# Patient Record
Sex: Male | Born: 1965 | Race: Black or African American | Hispanic: No | Marital: Married | Smoking: Never smoker
Health system: Southern US, Community
[De-identification: ages and names within clinical notes are randomized; demographics above are authoritative.]

## PROBLEM LIST (undated history)

## (undated) DIAGNOSIS — R972 Elevated prostate specific antigen [PSA]: Secondary | ICD-10-CM

## (undated) DIAGNOSIS — R112 Nausea with vomiting, unspecified: Secondary | ICD-10-CM

## (undated) DIAGNOSIS — J45990 Exercise induced bronchospasm: Secondary | ICD-10-CM

## (undated) DIAGNOSIS — N201 Calculus of ureter: Secondary | ICD-10-CM

## (undated) DIAGNOSIS — N529 Male erectile dysfunction, unspecified: Secondary | ICD-10-CM

## (undated) DIAGNOSIS — I10 Essential (primary) hypertension: Secondary | ICD-10-CM

## (undated) DIAGNOSIS — Z87820 Personal history of traumatic brain injury: Secondary | ICD-10-CM

## (undated) DIAGNOSIS — E039 Hypothyroidism, unspecified: Secondary | ICD-10-CM

## (undated) DIAGNOSIS — K219 Gastro-esophageal reflux disease without esophagitis: Secondary | ICD-10-CM

## (undated) DIAGNOSIS — T4145XA Adverse effect of unspecified anesthetic, initial encounter: Secondary | ICD-10-CM

## (undated) DIAGNOSIS — T8859XA Other complications of anesthesia, initial encounter: Secondary | ICD-10-CM

## (undated) DIAGNOSIS — Z9889 Other specified postprocedural states: Secondary | ICD-10-CM

## (undated) DIAGNOSIS — G8929 Other chronic pain: Secondary | ICD-10-CM

## (undated) DIAGNOSIS — N200 Calculus of kidney: Secondary | ICD-10-CM

## (undated) DIAGNOSIS — M549 Dorsalgia, unspecified: Secondary | ICD-10-CM

## (undated) DIAGNOSIS — G4730 Sleep apnea, unspecified: Secondary | ICD-10-CM

## (undated) DIAGNOSIS — Z87442 Personal history of urinary calculi: Secondary | ICD-10-CM

## (undated) HISTORY — PX: CARDIOVASCULAR STRESS TEST: SHX262

## (undated) HISTORY — PX: VARICOCELECTOMY: SHX1084

## (undated) HISTORY — PX: CARDIAC CATHETERIZATION: SHX172

## (undated) HISTORY — PX: PROSTATE BIOPSY: SHX241

---

## 1998-06-28 ENCOUNTER — Ambulatory Visit (HOSPITAL_COMMUNITY): Admission: RE | Admit: 1998-06-28 | Discharge: 1998-06-28 | Payer: Self-pay | Admitting: Internal Medicine

## 1998-07-18 ENCOUNTER — Ambulatory Visit (HOSPITAL_COMMUNITY): Admission: RE | Admit: 1998-07-18 | Discharge: 1998-07-18 | Payer: Self-pay | Admitting: Urology

## 1999-06-05 ENCOUNTER — Ambulatory Visit (HOSPITAL_COMMUNITY): Admission: RE | Admit: 1999-06-05 | Discharge: 1999-06-05 | Payer: Self-pay | Admitting: Internal Medicine

## 1999-08-17 ENCOUNTER — Emergency Department (HOSPITAL_COMMUNITY): Admission: EM | Admit: 1999-08-17 | Discharge: 1999-08-17 | Payer: Self-pay | Admitting: Emergency Medicine

## 1999-08-17 ENCOUNTER — Encounter: Payer: Self-pay | Admitting: Emergency Medicine

## 2001-12-26 ENCOUNTER — Encounter: Admission: RE | Admit: 2001-12-26 | Discharge: 2001-12-26 | Payer: Self-pay | Admitting: Internal Medicine

## 2001-12-26 ENCOUNTER — Encounter: Payer: Self-pay | Admitting: Internal Medicine

## 2002-06-21 ENCOUNTER — Emergency Department (HOSPITAL_COMMUNITY): Admission: EM | Admit: 2002-06-21 | Discharge: 2002-06-21 | Payer: Self-pay | Admitting: Emergency Medicine

## 2002-06-22 ENCOUNTER — Emergency Department (HOSPITAL_COMMUNITY): Admission: EM | Admit: 2002-06-22 | Discharge: 2002-06-22 | Payer: Self-pay | Admitting: Emergency Medicine

## 2002-06-22 ENCOUNTER — Encounter: Payer: Self-pay | Admitting: Emergency Medicine

## 2003-05-19 ENCOUNTER — Emergency Department (HOSPITAL_COMMUNITY): Admission: EM | Admit: 2003-05-19 | Discharge: 2003-05-19 | Payer: Self-pay | Admitting: Emergency Medicine

## 2003-05-19 ENCOUNTER — Encounter: Payer: Self-pay | Admitting: Emergency Medicine

## 2003-08-19 ENCOUNTER — Emergency Department (HOSPITAL_COMMUNITY): Admission: EM | Admit: 2003-08-19 | Discharge: 2003-08-19 | Payer: Self-pay | Admitting: Emergency Medicine

## 2003-11-06 ENCOUNTER — Ambulatory Visit (HOSPITAL_BASED_OUTPATIENT_CLINIC_OR_DEPARTMENT_OTHER): Admission: RE | Admit: 2003-11-06 | Discharge: 2003-11-06 | Payer: Self-pay | Admitting: Internal Medicine

## 2005-03-27 ENCOUNTER — Inpatient Hospital Stay (HOSPITAL_COMMUNITY): Admission: EM | Admit: 2005-03-27 | Discharge: 2005-03-31 | Payer: Self-pay | Admitting: Emergency Medicine

## 2005-06-03 ENCOUNTER — Emergency Department (HOSPITAL_COMMUNITY): Admission: EM | Admit: 2005-06-03 | Discharge: 2005-06-03 | Payer: Self-pay | Admitting: Emergency Medicine

## 2007-05-29 ENCOUNTER — Emergency Department (HOSPITAL_COMMUNITY): Admission: EM | Admit: 2007-05-29 | Discharge: 2007-05-29 | Payer: Self-pay | Admitting: Family Medicine

## 2007-10-16 ENCOUNTER — Emergency Department (HOSPITAL_COMMUNITY): Admission: EM | Admit: 2007-10-16 | Discharge: 2007-10-16 | Payer: Self-pay | Admitting: Emergency Medicine

## 2007-10-19 ENCOUNTER — Emergency Department (HOSPITAL_COMMUNITY): Admission: EM | Admit: 2007-10-19 | Discharge: 2007-10-19 | Payer: Self-pay | Admitting: Family Medicine

## 2008-05-07 ENCOUNTER — Emergency Department (HOSPITAL_COMMUNITY): Admission: EM | Admit: 2008-05-07 | Discharge: 2008-05-07 | Payer: Self-pay | Admitting: Family Medicine

## 2009-09-08 ENCOUNTER — Emergency Department (HOSPITAL_COMMUNITY): Admission: EM | Admit: 2009-09-08 | Discharge: 2009-09-08 | Payer: Self-pay | Admitting: Emergency Medicine

## 2010-02-08 ENCOUNTER — Emergency Department (HOSPITAL_COMMUNITY): Admission: EM | Admit: 2010-02-08 | Discharge: 2010-02-08 | Payer: Self-pay | Admitting: Family Medicine

## 2010-06-28 ENCOUNTER — Encounter (INDEPENDENT_AMBULATORY_CARE_PROVIDER_SITE_OTHER): Payer: Self-pay | Admitting: Emergency Medicine

## 2010-06-28 ENCOUNTER — Emergency Department (HOSPITAL_COMMUNITY)
Admission: EM | Admit: 2010-06-28 | Discharge: 2010-06-28 | Payer: Self-pay | Source: Home / Self Care | Admitting: Emergency Medicine

## 2010-08-20 ENCOUNTER — Encounter: Payer: Self-pay | Admitting: Internal Medicine

## 2010-08-30 ENCOUNTER — Encounter: Payer: Self-pay | Admitting: Internal Medicine

## 2010-08-31 ENCOUNTER — Ambulatory Visit (INDEPENDENT_AMBULATORY_CARE_PROVIDER_SITE_OTHER): Payer: Commercial Managed Care - PPO

## 2010-08-31 DIAGNOSIS — J019 Acute sinusitis, unspecified: Secondary | ICD-10-CM

## 2010-10-10 LAB — POCT I-STAT, CHEM 8
BUN: 10 mg/dL (ref 6–23)
Calcium, Ion: 1.2 mmol/L (ref 1.12–1.32)
Creatinine, Ser: 1.2 mg/dL (ref 0.4–1.5)
TCO2: 30 mmol/L (ref 0–100)

## 2010-10-10 LAB — DIFFERENTIAL
Eosinophils Absolute: 0.1 10*3/uL (ref 0.0–0.7)
Eosinophils Relative: 1 % (ref 0–5)
Lymphs Abs: 1.9 10*3/uL (ref 0.7–4.0)
Monocytes Absolute: 0.3 10*3/uL (ref 0.1–1.0)

## 2010-10-10 LAB — CBC
Hemoglobin: 13.3 g/dL (ref 13.0–17.0)
MCH: 26.3 pg (ref 26.0–34.0)
MCHC: 32.6 g/dL (ref 30.0–36.0)
RDW: 17.5 % — ABNORMAL HIGH (ref 11.5–15.5)

## 2010-12-15 NOTE — Consult Note (Signed)
NAME:  Nicholas Mcintosh, Nicholas Mcintosh             ACCOUNT NO.:  [id number]   MEDICAL RECORD NO.:  [id number]          PATIENT TYPE:  INP   LOCATION:  0152                         FACILITY:  [facility]   PHYSICIAN:  Marlan Palau, M.D.  DATE OF BIRTH:  Aug 04, 1965   DATE OF CONSULTATION:  03/28/2005  DATE OF DISCHARGE:                                   CONSULTATION   HISTORY OF PRESENT ILLNESS:  Nicholas Mcintosh is a 45 year old black male  with a history of a work related event associated with a fall while up on a  ladder caulking a window.  The patient recalls nothing of the actual fall  and likely lost consciousness with the event.  Since that time, the patient  has had a severe headache and vertigo.  A CT scan of the head initially  looked unremarkable.  An MRI scan of the brain was done and shows no  evidence of contusions. The study is unremarkable.  The patient has been  getting pain medications of Darvocet, taking two every four hours or so  during this hospitalization.  The patient has also complained of a  significant amount of vertigo.  The patient gets Phenergan as needed for  nausea.  Around lunch time today, the patient was noted to be somewhat  lethargic and difficult to arouse.  The patient will respond when stimulated  but will fall asleep fairly rapidly if left alone.  Neurology was asked to  see this patient for further evaluation of the altered mental status.  The  patient reports no focal numbness or weakness on the arms or legs.   PAST MEDICAL HISTORY:  Significant for:  1.  History of a fall with a concussion.  2.  Post traumatic headache.  3.  Post traumatic vertigo.  4.  Hypertension.   ALLERGIES:  NO KNOWN ALLERGIES.   HABITS:  The patient does not smoke or drink.   MEDICATIONS:  He was on no medications prior to admission.  Currently he is  on:  1.  Flexeril 5 mg three times a day as needed.  2.  Phenergan 25 mg every 3 hours as needed.  3.  Darvocet one-two tablets  every four hours as needed.   SOCIAL HISTORY:  The patient is married and lives in the [place] area.  He has four children who are alive and well.  The patient works in  maintenance.   FAMILY MEDICAL HISTORY:  It is noted that the mother and father are both  alive but separated.  Mother has a history of hypertension.  Maternal  grandmother had heart disease.  The patient is an only child.  No family  history of cancer or diabetes is noted.   REVIEW OF SYSTEMS:  Notable for no recent fevers or chills.  The patient  does note a headache and vertigo.  He denies shortness of breath or chest  pain.  He does have some nausea.  He denies any focal numbness or weakness  on the face, arms, or legs.  The patient denies any double vision or loss of  vision.   PHYSICAL  EXAMINATION:  VITAL SIGNS:  Blood pressure is currently 152/84,  heart rate 86, respiratory rate 16, temperature afebrile.  GENERAL:  This patient is a fairly well-developed white male who is sleepy  but can be aroused at the time of examination.  The patient is oriented to  person, place, month, and year.  HEENT:  Head is atraumatic.  Eyes, pupils equal, round, and reactive to  light.  Disks soft, flat bilaterally.  NECK:  Supple.  No carotid bruits noted.  RESPIRATORY:  Clear.  CARDIOVASCULAR:  Reveals a regular rate and rhythm.  No obvious murmurs or  rubs noted.  EXTREMITIES:  Without significant edema.  NEUROLOGIC:  Cranial nerves as above.  The patient has good pinprick  sensation of the face.  He has good full extra ocular movements.  Possibly  some end gaze nystagmus.  Visual fields are full.  Pupils again are round  and reactive to light.  Disks are flat.  The patient had good strength in  all fours.  Good symmetric motor tone and strength throughout.  Sensory  testing is intact to pinprick, soft touch, vibratory sensation throughout.  The patient had fairly normal finger-to-nose and toe-to-finger bilaterally.  He  was not ambulated.  Deep tendon reflexes remain symmetric and normal.  Toes downgoing bilaterally.   LABORATORY VALUES:  Notable for a sodium of 136, potassium 3.6, chloride of  103, CO2 of 28, glucose of 112, BUN 12, creatinine 1.0.  Total bilirubin  0.7, alkaline phosphatase of 50, SGOT of 27, SGPT of 34, total protein 6.6,  albumin of 3.4, calcium 8.5.  Drug screen was negative.  Urinalysis was  unremarkable.   An MRI of the brain was as above.   IMPRESSION:  1.  History of altered mental status encephalopathy.  2.  Status post fall with a head injury and concussion.  3.  Post traumatic vertigo.  4.  Post traumatic headaches.   PLAN:  This patient has had significant problems with headaches and vertigo  since the fall.  An MRI scan of the brain suggests no brain contusion.  A CT  and MRI scan were done on the day of admission.  The examination appears  that the patient is simply sleeping.  He can be aroused and once fully  alerted, responds well.  The patient may be over sedated with pain  medications and this should probably be cut back quite a bit.  The patient  will be reevaluated to rule out a subdural hematoma, which can present in a  similar fashion and the onset may be delayed from the time of the head  injury.  We will check an EEG study to rule out a subclinical seizure event.  I suspect the patient will recover if sedative medications are used  sparingly.  We will follow the patient's clinical course while in house.      Marlan Palau, M.D.  Electronically Signed     CKW/MEDQ  D:  03/28/2005  T:  03/28/2005  Job:  [id number]   cc:   Minerva Areola L. August Saucer, M.D.  [address] [address]  [place]  [place] [address]  Fax: [phone]

## 2010-12-15 NOTE — Discharge Summary (Signed)
NAME:  Nicholas Mcintosh, Nicholas Mcintosh             ACCOUNT NO.:  [id number]   MEDICAL RECORD NO.:  [id number]          PATIENT TYPE:  INP   LOCATION:  1410                         [facility]ACILITY:  [facility]   PHYSICIAN:  Mohan N. Sharyn Lull, M.D. DATE OF BIRTH:  [birth year]   DATE OF ADMISSION:  03/26/2005  DATE OF DISCHARGE:  03/31/2005                                 DISCHARGE SUMMARY   ADMISSION DIAGNOSES:  1.  Status post fall with head injury and concussion.  2.  History of altered mental status secondary to above.  3.  Post traumatic headache.  4.  Post traumatic vertigo.  5.  History of asthma.  6.  History of allergic rhinitis.   DISCHARGE DIAGNOSES:  1.  Status post fall with head trauma and concussion.  2.  Status post post-traumatic headache.  3.  Post traumatic vertigo.  4.  Borderline hypertension.  5.  Glucose intolerance.  6.  History of bronchial asthma.  7.  History of allergic rhinitis.   DISCHARGE MEDICATIONS:  1.  Vicodin 5/500 one tablet every four to six hours as needed.  2.  __________ 25 mg one tablet every eight hours as needed.  3.  Flexeril 10 mg half tablet every eight hours as needed for muscle spasm.   FOLLOW UP:  With Dr. Willey Blade in one week.  Follow up with Dr. Lesia Sago  in two weeks.   DISCHARGE INSTRUCTIONS:  He has been advised to go to the ER if he develops  any severe headache, nausea or vomiting, blurring of vision.  Should call  EMS and go to ER immediately.   DIET:  Low-salt, low-cholesterol.  The patient has been advised to avoid  sweets.   ACTIVITY:  The patient has been advised to avoid driving for one week.   CONDITION ON DISCHARGE:  Stable.   BRIEF HISTORY AND HOSPITAL COURSE:  Mr. Nicholas Mcintosh is a 45 year old black male  with past medical history of significant borderline hypertension, history of  bronchial asthma, allergic rhinitis, was admitted on August 28 following a  fall from the ladder when caulking windows. The patient recalls nothing of  the actual fall and likely lost consciousness with the event.  Since that  time the patient has had severe headache and vertigo.  A CT scan of the head  initially looked unremarkable.  MRI scan of the brain was done and showed no  evidence of contusion which was also negative.  The patient initially was  admitted for 24-hour observation.  On August 29, the patient became somewhat  lethargic and difficult to arouse.  Neurologic consultation was obtained for  evaluation of altered mental status.  The patient reports no focal numbness  or weakness of the arms or legs.   PAST MEDICAL HISTORY:  As above.   ALLERGIES:  No known drug allergies.   HABITS:  The patient does not smoke or drink.   MEDICATIONS:  None at home.  The patient currently was started on Flexeril,  Phenergan and Darvocet.   SOCIAL HISTORY:  The patient is married.  Lives in [place].  Has four  children.  Works in [employer].   FAMILY HISTORY:  Mother is hypertensive.  Maternal grandmother had heart  problems.  The patient is an only child.  No family history of cancer or  diabetes.   PHYSICAL EXAMINATION:  VITAL SIGNS:  Blood pressure 152/84, pulse 86,  afebrile, respiratory rate 16.  GENERAL:  The patient was sleepy but could be aroused at the time of the  examination.  The patient was oriented to person, place, month, and year.  HEENT:  Head was atraumatic.  Eyes:  Pupils are equal, round and reactive to  light.  Disks were flat bilaterally.  NECK:  Supple.  No carotid bruits.  LUNGS:  Clear to auscultation.  HEART:  S1 and S2 was normal.  There was no obvious murmur or rub.  EXTREMITIES:  Without significant edema.  NEUROLOGIC:  Grossly intact except for drowsiness.   LABORATORY DATA:  EKG shows sinus bradycardia.  CT scan of the brain showed  no acute intracranial findings.  X-ray of C-spine showed cervical thoracic  junction not well seen despite __________ not visualized.  Alignment was  grossly intact.   There was reversible abnormal cervical lordosis.  Hemoglobin 13.1, hematocrit 39.5, white count 6.4.  Sodium 124, potassium 4,  chloride 106, bicarb 27, glucose 102, BUN 13, creatinine 1.1.  Liver enzymes  were normal.  CT scan of the brain done on August 28 showed no intracranial  abnormality. MRI of the brain done on August 29 showed no acute infarct.  No  evidence of hemorrhage.  Repeat CT scan of the brain done on August 30  showed normal __________.  Also, his hemoglobin A1c was 5.7.   BRIEF HOSPITAL COURSE:  The patient was admitted to intensive care unit for  24-hour observation.  The patient subsequently became more lethargic.  Neurologic consultation was obtained with Dr. Anne Hahn.  The patient  subsequently underwent MRI and repeat CT scan of the brain which were  negative.  The patient continued to have headache with blurring of vision  and vertigo.  A PT consultation was obtained for vestibular rehab.  The  patient's headache and vertigo has gradually improved.  The patient did not  have any further episodes of blurring of vision.  The patient is very eager and anxious to go home. The patient will be  discharged home on the above medications and will be followed up by Dr. Willey Blade in one week and Dr. Lesia Sago in two weeks.  The patient has been  advised to report to the ER if he develops severe headache, nausea,  vomiting, blurring of vision.           ______________________________  Eduardo Osier Sharyn Lull, M.D.     MNH/MEDQ  D:  03/31/2005  T:  03/31/2005  Job:  [id number]   cc:   Minerva Areola L. August Saucer, M.D.  [address] [address]  [place]  [place] [address]  Fax: [phone]   C. Lesia Sago, M.D.  Fax: [phone]

## 2011-04-06 ENCOUNTER — Emergency Department (HOSPITAL_COMMUNITY): Payer: Worker's Compensation

## 2011-04-06 ENCOUNTER — Inpatient Hospital Stay (HOSPITAL_COMMUNITY)
Admission: EM | Admit: 2011-04-06 | Discharge: 2011-04-08 | DRG: 313 | Disposition: A | Discharge status: Home / Self Care | Payer: Worker's Compensation | Source: Ambulatory Visit | Attending: Internal Medicine | Admitting: Internal Medicine

## 2011-04-06 DIAGNOSIS — J45909 Unspecified asthma, uncomplicated: Secondary | ICD-10-CM | POA: Diagnosis present

## 2011-04-06 DIAGNOSIS — R0789 Other chest pain: Secondary | ICD-10-CM | POA: Diagnosis not present

## 2011-04-06 DIAGNOSIS — R079 Chest pain, unspecified: Secondary | ICD-10-CM | POA: Diagnosis not present

## 2011-04-06 DIAGNOSIS — J309 Allergic rhinitis, unspecified: Secondary | ICD-10-CM | POA: Diagnosis present

## 2011-04-06 DIAGNOSIS — E039 Hypothyroidism, unspecified: Secondary | ICD-10-CM | POA: Diagnosis present

## 2011-04-06 DIAGNOSIS — Z87820 Personal history of traumatic brain injury: Secondary | ICD-10-CM

## 2011-04-06 DIAGNOSIS — Z79899 Other long term (current) drug therapy: Secondary | ICD-10-CM

## 2011-04-06 DIAGNOSIS — F411 Generalized anxiety disorder: Secondary | ICD-10-CM | POA: Diagnosis present

## 2011-04-06 DIAGNOSIS — F4310 Post-traumatic stress disorder, unspecified: Secondary | ICD-10-CM | POA: Diagnosis present

## 2011-04-06 DIAGNOSIS — R7309 Other abnormal glucose: Secondary | ICD-10-CM | POA: Diagnosis present

## 2011-04-06 LAB — DIFFERENTIAL
Basophils Absolute: 0 10*3/uL (ref 0.0–0.1)
Lymphocytes Relative: 40 % (ref 12–46)
Monocytes Relative: 7 % (ref 3–12)
Neutro Abs: 3 10*3/uL (ref 1.7–7.7)
Neutrophils Relative %: 51 % (ref 43–77)

## 2011-04-06 LAB — COMPREHENSIVE METABOLIC PANEL
ALT: 33 U/L (ref 0–53)
AST: 35 U/L (ref 0–37)
Albumin: 4.6 g/dL (ref 3.5–5.2)
Calcium: 9.6 mg/dL (ref 8.4–10.5)
Creatinine, Ser: 1.08 mg/dL (ref 0.50–1.35)
Sodium: 141 mEq/L (ref 135–145)
Total Protein: 7.9 g/dL (ref 6.0–8.3)

## 2011-04-06 LAB — CBC
MCV: 77.8 fL — ABNORMAL LOW (ref 78.0–100.0)
Platelets: 214 10*3/uL (ref 150–400)
RBC: 5.09 MIL/uL (ref 4.22–5.81)
RDW: 18.9 % — ABNORMAL HIGH (ref 11.5–15.5)
WBC: 5.9 10*3/uL (ref 4.0–10.5)

## 2011-04-06 LAB — CK TOTAL AND CKMB (NOT AT ARMC)
CK, MB: 4.8 ng/mL — ABNORMAL HIGH (ref 0.3–4.0)
Relative Index: 0.5 (ref 0.0–2.5)
Total CK: 1043 U/L — ABNORMAL HIGH (ref 7–232)

## 2011-04-07 DIAGNOSIS — R0789 Other chest pain: Secondary | ICD-10-CM | POA: Diagnosis present

## 2011-04-07 DIAGNOSIS — E039 Hypothyroidism, unspecified: Secondary | ICD-10-CM | POA: Diagnosis present

## 2011-04-07 DIAGNOSIS — F4310 Post-traumatic stress disorder, unspecified: Secondary | ICD-10-CM | POA: Diagnosis present

## 2011-04-07 DIAGNOSIS — F411 Generalized anxiety disorder: Secondary | ICD-10-CM | POA: Diagnosis present

## 2011-04-07 DIAGNOSIS — R079 Chest pain, unspecified: Secondary | ICD-10-CM | POA: Diagnosis present

## 2011-04-07 LAB — COMPREHENSIVE METABOLIC PANEL
ALT: 27 U/L (ref 0–53)
Alkaline Phosphatase: 44 U/L (ref 39–117)
BUN: 15 mg/dL (ref 6–23)
CO2: 27 mEq/L (ref 19–32)
Calcium: 9.2 mg/dL (ref 8.4–10.5)
GFR calc Af Amer: >60 mL/min (ref >60)
GFR calc non Af Amer: >60 mL/min (ref >60)
Glucose, Bld: 93 mg/dL (ref 70–99)
Sodium: 139 mEq/L (ref 135–145)
Total Protein: 7.1 g/dL (ref 6.0–8.3)

## 2011-04-07 LAB — CARDIAC PANEL(CRET KIN+CKTOT+MB+TROPI)
CK, MB: 4.7 ng/mL — ABNORMAL HIGH (ref 0.3–4.0)
Relative Index: 0.5 (ref 0.0–2.5)
Relative Index: 0.5 (ref 0.0–2.5)
Relative Index: 0.5 (ref 0.0–2.5)
Total CK: 1011 U/L — ABNORMAL HIGH (ref 7–232)
Troponin I: <0.3 ng/mL (ref <0.30)
Troponin I: <0.3 ng/mL (ref <0.30)

## 2011-04-07 LAB — CBC
HCT: 37.4 % — ABNORMAL LOW (ref 39.0–52.0)
Hemoglobin: 12.4 g/dL — ABNORMAL LOW (ref 13.0–17.0)
MCH: 25.9 pg — ABNORMAL LOW (ref 26.0–34.0)
MCHC: 33.2 g/dL (ref 30.0–36.0)
RBC: 4.78 MIL/uL (ref 4.22–5.81)

## 2011-04-09 LAB — POCT I-STAT TROPONIN I: Troponin i, poc: 0 ng/mL (ref 0.00–0.08)
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
## 2011-05-28 ENCOUNTER — Other Ambulatory Visit (HOSPITAL_COMMUNITY): Payer: Self-pay | Admitting: Cardiology

## 2011-06-15 ENCOUNTER — Encounter (HOSPITAL_COMMUNITY)
Admission: RE | Admit: 2011-06-15 | Discharge: 2011-06-15 | Disposition: A | Discharge status: Home / Self Care | Payer: 59 | Source: Ambulatory Visit | Attending: Cardiology | Admitting: Cardiology

## 2011-06-15 ENCOUNTER — Ambulatory Visit (HOSPITAL_COMMUNITY)
Admission: RE | Admit: 2011-06-15 | Discharge: 2011-06-15 | Disposition: A | Discharge status: Home / Self Care | Payer: 59 | Source: Ambulatory Visit | Attending: Cardiology | Admitting: Cardiology

## 2011-06-15 ENCOUNTER — Encounter (HOSPITAL_COMMUNITY): Payer: Self-pay

## 2011-06-15 DIAGNOSIS — J45909 Unspecified asthma, uncomplicated: Secondary | ICD-10-CM | POA: Insufficient documentation

## 2011-06-15 DIAGNOSIS — R0602 Shortness of breath: Secondary | ICD-10-CM | POA: Diagnosis not present

## 2011-06-15 DIAGNOSIS — I10 Essential (primary) hypertension: Secondary | ICD-10-CM | POA: Insufficient documentation

## 2011-06-15 DIAGNOSIS — R9431 Abnormal electrocardiogram [ECG] [EKG]: Secondary | ICD-10-CM | POA: Insufficient documentation

## 2011-06-15 DIAGNOSIS — I498 Other specified cardiac arrhythmias: Secondary | ICD-10-CM | POA: Insufficient documentation

## 2011-06-15 DIAGNOSIS — R079 Chest pain, unspecified: Secondary | ICD-10-CM | POA: Diagnosis present

## 2011-06-15 DIAGNOSIS — R0789 Other chest pain: Secondary | ICD-10-CM | POA: Insufficient documentation

## 2011-06-15 HISTORY — DX: Essential (primary) hypertension: I10

## 2011-06-15 HISTORY — DX: Hypothyroidism, unspecified: E03.9

## 2011-06-15 MED ORDER — TECHNETIUM TC 99M TETROFOSMIN IV KIT
10.0000 | PACK | Freq: Once | INTRAVENOUS | Status: AC | PRN
  Administered 2011-06-15: 10 via INTRAVENOUS

## 2011-06-15 MED ORDER — TECHNETIUM TC 99M TETROFOSMIN IV KIT
30.0000 | PACK | Freq: Once | INTRAVENOUS | Status: AC | PRN
  Administered 2011-06-15: 30 via INTRAVENOUS

## 2011-07-12 ENCOUNTER — Encounter (HOSPITAL_BASED_OUTPATIENT_CLINIC_OR_DEPARTMENT_OTHER)
Admission: RE | Disposition: A | Discharge status: Home / Self Care | Payer: Self-pay | Source: Ambulatory Visit | Attending: Cardiology

## 2011-07-12 ENCOUNTER — Inpatient Hospital Stay (HOSPITAL_BASED_OUTPATIENT_CLINIC_OR_DEPARTMENT_OTHER)
Admission: RE | Admit: 2011-07-12 | Discharge: 2011-07-12 | Disposition: A | Discharge status: Home / Self Care | Payer: 59 | Source: Ambulatory Visit | Attending: Cardiology | Admitting: Cardiology

## 2011-07-12 ENCOUNTER — Encounter (HOSPITAL_BASED_OUTPATIENT_CLINIC_OR_DEPARTMENT_OTHER): Payer: Self-pay | Admitting: Cardiology

## 2011-07-12 DIAGNOSIS — R079 Chest pain, unspecified: Secondary | ICD-10-CM | POA: Diagnosis not present

## 2011-07-12 DIAGNOSIS — I10 Essential (primary) hypertension: Secondary | ICD-10-CM | POA: Insufficient documentation

## 2011-07-12 DIAGNOSIS — E039 Hypothyroidism, unspecified: Secondary | ICD-10-CM | POA: Insufficient documentation

## 2011-07-12 DIAGNOSIS — R9439 Abnormal result of other cardiovascular function study: Secondary | ICD-10-CM | POA: Diagnosis not present

## 2011-07-12 DIAGNOSIS — R0602 Shortness of breath: Secondary | ICD-10-CM | POA: Insufficient documentation

## 2011-07-12 DIAGNOSIS — R110 Nausea: Secondary | ICD-10-CM | POA: Insufficient documentation

## 2011-07-12 DIAGNOSIS — F411 Generalized anxiety disorder: Secondary | ICD-10-CM | POA: Insufficient documentation

## 2011-07-12 DIAGNOSIS — I209 Angina pectoris, unspecified: Secondary | ICD-10-CM | POA: Diagnosis present

## 2011-07-12 SURGERY — JV LEFT HEART CATHETERIZATION WITH CORONARY ANGIOGRAM
Anesthesia: Moderate Sedation

## 2011-07-12 MED ORDER — SODIUM CHLORIDE 0.9 % IV SOLN: 1.0000 mL/kg/h | INTRAVENOUS | Status: DC

## 2011-07-12 MED ORDER — ACETAMINOPHEN 325 MG PO TABS
650.0000 mg | ORAL_TABLET | ORAL | Status: DC | PRN
  Administered 2011-07-12: 650 mg via ORAL

## 2011-07-12 MED ORDER — DIAZEPAM 5 MG PO TABS
5.0000 mg | ORAL_TABLET | Freq: Once | ORAL | Status: AC
  Administered 2011-07-12: 5 mg via ORAL

## 2011-07-12 MED ORDER — ONDANSETRON HCL 4 MG/2ML IJ SOLN: 4.0000 mg | Freq: Four times a day (QID) | INTRAMUSCULAR | Status: DC | PRN

## 2011-07-12 MED ORDER — OXYCODONE-ACETAMINOPHEN 5-325 MG PO TABS: 1.0000 | ORAL_TABLET | ORAL | Status: DC | PRN

## 2011-07-12 MED ORDER — SODIUM CHLORIDE 0.9 % IV SOLN
INTRAVENOUS | Status: DC
  Administered 2011-07-12: 07:00:00 via INTRAVENOUS

## 2011-07-12 NOTE — Op Note (Signed)
Cardiac cath note dictated on 07/12/2011 dictation number is [phone]

## 2011-07-12 NOTE — Progress Notes (Signed)
Ambulated to bathroom without difficulty or bleeding from right groin site.  Discharge instructions completed.  Discharged to home via wheelchair with wife.
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
## 2011-07-12 NOTE — Progress Notes (Signed)
Bedrest begins @ 0830. 

## 2011-11-14 ENCOUNTER — Emergency Department (HOSPITAL_COMMUNITY)
Admission: EM | Admit: 2011-11-14 | Discharge: 2011-11-14 | Disposition: A | Discharge status: Home / Self Care | Payer: 59 | Source: Home / Self Care | Attending: Emergency Medicine | Admitting: Emergency Medicine

## 2011-11-14 ENCOUNTER — Encounter (HOSPITAL_COMMUNITY): Payer: Self-pay | Admitting: Emergency Medicine

## 2011-11-14 DIAGNOSIS — G8929 Other chronic pain: Secondary | ICD-10-CM

## 2011-11-14 DIAGNOSIS — M545 Low back pain: Secondary | ICD-10-CM

## 2011-11-14 MED ORDER — KETOROLAC TROMETHAMINE 60 MG/2ML IM SOLN
INTRAMUSCULAR | Status: AC
  Filled 2011-11-14: qty 2

## 2011-11-14 MED ORDER — KETOROLAC TROMETHAMINE 60 MG/2ML IM SOLN: 60.0000 mg | Freq: Once | INTRAMUSCULAR | Status: DC

## 2011-11-14 NOTE — Discharge Instructions (Signed)
Back Exercises Back exercises help treat and prevent back injuries. The goal of back exercises is to increase the strength of your abdominal and back muscles and the flexibility of your back. These exercises should be started when you no longer have back pain. Back exercises include:  Pelvic Tilt. Lie on your back with your knees bent. Tilt your pelvis until the lower part of your back is against the floor. Hold this position 5 to 10 sec and repeat 5 to 10 times.   Knee to Chest. Pull first 1 knee up against your chest and hold for 20 to 30 seconds, repeat this with the other knee, and then both knees. This may be done with the other leg straight or bent, whichever feels better.   Sit-Ups or Curl-Ups. Bend your knees 90 degrees. Start with tilting your pelvis, and do a partial, slow sit-up, lifting your trunk only 30 to 45 degrees off the floor. Take at least 2 to 3 seconds for each sit-up. Do not do sit-ups with your knees out straight. If partial sit-ups are difficult, simply do the above but with only tightening your abdominal muscles and holding it as directed.   Hip-Lift. Lie on your back with your knees flexed 90 degrees. Push down with your feet and shoulders as you raise your hips a couple inches off the floor; hold for 10 seconds, repeat 5 to 10 times.   Back arches. Lie on your stomach, propping yourself up on bent elbows. Slowly press on your hands, causing an arch in your low back. Repeat 3 to 5 times. Any initial stiffness and discomfort should lessen with repetition over time.   Shoulder-Lifts. Lie face down with arms beside your body. Keep hips and torso pressed to floor as you slowly lift your head and shoulders off the floor.  Do not overdo your exercises, especially in the beginning. Exercises may cause you some mild back discomfort which lasts for a few minutes; however, if the pain is more severe, or lasts for more than 15 minutes, do not continue exercises until you see your  caregiver. Improvement with exercise therapy for back problems is slow.  See your caregivers for assistance with developing a proper back exercise program. Document Released: 08/23/2004 Document Revised: 07/05/2011 Document Reviewed: 07/16/2005 [employer] Patient Information 2012 [employer]. 
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
## 2011-11-14 NOTE — ED Notes (Signed)
Pt had a fall in 2006 that has caused residual back pain he has never overcome. 2 days ago pain increased and it is radiating down his legs causing severe pain and difficulty walking. Pt takes percocet which is not helping.

## 2011-11-15 ENCOUNTER — Emergency Department (INDEPENDENT_AMBULATORY_CARE_PROVIDER_SITE_OTHER)
Admission: EM | Admit: 2011-11-15 | Discharge: 2011-11-15 | Disposition: A | Discharge status: Home / Self Care | Payer: 59 | Source: Home / Self Care | Attending: Emergency Medicine | Admitting: Emergency Medicine

## 2011-11-15 ENCOUNTER — Encounter (HOSPITAL_COMMUNITY): Payer: Self-pay | Admitting: Emergency Medicine

## 2011-11-15 DIAGNOSIS — G8929 Other chronic pain: Secondary | ICD-10-CM

## 2011-11-15 DIAGNOSIS — M549 Dorsalgia, unspecified: Secondary | ICD-10-CM

## 2011-11-15 MED ORDER — KETOROLAC TROMETHAMINE 60 MG/2ML IM SOLN
INTRAMUSCULAR | Status: AC
  Filled 2011-11-15: qty 2

## 2011-11-15 MED ORDER — KETOROLAC TROMETHAMINE 60 MG/2ML IM SOLN
60.0000 mg | Freq: Once | INTRAMUSCULAR | Status: AC
  Administered 2011-11-15: 60 mg via INTRAMUSCULAR

## 2011-11-15 NOTE — Discharge Instructions (Signed)
Follow up with your regular doctor for continued management. Return to care should your symptoms not improve, or worsen in any way.

## 2011-11-15 NOTE — ED Notes (Signed)
C/o back pain for 3 days
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
## 2011-11-26 ENCOUNTER — Other Ambulatory Visit (HOSPITAL_COMMUNITY): Payer: Self-pay | Admitting: Sports Medicine

## 2011-11-26 DIAGNOSIS — Z8739 Personal history of other diseases of the musculoskeletal system and connective tissue: Secondary | ICD-10-CM

## 2011-12-04 ENCOUNTER — Inpatient Hospital Stay (HOSPITAL_COMMUNITY): Admission: RE | Admit: 2011-12-04 | Payer: 59 | Source: Ambulatory Visit

## 2011-12-21 ENCOUNTER — Emergency Department (HOSPITAL_COMMUNITY)
Admission: EM | Admit: 2011-12-21 | Discharge: 2011-12-21 | Disposition: A | Discharge status: Home / Self Care | Payer: 59 | Source: Home / Self Care | Attending: Emergency Medicine | Admitting: Emergency Medicine

## 2011-12-21 ENCOUNTER — Encounter (HOSPITAL_COMMUNITY): Payer: Self-pay | Admitting: *Deleted

## 2011-12-21 DIAGNOSIS — G8929 Other chronic pain: Secondary | ICD-10-CM

## 2011-12-21 MED ORDER — METAXALONE 800 MG PO TABS: 800.0000 mg | ORAL_TABLET | Freq: Three times a day (TID) | ORAL | Status: AC

## 2011-12-21 MED ORDER — DICLOFENAC SODIUM 75 MG PO TBEC: 75.0000 mg | DELAYED_RELEASE_TABLET | Freq: Two times a day (BID) | ORAL | Status: AC

## 2011-12-21 MED ORDER — KETOROLAC TROMETHAMINE 30 MG/ML IJ SOLN
60.0000 mg | Freq: Once | INTRAMUSCULAR | Status: AC
  Administered 2011-12-21: 60 mg via INTRAMUSCULAR

## 2011-12-21 MED ORDER — KETOROLAC TROMETHAMINE 60 MG/2ML IM SOLN
INTRAMUSCULAR | Status: AC
  Filled 2011-12-21: qty 2

## 2011-12-21 MED ORDER — LORAZEPAM 2 MG/ML IJ SOLN
1.0000 mg | Freq: Once | INTRAMUSCULAR | Status: AC
  Administered 2011-12-21: 1 mg via INTRAMUSCULAR

## 2011-12-21 MED ORDER — PREDNISONE 20 MG PO TABS: ORAL_TABLET | ORAL | Status: AC

## 2011-12-21 MED ORDER — LORAZEPAM 2 MG/ML IJ SOLN
INTRAMUSCULAR | Status: AC
  Filled 2011-12-21: qty 1

## 2011-12-21 NOTE — ED Provider Notes (Signed)
History     CSN: [id number]  Arrival date & time 12/21/11  1800   First MD Initiated Contact with Patient 12/21/11 1832      Chief Complaint  Patient presents with  . Back Pain    (Consider location/radiation/quality/duration/timing/severity/associated sxs/prior treatment) HPI Comments: Patient long history of back pain s/p fall in 2006 presents with an acute flare of his chronic pain starting earlier today while he was lifting something. States this is identical to his usual pain. Has chronic decreased sensation over his lateral left thigh, states is unchanged today.  He takes Mobic, soma when necessary, and Percocet 5/325  on a regular basis. He took the Mobic and soma last night. He tried his home dose of Percocet without relief. He called his Dr. August Saucer, his PMD, and was told to come to urgent care for a "shot". He's been seen here twice in April for the same, given Toradol both times with improvement.  No fevers, h/o trauma with bony tenderness, neurological deficits, bladder/ bowel incontinence, h/o CA, unexplained weight loss, pain worse at night,  h/o prolonged steroid use, h/o osteopenia, h/o IVDU.   ROS as noted in HPI. All other ROS negative.   Patient is a 46 y.o. male presenting with back pain. The history is provided by the patient. No language interpreter was used.  Back Pain  This is a recurrent problem. The current episode started 6 to 12 hours ago. The problem occurs constantly. The problem has not changed since onset.The pain is associated with lifting heavy objects. The pain is present in the lumbar spine. The quality of the pain is described as aching. The pain does not radiate. The symptoms are aggravated by bending and twisting. The pain is worse during the day. Pertinent negatives include no chest pain, no fever, no abdominal pain, no abdominal swelling, no bowel incontinence, no perianal numbness, no bladder incontinence, no dysuria, no pelvic pain, no leg pain, no  paresthesias, no paresis, no tingling and no weakness. He has tried analgesics, NSAIDs and muscle relaxants for the symptoms. The treatment provided no relief.    Past Medical History  Diagnosis Date  . Asthma   . Hypertension   . Hypothyroidism   . New-onset angina 07/12/2011  . Back pain     Secondary to 2-story fall 2006    Past Surgical History  Procedure Date  . Surgery on scrotum   . Cardiac catheterization 2012    History reviewed. No pertinent family history.  History  Substance Use Topics  . Smoking status: Never Smoker   . Smokeless tobacco: Not on file  . Alcohol Use: No      Review of Systems  Constitutional: Negative for fever.  Cardiovascular: Negative for chest pain.  Gastrointestinal: Negative for abdominal pain and bowel incontinence.  Genitourinary: Negative for bladder incontinence, dysuria and pelvic pain.  Musculoskeletal: Positive for back pain.  Neurological: Negative for tingling, weakness and paresthesias.    Allergies  Review of patient's allergies indicates no known allergies.  Home Medications   Current Outpatient Rx  Name Route Sig Dispense Refill  . EXFORGE PO Oral Take by mouth.    Marland Kitchen GABAPENTIN 400 MG PO CAPS Oral Take 400 mg by mouth 3 (three) times daily.    . METHYLTESTOSTERONE 10 MG PO CAPS Oral Take 10 mg by mouth daily.    . OXYCODONE-ACETAMINOPHEN 5-325 MG PO TABS Oral Take 1 tablet by mouth every 4 (four) hours as needed.    Marland Kitchen DICLOFENAC  SODIUM 75 MG PO TBEC Oral Take 1 tablet (75 mg total) by mouth 2 (two) times daily. Take with food 30 tablet 0  . METAXALONE 800 MG PO TABS Oral Take 1 tablet (800 mg total) by mouth 3 (three) times daily. 21 tablet 0  . PREDNISONE 20 MG PO TABS  Take 3 tabs po on first day, 2 tabs second day, 2 tabs third day, 1 tab fourth day, 1 tab 5th day. Take with food. 9 tablet 0    BP 137/83  Pulse 64  Temp(Src) 98.8 F (37.1 C) (Oral)  Resp 16  SpO2 99%  Physical Exam  Nursing note and  vitals reviewed. Constitutional: He is oriented to person, place, and time. He appears well-developed and well-nourished. He appears distressed.       Appears uncomfortable  HENT:  Head: Normocephalic and atraumatic.  Eyes: Conjunctivae and EOM are normal.  Neck: Normal range of motion.  Cardiovascular: Normal rate, regular rhythm, normal heart sounds and intact distal pulses.   Pulmonary/Chest: Effort normal and breath sounds normal.  Abdominal: He exhibits no distension. There is no tenderness. There is no CVA tenderness.  Musculoskeletal: Normal range of motion. He exhibits no edema.       Lumbar back: He exhibits tenderness, pain and spasm. He exhibits no bony tenderness.       Back:       Bilateral lower extremities nontender without new rashes or color change, intact  PT pulses. Pain with active flexion of left hip. Patient unable to tolerate lying down. Sensation baseline light touch bilaterally for Pt, DTR's symmetric and intact bilaterally KJ, Motor symmetric bilateral 5/5 hip flexion, quadriceps, hamstrings, EHL, foot dorsiflexion, foot plantarflexion, gait antalgic but without apparent new ataxia.   Neurological: He is alert and oriented to person, place, and time.  Skin: Skin is warm and dry.  Psychiatric: He has a normal mood and affect. His behavior is normal.    ED Course  Procedures (including critical care time)  Labs Reviewed - No data to display No results found.   1. Acute exacerbation of chronic low back pain      MDM  Previous records reviewed. As noted in HPI. He does have a primary care physician, Dr. August Saucer, who prescribes Percocet.Roderic Ovens [place] narcotic database reviewed. Patient given Percocet 11/30/2023 #60 on 4/22. Has regular narcotic rx from Dr. August Saucer. No narcotic rx from other providers  giving Toradol 60 IM, Ativan 1 mg IM as patient appears to be moderately uncomfortable. No evidence of uti, nephrolithiasis. No evidence of spinal cord  involvement based on H&P. Pt describing typical back pain, has been <6 week duration. No red flags.  Imaging not indicated at this time.   Will stop his Mobic, because this is not working for him. We will try diclofenac. will discontinue the soma and try Robaxin or another muscle relaxant. He'll also go home with a short course of steroids. Not sending home with any narcotics. He'll followup with Dr. August Saucer on Monday for ongoing management.  Luiz Blare, MD 12/21/11 2116

## 2011-12-21 NOTE — Discharge Instructions (Signed)
Do not take the Percocet unless you absolutely needed. Take the diclofenac and metaxalone a regular basis. Stop the soma. Return for fever above 100.4, new numbness, weakness, or any other concerns.

## 2011-12-21 NOTE — ED Notes (Signed)
C/O flare-up of chronic back pain today - states he notified Dr. August Saucer, but was told he could not get pt in today for appt & told pt to "come to [facility] for a shot".  Pt states he has taken his Percocet 5mg /325mg  "but it's too far gone; it's not even helping".  Reports being in [facility] twice over past month for same, but "would like something stronger" this time.  States he is trying to reach his wife for a ride home.  Telephone provided.

## 2012-01-02 ENCOUNTER — Inpatient Hospital Stay (HOSPITAL_COMMUNITY): Admission: RE | Admit: 2012-01-02 | Payer: 59 | Source: Ambulatory Visit

## 2012-02-24 ENCOUNTER — Emergency Department (HOSPITAL_COMMUNITY)
Admission: EM | Admit: 2012-02-24 | Discharge: 2012-02-24 | Disposition: A | Discharge status: Home / Self Care | Payer: 59 | Source: Home / Self Care | Attending: Emergency Medicine | Admitting: Emergency Medicine

## 2012-02-24 ENCOUNTER — Encounter (HOSPITAL_COMMUNITY): Payer: Self-pay | Admitting: Emergency Medicine

## 2012-02-24 DIAGNOSIS — G8929 Other chronic pain: Secondary | ICD-10-CM

## 2012-02-24 DIAGNOSIS — M545 Low back pain, unspecified: Secondary | ICD-10-CM

## 2012-02-24 MED ORDER — METHOCARBAMOL 500 MG PO TABS: 500.0000 mg | ORAL_TABLET | Freq: Three times a day (TID) | ORAL | Status: AC

## 2012-02-24 MED ORDER — KETOROLAC TROMETHAMINE 60 MG/2ML IM SOLN
INTRAMUSCULAR | Status: AC
  Filled 2012-02-24: qty 2

## 2012-02-24 MED ORDER — LORAZEPAM 2 MG/ML IJ SOLN
INTRAMUSCULAR | Status: AC
  Filled 2012-02-24: qty 1

## 2012-02-24 MED ORDER — DICLOFENAC SODIUM 75 MG PO TBEC: 75.0000 mg | DELAYED_RELEASE_TABLET | Freq: Two times a day (BID) | ORAL | Status: AC

## 2012-02-24 MED ORDER — LORAZEPAM 2 MG/ML IJ SOLN
1.0000 mg | Freq: Once | INTRAMUSCULAR | Status: AC
  Administered 2012-02-24: 1 mg via INTRAMUSCULAR

## 2012-02-24 MED ORDER — KETOROLAC TROMETHAMINE 60 MG/2ML IM SOLN
60.0000 mg | Freq: Once | INTRAMUSCULAR | Status: AC
  Administered 2012-02-24: 60 mg via INTRAMUSCULAR

## 2012-02-24 NOTE — ED Provider Notes (Signed)
Chief Complaint  Patient presents with  . Back Pain    History of Present Illness:   Nicholas Mcintosh is a 46 year old male whom I have seen him before about 3 months ago for the same issue and moreover, he has been seen by multiple providers here at the [facility] for the same thing. He has had a 4 to five-year history of lower back pain which began after a fall. He did see an orthopedist for this as well. His pain gets better and worse. Has been worse the past 2 days. He rates it 1000 over 10 in intensity. It sometimes radiates to both legs, right now it's not radiating but he does note left leg numbness and weakness. He denies any bladder or bowel symptoms. He denies any systemic symptoms such as fever, chills, or weight loss. He was seen here last in May and medications that he was given at that time by Dr. Terrilee Croak seem to work very well. This included Toradol and Ativan IM and Robaxin and diclofenac by mouth.  Review of Systems:  Other than noted above, the patient denies any of the following symptoms: Systemic:  No fever, chills, fatigue, or weight loss. GI:  No abdominal pain, nausea, vomiting, diarrhea, constipation or blood in stool. GU:  No dysuria, frequency, urgency, or hematuria. No incontinence or difficulty urinating.  M-S:  No neck pain, joint pain, arthritis, or myalgias. Neuro:  No parethesias or muscular weakness. Skin:  No rash or itching.   PMFSH:  Past medical history, family history, social history, meds, and allergies were reviewed.  Physical Exam:   Vital signs:  BP 134/74  Pulse 70  Temp 98.1 F (36.7 C) (Oral)  Resp 18  SpO2 100% General:  Alert, oriented, in no distress. Abdomen:  Soft, non-tender.  No organomegaly or mass.  No pulsatile midline abdominal mass or bruit. Back:  His back is diffusely tender to palpation and has almost 0 range of motion. Straight leg raising is positive bilaterally. Neuro:  Normal muscle strength, sensations and  DTRs. Extremities: Pedal pulses were full, there was no edema. Skin:  Clear, warm and dry.  No rash.   Course in Urgent Care Center:   Since he got good results at his last visit with Toradol and Ativan he was given Toradol 60 mg IM and Ativan 1 mg IM and tolerated this well without any immediate side effects.  Assessment:  The encounter diagnosis was Chronic low back pain.  Plan:   1.  The following meds were prescribed:   New Prescriptions   DICLOFENAC (VOLTAREN) 75 MG EC TABLET    Take 1 tablet (75 mg total) by mouth 2 (two) times daily.   METHOCARBAMOL (ROBAXIN) 500 MG TABLET    Take 1 tablet (500 mg total) by mouth 3 (three) times daily.   2.  The patient was instructed in symptomatic care and handouts were given. 3.  The patient was told to return if becoming worse in any way, if no better in 2 weeks, and given some red flag symptoms that would indicate earlier return. 4.  The patient was encouraged to try to be as active as possible and given some exercises to do followed by moist heat.    Reuben Likes, MD 02/24/12 [phone]

## 2012-02-24 NOTE — ED Notes (Signed)
Pt has a history of back pain since a fall in 2006 with acute exacerbations. Pt was here a couple months ago with an exacerbation that was helped by 2 prescriptions and 2 shots. Pt states the pain is excruciating and he is unable to function with walking being the worst. Pt states he has a very hard time getting into his primary MD August Saucer but finally has an appt this week.

## 2012-02-25 ENCOUNTER — Ambulatory Visit (HOSPITAL_COMMUNITY)
Admission: RE | Admit: 2012-02-25 | Discharge: 2012-02-25 | Disposition: A | Discharge status: Home / Self Care | Payer: 59 | Source: Ambulatory Visit | Attending: Sports Medicine | Admitting: Sports Medicine

## 2012-02-25 DIAGNOSIS — M545 Low back pain, unspecified: Secondary | ICD-10-CM | POA: Insufficient documentation

## 2012-02-25 DIAGNOSIS — Z8739 Personal history of other diseases of the musculoskeletal system and connective tissue: Secondary | ICD-10-CM

## 2012-02-25 DIAGNOSIS — M5126 Other intervertebral disc displacement, lumbar region: Secondary | ICD-10-CM | POA: Insufficient documentation

## 2012-04-01 LAB — CBC AND DIFFERENTIAL
HCT: 32 % — AB (ref 41–53)
Hemoglobin: 9.9 g/dL — AB (ref 13.5–17.5)
Platelets: 400 10*3/uL — AB (ref 150–399)

## 2012-04-01 LAB — PROTIME-INR: Protime: 24.3 seconds — AB (ref 10.0–13.8)

## 2012-04-01 LAB — POCT INR

## 2012-04-24 ENCOUNTER — Emergency Department (HOSPITAL_COMMUNITY)
Admission: EM | Admit: 2012-04-24 | Discharge: 2012-04-24 | Disposition: A | Discharge status: Home / Self Care | Payer: Self-pay | Source: Home / Self Care | Attending: Emergency Medicine | Admitting: Emergency Medicine

## 2012-04-24 ENCOUNTER — Encounter (HOSPITAL_COMMUNITY): Payer: Self-pay | Admitting: *Deleted

## 2012-04-24 DIAGNOSIS — J45909 Unspecified asthma, uncomplicated: Secondary | ICD-10-CM

## 2012-04-24 MED ORDER — FLUTICASONE PROPIONATE HFA 44 MCG/ACT IN AERO: 1.0000 | INHALATION_SPRAY | Freq: Two times a day (BID) | RESPIRATORY_TRACT | Status: DC

## 2012-04-24 MED ORDER — FEXOFENADINE-PSEUDOEPHED ER 60-120 MG PO TB12: 1.0000 | ORAL_TABLET | Freq: Two times a day (BID) | ORAL | Status: DC

## 2012-04-24 MED ORDER — ALBUTEROL SULFATE HFA 108 (90 BASE) MCG/ACT IN AERS: 1.0000 | INHALATION_SPRAY | Freq: Four times a day (QID) | RESPIRATORY_TRACT | Status: DC | PRN

## 2012-04-24 NOTE — ED Notes (Signed)
Pt reports asthma flare up that is not being helped with prescription inhaler - I need something stronger per pt

## 2012-04-24 NOTE — ED Provider Notes (Signed)
History     CSN: [id number]  Arrival date & time 04/24/12  1144   First MD Initiated Contact with Patient 04/24/12 1146      Chief Complaint  Patient presents with  . Asthma    (Consider location/radiation/quality/duration/timing/severity/associated sxs/prior treatment) HPI Comments: Patient presents urgent care complaining of recurrent shortness of breath and wheezing as he describes asthma. He's been using albuterol every 4-6 hours and sometimes up to 4 times a day he obtained some relief but feels his asthma returns after 6 or 8 hours or sometimes even less. Patient describes it he feels this has been aggravated as he was in a garage cleaning last week and has felt that since then his demand for albuterol has increased as a result of increased coughing, shortness of breath, and occasional wheezing. At this point patient denies any sore throat congestion or fevers or constitutional symptoms such as headache myalgias arthralgias. Patient describes he feels he is somewhat congested in his chest and that might be the reason his asthma has flared up. He feels of neutral helps partially but is requesting something "stronger". During his interview and exam patient denies being short of breath now as she has used a albuterol and an inhaler about 3-4 hours ago  Patient is a 46 y.o. male presenting with asthma. The history is provided by the patient.  Asthma This is a recurrent problem. The current episode started more than 1 week ago. The problem occurs constantly. The problem has been gradually worsening. Associated symptoms include shortness of breath. Pertinent negatives include no chest pain and no headaches. Nothing (Partially with albuterol) relieves the symptoms. Treatments tried: Albuterol inhaler. The treatment provided no relief.    Past Medical History  Diagnosis Date  . Asthma   . Hypertension   . Hypothyroidism   . New-onset angina 07/12/2011  . Back pain     Secondary to 2-story  fall 2006    Past Surgical History  Procedure Date  . Surgery on scrotum   . Cardiac catheterization 2012    Family History  Problem Relation Age of Onset  . Family history unknown: Yes    History  Substance Use Topics  . Smoking status: Never Smoker   . Smokeless tobacco: Not on file  . Alcohol Use: No      Review of Systems  Constitutional: Negative for fever, chills, diaphoresis, activity change and appetite change.  HENT: Negative for sore throat, rhinorrhea, trouble swallowing, postnasal drip and sinus pressure.   Respiratory: Positive for cough, chest tightness, shortness of breath and wheezing. Negative for apnea.   Cardiovascular: Negative for chest pain.  Musculoskeletal: Positive for back pain. Negative for myalgias, joint swelling and arthralgias.  Neurological: Negative for dizziness and headaches.    Allergies  Review of patient's allergies indicates no known allergies.  Home Medications   Current Outpatient Rx  Name Route Sig Dispense Refill  . ALBUTEROL SULFATE HFA 108 (90 BASE) MCG/ACT IN AERS Inhalation Inhale 1-2 puffs into the lungs every 6 (six) hours as needed for wheezing. 1 Inhaler 0  . EXFORGE PO Oral Take by mouth.    . DICLOFENAC SODIUM 75 MG PO TBEC Oral Take 1 tablet (75 mg total) by mouth 2 (two) times daily. Take with food 30 tablet 0  . DICLOFENAC SODIUM 75 MG PO TBEC Oral Take 1 tablet (75 mg total) by mouth 2 (two) times daily. 30 tablet 2  . FEXOFENADINE-PSEUDOEPHED ER 60-120 MG PO TB12 Oral Take 1 tablet  by mouth every 12 (twelve) hours. 30 tablet 0  . FLUTICASONE PROPIONATE  HFA 44 MCG/ACT IN AERO Inhalation Inhale 1 puff into the lungs 2 (two) times daily. 1 Inhaler 12  . GABAPENTIN 400 MG PO CAPS Oral Take 400 mg by mouth 3 (three) times daily.    . MELOXICAM 15 MG PO TABS Oral Take 15 mg by mouth daily.    . METHYLTESTOSTERONE 10 MG PO CAPS Oral Take 10 mg by mouth daily.    . OXYCODONE-ACETAMINOPHEN 5-325 MG PO TABS Oral Take 1  tablet by mouth every 4 (four) hours as needed.      BP 146/88  Pulse 62  Temp 98.5 F (36.9 C) (Oral)  Resp 18  SpO2 99%  Physical Exam  Nursing note and vitals reviewed. Constitutional: He appears well-developed and well-nourished. No distress.  Pulmonary/Chest: Effort normal. No accessory muscle usage. No apnea, not tachypneic and not bradypneic. No respiratory distress. He has decreased breath sounds. He has no wheezes. He has no rhonchi. He has no rales.      ED Course  Procedures (including critical care time)  Labs Reviewed - No data to display No results found.   1. Asthma       MDM  Patient with recurrent asthma on a beta agonist bronchodilator along. Describing is an area in which the environmental agent could have exacerbated and trigger his asthma. Today we have discussed other means to improve his asthma maintenance I prescribed him a steroid inhaler for the next 2-[redacted] weeks along with an antihistamine with decongestant such as Allegra-D. Have encouraged patient to followup with primary care Dr. further adjustments are needed or if any changes such as increased shortness of breath, worsening or fevers was advised to return for a second lung exam and perhaps further evaluation if needed. Patient agrees with treatment plan and followup care as instructed        Jimmie Molly, MD 04/24/12 1317

## 2012-09-17 ENCOUNTER — Encounter: Payer: Self-pay | Admitting: Hematology

## 2012-10-17 ENCOUNTER — Telehealth (HOSPITAL_COMMUNITY): Payer: Self-pay

## 2012-10-17 NOTE — Telephone Encounter (Signed)
Left message for call back. Fax to [facility] failed on 09/18/12, therefore call made to patient to have him pick documents up from Dr. Moody Bruins office.

## 2012-10-17 NOTE — Telephone Encounter (Signed)
CM: Received call back from patient, he agreed to picking up the file and delivering it himself. Patient also stated he will not continue to see Dr. August Saucer and questioning how he can change to the new provider. This CM advised office staff to coordinate with patient when he picks up his file.

## 2012-12-17 ENCOUNTER — Ambulatory Visit: Payer: 59 | Admitting: Primary Care

## 2013-01-01 ENCOUNTER — Telehealth: Payer: Self-pay | Admitting: Internal Medicine

## 2013-01-05 ENCOUNTER — Encounter: Payer: Self-pay | Admitting: Internal Medicine

## 2013-01-05 ENCOUNTER — Ambulatory Visit (INDEPENDENT_AMBULATORY_CARE_PROVIDER_SITE_OTHER): Payer: 59 | Admitting: Internal Medicine

## 2013-01-05 VITALS — BP 142/71 | HR 52 | Temp 98.1°F | Ht 67.0 in | Wt 227.0 lb

## 2013-01-05 DIAGNOSIS — M549 Dorsalgia, unspecified: Secondary | ICD-10-CM

## 2013-01-05 DIAGNOSIS — IMO0001 Reserved for inherently not codable concepts without codable children: Secondary | ICD-10-CM

## 2013-01-05 DIAGNOSIS — R5381 Other malaise: Secondary | ICD-10-CM | POA: Insufficient documentation

## 2013-01-05 DIAGNOSIS — E039 Hypothyroidism, unspecified: Secondary | ICD-10-CM | POA: Insufficient documentation

## 2013-01-05 DIAGNOSIS — E349 Endocrine disorder, unspecified: Secondary | ICD-10-CM

## 2013-01-05 DIAGNOSIS — F411 Generalized anxiety disorder: Secondary | ICD-10-CM

## 2013-01-05 DIAGNOSIS — R5383 Other fatigue: Secondary | ICD-10-CM | POA: Insufficient documentation

## 2013-01-05 DIAGNOSIS — S3994XA Unspecified injury of external genitals, initial encounter: Secondary | ICD-10-CM | POA: Insufficient documentation

## 2013-01-05 DIAGNOSIS — E291 Testicular hypofunction: Secondary | ICD-10-CM

## 2013-01-05 DIAGNOSIS — N400 Enlarged prostate without lower urinary tract symptoms: Secondary | ICD-10-CM

## 2013-01-05 DIAGNOSIS — N529 Male erectile dysfunction, unspecified: Secondary | ICD-10-CM

## 2013-01-05 DIAGNOSIS — I10 Essential (primary) hypertension: Secondary | ICD-10-CM | POA: Insufficient documentation

## 2013-01-05 DIAGNOSIS — S3994XS Unspecified injury of external genitals, sequela: Secondary | ICD-10-CM

## 2013-01-05 LAB — CBC WITH DIFFERENTIAL/PLATELET
Basophils Absolute: 0 10*3/uL (ref 0.0–0.1)
Basophils Relative: 1 % (ref 0–1)
HCT: 37.2 % — ABNORMAL LOW (ref 39.0–52.0)
Lymphocytes Relative: 44 % (ref 12–46)
Monocytes Absolute: 0.4 10*3/uL (ref 0.1–1.0)
Neutro Abs: 1.7 10*3/uL (ref 1.7–7.7)
Neutrophils Relative %: 43 % (ref 43–77)
RDW: 19 % — ABNORMAL HIGH (ref 11.5–15.5)
WBC: 3.9 10*3/uL — ABNORMAL LOW (ref 4.0–10.5)

## 2013-01-05 LAB — BASIC METABOLIC PANEL
BUN: 11 mg/dL (ref 6–23)
CO2: 28 mEq/L (ref 19–32)
Calcium: 9 mg/dL (ref 8.4–10.5)
Chloride: 103 mEq/L (ref 96–112)
Creat: 1.02 mg/dL (ref 0.50–1.35)

## 2013-01-05 LAB — TSH: TSH: 1.359 u[IU]/mL (ref 0.350–4.500)

## 2013-01-05 LAB — TESTOSTERONE: Testosterone: 218 ng/dL — ABNORMAL LOW (ref 300–890)

## 2013-01-05 MED ORDER — OXYCODONE-ACETAMINOPHEN 5-325 MG PO TABS: 1.0000 | ORAL_TABLET | ORAL | Status: DC | PRN

## 2013-01-05 MED ORDER — AMLODIPINE BESYLATE-VALSARTAN 5-160 MG PO TABS: 1.0000 | ORAL_TABLET | Freq: Every day | ORAL | Status: DC

## 2013-01-05 MED ORDER — FLUOXYMESTERONE 10 MG PO TABS: 10.0000 mg | ORAL_TABLET | Freq: Every day | ORAL | Status: DC

## 2013-01-05 NOTE — Progress Notes (Signed)
Subjective:    Patient ID: Nicholas Mcintosh, male    DOB: 1965-08-07, 47 y.o.   MRN: [id number]  Hypertension   Pt with HTN, Low testosterone, Chronic back pain, Anxiety disorder and hypothyroidism presents today for follow-up with te above problems. On last visit, labs were ordered however pt did not have the labs drawn as he states that he was having a difficult time with back pain and had limited mobility.  He presents today with ongoing complaints of back pain which is controlled with current pain regimen and per patient is no worse than usual.   Pt has been without his antihypertensive medication for 1 week and denies any headaches or lightheadedness and dizziness.   Pt states that he is still feeling fatigued but denies and palpitations, diarrhea, constipation    Review of Systems  Constitutional: Positive for fatigue.  HENT: Negative.   Eyes: Negative.   Cardiovascular: Negative.   Gastrointestinal: Negative.   Endocrine: Negative.   Genitourinary: Negative.   Musculoskeletal: Positive for back pain.  Skin: Negative.   Neurological: Negative.   Hematological: Negative.   Psychiatric/Behavioral: Negative.        Objective:   Physical Exam  Constitutional: He appears well-developed and well-nourished.  HENT:  Head: Normocephalic and atraumatic.  Eyes: Conjunctivae and EOM are normal. Pupils are equal, round, and reactive to light.  Neck: Normal range of motion. Neck supple.  Cardiovascular: Normal rate and regular rhythm.   Pulmonary/Chest: Effort normal and breath sounds normal.  Abdominal: Soft. Bowel sounds are normal.  Musculoskeletal:  Range of motion normal and extremities. Spinal range of motion not evaluated at patient's request secondary to pain with spinal range of motion and movement at patient's request.          Assessment & Plan:  1. chronic back pain: Patient's prescription for his Percocet is refilled. Patient given Percocet 5/325 mg #120 tabs  which is a 1 month supply. 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 Currently the patient has no new neurological deficits associated with the pain in his back and his pain is adequately controlled on his current pain regimen to  2. Hypertension: The patient has been without his antihypertensive medication for approximately one week. He denies any symptoms of orthostasis. His blood pressure today is within normal limits. The patient was supposed to have his laboratory studies done at the last office visit (to do so. We are reordering his basic metabolic panel on this visit and we'll evaluate for renal function in the context of his medications. Given that his blood pressure appears to be well controlled on this dose will continue on his current dose of exforge.  3. Hypothyroidism the patient gives no symptoms of dysfunctional thyroid. However given this chronic disease and his complaints of fatigue I'm checking a TSH on the patient today.  4. Low testosterone syndrome: The patient has a history of low testosterone and is currently on testosterone supplementation. Will check his testosterone levels today.  5. Erectile dysfunction: The patient continues on Viagra on an as-needed basis.  Labs: Please note all labs ordered today were also previously ordered last visit but not completed. They include the following basic metabolic panel, CBC with differential, TSH and testosterone level. The patient has already eaten today thus we cannot obtain a fasting panel this will be done on his next visit.  Return to clinic:  3 months or as needed.

## 2013-01-05 NOTE — Telephone Encounter (Signed)
Pt had a f/u office visit today and Dr. Ashley Royalty refilled these medications (Androxy & Exforge) at that time.

## 2013-01-07 ENCOUNTER — Telehealth: Payer: Self-pay | Admitting: Internal Medicine

## 2013-01-07 MED ORDER — CARISOPRODOL 350 MG PO TABS: 350.0000 mg | ORAL_TABLET | Freq: Four times a day (QID) | ORAL | Status: DC | PRN

## 2013-01-07 MED ORDER — GABAPENTIN 400 MG PO CAPS: 400.0000 mg | ORAL_CAPSULE | Freq: Three times a day (TID) | ORAL | Status: DC

## 2013-01-07 NOTE — Telephone Encounter (Signed)
Refilled Soma and Gabapentin

## 2013-01-07 NOTE — Telephone Encounter (Signed)
Pt calls requesting refills on his Gabapentin & Soma (wants #60 instead of #30 that was previously prescribed).  Pt states he is completely out of both medications and would like them sent in today if possible.

## 2013-01-19 ENCOUNTER — Telehealth: Payer: Self-pay | Admitting: Hematology

## 2013-01-19 NOTE — Telephone Encounter (Signed)
Pt came to pick up MRI results

## 2013-01-19 NOTE — Telephone Encounter (Signed)
Pt called requesting a copy of his MRI results from July 2013.  Copy made, awaiting pt pickup.

## 2013-01-27 ENCOUNTER — Encounter: Payer: Self-pay | Admitting: Internal Medicine

## 2013-01-27 ENCOUNTER — Ambulatory Visit (INDEPENDENT_AMBULATORY_CARE_PROVIDER_SITE_OTHER): Payer: 59 | Admitting: Internal Medicine

## 2013-01-27 VITALS — BP 177/99 | HR 63 | Temp 98.4°F | Resp 18 | Ht 67.0 in | Wt 230.0 lb

## 2013-01-27 DIAGNOSIS — F329 Major depressive disorder, single episode, unspecified: Secondary | ICD-10-CM

## 2013-01-28 ENCOUNTER — Telehealth: Payer: Self-pay | Admitting: Hematology

## 2013-01-28 ENCOUNTER — Ambulatory Visit (INDEPENDENT_AMBULATORY_CARE_PROVIDER_SITE_OTHER): Payer: 59 | Admitting: Internal Medicine

## 2013-01-28 ENCOUNTER — Ambulatory Visit: Payer: 59 | Admitting: Internal Medicine

## 2013-01-28 VITALS — BP 138/92 | HR 65 | Temp 98.7°F | Resp 16 | Ht 67.0 in | Wt 230.0 lb

## 2013-01-28 DIAGNOSIS — J329 Chronic sinusitis, unspecified: Secondary | ICD-10-CM

## 2013-01-28 MED ORDER — AZITHROMYCIN 250 MG PO TABS: ORAL_TABLET | ORAL | Status: DC

## 2013-01-28 NOTE — Telephone Encounter (Signed)
Pt called c/o a sore throat, congested nose and plugged ears.  Pt stated that this just started last night after yesterdays appointment.  Pt requesting that antibiotics be called in for him.  Pt asked to come in to see Dr Ashley Royalty, as she does not phone in prescriptions without seeing a pt first. Pt agreeable.  To come in today at 1:15pm

## 2013-01-28 NOTE — Progress Notes (Signed)
  Subjective:    Patient ID: Nicholas Mcintosh, male    DOB: 02-27-1966, 47 y.o.   MRN: [id number]  Sinusitis This is a new problem. The current episode started in the past 7 days. The problem is unchanged. There has been no fever. He is experiencing no pain. Associated symptoms include congestion, ear pain, headaches, sinus pressure and a sore throat. Past treatments include nothing.  URI  Associated symptoms include congestion, ear pain, headaches and a sore throat.      Review of Systems  HENT: Positive for ear pain, congestion, sore throat and sinus pressure.   Neurological: Positive for headaches.  All other systems reviewed and are negative.       Objective:   Physical Exam  Constitutional: He appears well-developed and well-nourished.  HENT:  Head: Normocephalic and atraumatic.  Left Ear: External ear normal.  Rt ear with dull tympanic membrane but no discharge.  Eyes: Conjunctivae and EOM are normal. Pupils are equal, round, and reactive to light.  Neck: Normal range of motion. Neck supple.  Cardiovascular: Normal rate and regular rhythm.   Pulmonary/Chest: Effort normal and breath sounds normal. No respiratory distress.  Skin: Skin is warm and dry.  Psychiatric: He has a normal mood and affect. His behavior is normal. Judgment and thought content normal.          Assessment & Plan:  Acute Sinusitis: Pt with symptoms of acute sinusitis however doubt bacterial as patient has no sequela of a bacterial infection. Pt advised to use Saline washes of nostrils, gargle with warm salt water and use symptomatic treatment with Tylenol and Ibuprofen. Pt however, insisting that this is the usual start of a bacterial sinusitis. He states that without antibiotics, he becomes very ill. In light of the long weekend (4 days) due to a holiday I have prescribed a Z-pak and advised patietn tho start the Z-Pak if he has any symptoms of Fever,(> 100.4) or difficulty breathing. He has been  instructed that if he does need to start the medication, he should then call the on call Physician and let them know. Additionally he is to call the office on Monday if symptoms worsen.

## 2013-02-02 ENCOUNTER — Telehealth: Payer: Self-pay | Admitting: Internal Medicine

## 2013-02-02 DIAGNOSIS — M549 Dorsalgia, unspecified: Secondary | ICD-10-CM

## 2013-02-03 MED ORDER — OXYCODONE-ACETAMINOPHEN 5-325 MG PO TABS: 1.0000 | ORAL_TABLET | ORAL | Status: DC | PRN

## 2013-02-03 NOTE — Telephone Encounter (Signed)
Refilled percocet 5/325 # 90 last filled 01/12/13 for a 2 week supply ready for pick up

## 2013-02-13 ENCOUNTER — Ambulatory Visit (HOSPITAL_COMMUNITY): Payer: 59 | Admitting: Psychiatry

## 2013-02-13 ENCOUNTER — Telehealth: Payer: Self-pay | Admitting: Internal Medicine

## 2013-02-13 DIAGNOSIS — N529 Male erectile dysfunction, unspecified: Secondary | ICD-10-CM

## 2013-02-13 DIAGNOSIS — E349 Endocrine disorder, unspecified: Secondary | ICD-10-CM

## 2013-02-16 NOTE — Progress Notes (Signed)
  Subjective:    Patient ID: Nicholas Mcintosh, male    DOB: 1966/02/26, 47 y.o.   MRN: [id number]  HPI; Pt here today to have forms filled out for disability. The forms require a Psychiatric evaluation and patietn has not been seen by a Psychiatrist.     Review of Systems     Objective:   Physical Exam        Assessment & Plan:  We will refer him to Psychiatry at his request.

## 2013-02-20 MED ORDER — MELOXICAM 15 MG PO TABS: 15.0000 mg | ORAL_TABLET | Freq: Every day | ORAL | Status: DC

## 2013-02-20 MED ORDER — LEVOTHYROXINE SODIUM 50 MCG PO TABS: 50.0000 ug | ORAL_TABLET | Freq: Every day | ORAL | Status: DC

## 2013-02-20 NOTE — Telephone Encounter (Signed)
Refilled mobic 15mg  # 30 with refill and Levothroid 50mg    # 30 other request have refills

## 2013-02-23 ENCOUNTER — Telehealth: Payer: Self-pay | Admitting: Internal Medicine

## 2013-02-23 DIAGNOSIS — M549 Dorsalgia, unspecified: Secondary | ICD-10-CM

## 2013-02-23 MED ORDER — OXYCODONE-ACETAMINOPHEN 5-325 MG PO TABS: 1.0000 | ORAL_TABLET | ORAL | Status: DC | PRN

## 2013-02-24 NOTE — Telephone Encounter (Signed)
Percocet 5/325 refilled on 02/10/12 # 90 this was a 15 day supply due and ready for pick -up Percocet 5/325 refilled  # 90 this was a 15 day supply due again  03/11/13

## 2013-02-26 ENCOUNTER — Telehealth: Payer: Self-pay | Admitting: Internal Medicine

## 2013-02-26 NOTE — Telephone Encounter (Signed)
Spoke with pt concerned why he was unable to receive Percocet # 120 as previously prescribe. Reviewing his records he has been receiving 1 month intervals. He just filled a prescription for # 90 will re-valuate and next month at f/u appt refilled at monthly intervals.

## 2013-03-09 ENCOUNTER — Ambulatory Visit (HOSPITAL_COMMUNITY): Payer: 59 | Admitting: Psychiatry

## 2013-03-10 ENCOUNTER — Ambulatory Visit (INDEPENDENT_AMBULATORY_CARE_PROVIDER_SITE_OTHER): Payer: 59 | Admitting: Psychiatry

## 2013-03-10 ENCOUNTER — Encounter (HOSPITAL_COMMUNITY): Payer: Self-pay | Admitting: Psychiatry

## 2013-03-10 VITALS — BP 118/72 | HR 60 | Ht 66.0 in | Wt 230.2 lb

## 2013-03-10 DIAGNOSIS — F3289 Other specified depressive episodes: Secondary | ICD-10-CM

## 2013-03-10 DIAGNOSIS — F329 Major depressive disorder, single episode, unspecified: Secondary | ICD-10-CM

## 2013-03-10 DIAGNOSIS — F0630 Mood disorder due to known physiological condition, unspecified: Secondary | ICD-10-CM

## 2013-03-10 MED ORDER — DULOXETINE HCL 30 MG PO CPEP: 30.0000 mg | ORAL_CAPSULE | Freq: Every day | ORAL | Status: DC

## 2013-03-10 NOTE — Progress Notes (Addendum)
Patient ID: Nicholas Mcintosh, male   DOB: [birth year], 47 y.o.   MRN: [id number]  [facility] Psychiatric Assessment Note  Stacie Knutzen [id number] 47 y.o.  03/10/2013 9:57 AM  Chief Complaint:  Establish Care, Sad and depressed  History of Present Illness:  Patient is a 47 year old African American married unemployed male who is referred from his primary care physician.  Patient endorses that he has been depressed and sad in recent years.  Patient told his biggest concern is that he is unable to perform sexual acts and he has no intimacy. 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  He does not remember the details very well but endorsed because of heavy lifting he may have injured his scrotum. Patient told that he has discussed this issue in detail with his previous primary care physician Dr. August Saucer.  Patient told some time he feels very depressed sad and frustrated.  He admitted irritability and anger.  He also endorsed lack of sleep and racing thoughts.  He denies any suicidal thoughts or homicidal thoughts but admitted getting frustrated and irritable easily.  Patient told he cannot satisfy his wife and feet sometimes very hopeless and helpless.  He also endorsed poor concentration, decreased energy, racing thoughts and excessive anxiety symptoms.  He denies any panic attack or any mania but endorsed social isolation and anhedonia.  He is taking amitriptyline 25 mg which is prescribed by Dr. August Saucer and increased upto 75 mg but cause side effects. Patient has never seen a psychiatrist before.  He has never seen any therapist before.  His primary care physician recommended to get psychiatric help.  He denies any nightmares, flashback, obsessive-compulsive thinking or any aggression or violence.  Suicidal Ideation: No Plan Formed: No Patient has means to carry out plan: No  Homicidal Ideation: No Plan Formed: No Patient has means to carry out  plan: No  Review of Systems: Psychiatric: Agitation: Yes Hallucination: No Depressed Mood: Yes Insomnia: Yes Hypersomnia: No Altered Concentration: No Feels Worthless: Yes Grandiose Ideas: No Belief In Special Powers: No New/Increased Substance Abuse: No Compulsions: No  Neurologic: Headache: Yes Seizure: No Paresthesias: No  Medical History;  Patient has history of scrotal injury, benign prostrate hypercalcemia, hypertension, asthma, chronic back pain, chronic fatigue and hypothyroidism.  He is seeing Dr. Ashley Royalty.  Patient has history of back surgery, head trauma and concussion but do not remember the details very well.  He is taking multiple pain medication from pain management.  Psychosocial History: Patient was born and raised in [place].  He's been married for 18 years.  He has 2 children.  He lives with his wife and 2 kids.  Patient reported supportive wife.  His wife work as Charity fundraiser in [employer].   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  He was not involved in any combat missions.  Alcohol and substance use history. Patient denies any history of alcohol or any illegal substances.  History of abuse. Patient denies any history of physical sexual verbal or emotional abuse.  Family history Patient denies family history of psychiatric illness.  Outpatient Encounter Prescriptions as of 03/10/2013  Medication Sig Dispense Refill  . amitriptyline (ELAVIL) 25 MG tablet Take 25 mg by mouth at bedtime.      Marland Kitchen amLODipine-valsartan (EXFORGE) 5-160 MG per tablet Take 1 tablet by mouth daily.  30 tablet  2  . fluoxymesterone (ANDROXY) 10 MG tablet Take 1  tablet (10 mg total) by mouth daily.  30 tablet  1  . gabapentin (NEURONTIN) 400 MG capsule Take 1 capsule (400 mg total) by mouth 3 (three) times daily.  90 capsule  3  . levothyroxine (SYNTHROID, LEVOTHROID) 50 MCG tablet Take 1 tablet (50 mcg total) by mouth daily.  30 tablet  1  .  meloxicam (MOBIC) 15 MG tablet Take 1 tablet (15 mg total) by mouth daily.  30 tablet  1  . methylTESTOSTERone (ANDROID) 10 MG capsule Take 10 mg by mouth daily.      Marland Kitchen oxyCODONE-acetaminophen (PERCOCET/ROXICET) 5-325 MG per tablet Take 1 tablet by mouth every 4 (four) hours as needed.  30 tablet  0  . sildenafil (VIAGRA) 100 MG tablet Take 100 mg by mouth daily as needed for erectile dysfunction.      . DULoxetine (CYMBALTA) 30 MG capsule Take 1 capsule (30 mg total) by mouth daily.  30 capsule  0  . [DISCONTINUED] albuterol (PROVENTIL HFA;VENTOLIN HFA) 108 (90 BASE) MCG/ACT inhaler Inhale 1-2 puffs into the lungs every 6 (six) hours as needed for wheezing.  1 Inhaler  0  . [DISCONTINUED] azithromycin (ZITHROMAX Z-PAK) 250 MG tablet 500 mg po x 1 day, then 250 mg po daily x 4 days  6 each  0  . [DISCONTINUED] doxazosin (CARDURA) 4 MG tablet Take 4 mg by mouth at bedtime.      . [DISCONTINUED] fexofenadine-pseudoephedrine (ALLEGRA-D) 60-120 MG per tablet Take 1 tablet by mouth every 12 (twelve) hours.  30 tablet  0  . [DISCONTINUED] fluticasone (FLOVENT HFA) 44 MCG/ACT inhaler Inhale 1 puff into the lungs 2 (two) times daily.  1 Inhaler  12  . [DISCONTINUED] oxyCODONE-acetaminophen (ROXICET) 5-325 MG per tablet Take 1 tablet by mouth every 4 (four) hours as needed for pain.  90 tablet  0   No facility-administered encounter medications on file as of 03/10/2013.    No results found for this or any previous visit (from the past 72 hour(s)).  Past Psychiatric History/Hospitalization(s) Patient denies any history of suicidal attempt or inpatient psychiatric treatment.  He was never seen any psychiatrist or any counselor.  He endorsed history of depression since 1988 after he has scrotal injury.  Patient denies any history of mania, psychosis, hallucinations, paranoia or aggressive behavior.  He admitted frustration and anger problem however he was never involved in any criminal activity.  Anxiety:  Yes Bipolar Disorder: No Depression: Yes Mania: No Psychosis: No Schizophrenia: No Personality Disorder: No Hospitalization for psychiatric illness: No History of Electroconvulsive Shock Therapy: No Prior Suicide Attempts: No  Physical Exam: Constitutional:  BP 118/72  Pulse 60  Ht 5\' 6"  (1.676 m)  Wt 230 lb 3.2 oz (104.418 kg)  BMI 37.17 kg/m2  Musculoskeletal: Strength & Muscle Tone: within normal limits Gait & Station: normal Patient leans: N/A  Mental Status Examination;  Patient is well built, well dressed and well groomed and who appears to be in his stated age.  He is anxious and maintained fair eye contact.  His speech is slow but coherent.  He described his mood as anxious and at times he was tearful when he was talking about his scrotal injury.  His affect is constricted.  He denies any auditory or visual hallucination.  He denies any active or passive suicidal thoughts or homicidal thoughts.  His attention and concentration is fair.  He has some difficulty remembering details from the past but overall he was relevant in conversation.  His psychomotor  activity is normal.  There were no delusions, paranoia or obsession present at this time.  There were no tremors or shakes present at this time.  His fund of knowledge is average.  He is alert and oriented x3.  His insight judgment and impulse control is okay.   Medical Decision Making (Choose Three): Review of Psycho-Social Stressors (1), Review or order clinical lab tests (1), Decision to obtain old records (1), Established Problem, Worsening (2), New Problem, with no additional work-up planned (3), Review of Medication Regimen & Side Effects (2) and Review of New Medication or Change in Dosage (2)  Assessment: Axis I: Depressive disorder NOS, mood disorder due to general medical condition  Axis II: Deferred  Axis III: see medical history  Axis IV: Moderate   Plan:  I reviewed his symptoms, his current medication,  psychosocial history and stressors.  At this time patient does not feeling better with amitriptyline 25 mg.  In the past he had tried up to 75 mg but developed side effects including dry mouth and cannot tolerate.  He is willing to try a new medication.  We will try Cymbalta 30 mg daily.  Patient also has chronic back pain and he is taking multiple pain medication.  I explained that Cymbalta may help some of his chronic back pain along with helping anxiety and depression.  I recommended to stop amitriptyline since patient is not feeling any improvement at 25 mg amitriptyline.  We will get collateral information from his primary care physician including a recent blood results.  I also talked to him about counseling and therapy for his social and coping skills.  Patient agree with the plan.  We will scheduled appointment with Boneta Lucks for counseling.  I discussed the risk and benefits of Cymbalta and other medication.  I recommend to call us back if he has any questions or concerns.  I will see him again in 3 weeks.Time spent 55 minutes.  More than 50% of the time spent in psychoeducation, counseling and coordination of care.  Discuss safety plan that anytime having active suicidal thoughts or homicidal thoughts then patient need to call 911 or go to the local emergency room.   Demontre Padin T., MD 03/10/2013

## 2013-03-11 ENCOUNTER — Other Ambulatory Visit: Payer: Self-pay | Admitting: Internal Medicine

## 2013-03-11 ENCOUNTER — Telehealth: Payer: Self-pay | Admitting: Internal Medicine

## 2013-03-11 DIAGNOSIS — M549 Dorsalgia, unspecified: Secondary | ICD-10-CM

## 2013-03-11 MED ORDER — OXYCODONE-ACETAMINOPHEN 5-325 MG PO TABS: 1.0000 | ORAL_TABLET | ORAL | Status: DC | PRN

## 2013-03-11 NOTE — Progress Notes (Signed)
Prescription for 15 day supply (#90) dispensed for Oxycodone/Acetaminophen 5/325 mg. Last prescription 02/26/2013. No change is dose or frequency.

## 2013-03-13 DIAGNOSIS — M62838 Other muscle spasm: Secondary | ICD-10-CM

## 2013-03-13 MED ORDER — SOMA 350 MG PO TABS: 350.0000 mg | ORAL_TABLET | Freq: Every day | ORAL | Status: DC

## 2013-03-13 NOTE — Progress Notes (Signed)
Patient ID: Nicholas Mcintosh, male   DOB: Aug 19, 1965, 47 y.o.   MRN: [id number] Nicholas Mcintosh came to the office today to pick up prescriptions at 4:55 pm. This CM gave him a hard copy of OxyCODONE-acetaminophen(Percocet/Roxicet) 5-325 mg #90. This CM called in a verbal prescription for Soma 350mg  #30 1 tablet QHS to [facility]. Nicholas Mcintosh appeared to be very agitated because he wants the soma prescription written for #60 because he has to pay a $4.00 copay. This CM advised Nicholas Mcintosh this was the verbal order and suggest Nicholas Mcintosh to speak with Dr. Ashley Royalty at next office visit.        This CM received a verbal order Soma 350 mg 1 tablet QHS #30 with no refills from Dr. Marthann Schiller with read back.        Karoline Caldwell, RN, BSN, [place]   [phone]

## 2013-03-17 ENCOUNTER — Telehealth: Payer: Self-pay | Admitting: Internal Medicine

## 2013-03-17 NOTE — Telephone Encounter (Signed)
Pt picked up Soma on Friday and expressed concerns that he had been taking Soma BID while under the care of Dr. August Saucer. A review shows no documentation in the progress notes to support a change in frequency from q HS to BID. However a copy of a prescription and a out-patient Pharmacy records show that he has been receiving Soma at a dose of 350 mg BID PRN. I have instructed patient to take Soma at dose of BID PRN and that his next prescription will be dispensed at 350 mg # 60 pills with refills.

## 2013-03-25 ENCOUNTER — Telehealth: Payer: Self-pay | Admitting: Internal Medicine

## 2013-03-25 DIAGNOSIS — M549 Dorsalgia, unspecified: Secondary | ICD-10-CM

## 2013-03-25 MED ORDER — OXYCODONE-ACETAMINOPHEN 5-325 MG PO TABS: 1.0000 | ORAL_TABLET | ORAL | Status: DC | PRN

## 2013-03-25 NOTE — Telephone Encounter (Signed)
Mr. Apostol called for a refill according to the date written and the date actually pick up and filled there is a 2 day delay therefore his Percocet 5/325 # 90 is a 15 day supply this will be ready for pick up on Friday 03/27/13

## 2013-03-26 ENCOUNTER — Ambulatory Visit (INDEPENDENT_AMBULATORY_CARE_PROVIDER_SITE_OTHER): Payer: 59 | Admitting: Psychiatry

## 2013-03-26 ENCOUNTER — Encounter (HOSPITAL_COMMUNITY): Payer: Self-pay | Admitting: Psychiatry

## 2013-03-26 DIAGNOSIS — F411 Generalized anxiety disorder: Secondary | ICD-10-CM

## 2013-03-26 DIAGNOSIS — F329 Major depressive disorder, single episode, unspecified: Secondary | ICD-10-CM

## 2013-03-26 DIAGNOSIS — N529 Male erectile dysfunction, unspecified: Secondary | ICD-10-CM

## 2013-03-26 NOTE — Progress Notes (Signed)
Patient ID: Nicholas Mcintosh, male   DOB: 10/20/1965, 47 y.o.   MRN: [id number] Presenting Problem Chief Complaint: depression  What are the main stressors in your life right now, how long? Sexual dysfunction, marital distress  Previous mental health services Have you ever been treated for a mental health problem, when, where, by whom? No    Are you currently seeing a therapist or counselor, counselor's name? No   Have you ever had a mental health hospitalization, how many times, length of stay? No    Have you ever had suicidal thoughts or attempted suicide, when, how? No   Risk factors for Suicide Demographic factors:  Male Current mental status: no suicidal ideation reported Loss factors: Decline in physical health Historical factors: none Risk Reduction factors: Living with another person, especially a relative Clinical factors:  depression Cognitive features that contribute to risk: none    SUICIDE RISK:  Minimal: No identifiable suicidal ideation.  Patients presenting with no risk factors but with morbid ruminations; may be classified as minimal risk based on the severity of the depressive symptoms   Social/family history Have you been married, how many times?  Married once 18 years  Do you have children?  30 year old son  Who lives in your current household? wife  Military history: Yes. 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   Religious/spiritual involvement:  What religion/faith base are you? Christian  Family of origin (childhood history)  Where were you born? [place] Where did you grow up[place]  Describe the atmosphere of the household where you grew up: raised by mother and maternal grandmother. Pt. Describes as loving and supportive Do you have siblings, step/half siblings, list names, relation, sex, age? No   Are your parents separated/divorced, when and why? Yes   Are your parents alive? Yes   Social  supports (personal and professional): mother, friends. Pt. Describes emotionally disengaged relationship with wife.  Education How many grades have you completed? high school diploma/GED Did you have any problems in school, what type? No  Medications prescribed for these problems? No   Employment (financial issues) Part-time with [employer]  Legal history none  Trauma/Abuse history: Have you ever been exposed to any form of abuse, what type? No   Have you ever been exposed to something traumatic, describe? No   Substance use none  Mental Status: General Appearance /Behavior:  Casual Eye Contact:  Good Motor Behavior:  Normal Speech:  Normal Level of Consciousness:  Alert Mood:  Depressed Affect:  Blunt Anxiety Level:  minimal Thought Process:  Coherent Thought Content:  WNL Perception:  Normal Judgment:  Good Insight:  Present Cognition: wnl  Diagnosis AXIS I Depressive Disorder NOS  AXIS II No diagnosis  AXIS III Past Medical History  Diagnosis Date  . Asthma   . Hypertension   . Hypothyroidism   . New-onset angina 07/12/2011  . Back pain     Secondary to 2-story fall 2006    AXIS IV other psychosocial or environmental problems  AXIS V 51-60 moderate symptoms   Plan: Pt. To return in two weeks for continued assessment and possible referral to sex and marriage therapist. Pt. 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 Pt. Reports significant sadness, anger, lost of sense of self due to injury. Pt. 's wife married him in 1996 with knowledge  of his injury and impotence, but the issue has become increasingly problematic with anger and tension in the marriage. Pt. Reports that he sleeps approximately 2-3 hours a night and is limited in his ability to engage in physical exercise because of the inflammation and pain in his scrotum.    _________________________________________        Boneta Lucks, Ph.D., LPC, N[facility]

## 2013-03-31 ENCOUNTER — Ambulatory Visit (HOSPITAL_COMMUNITY): Payer: Self-pay | Admitting: Psychiatry

## 2013-04-06 ENCOUNTER — Other Ambulatory Visit: Payer: Self-pay | Admitting: Internal Medicine

## 2013-04-06 NOTE — Telephone Encounter (Signed)
androxy 10 mg po daily refilled escribed to [facility] out pt pharmacy

## 2013-04-07 ENCOUNTER — Telehealth: Payer: Self-pay | Admitting: Internal Medicine

## 2013-04-07 ENCOUNTER — Ambulatory Visit (INDEPENDENT_AMBULATORY_CARE_PROVIDER_SITE_OTHER): Payer: 59 | Admitting: Internal Medicine

## 2013-04-07 ENCOUNTER — Encounter: Payer: Self-pay | Admitting: Internal Medicine

## 2013-04-07 VITALS — BP 130/80 | HR 56 | Temp 98.2°F | Resp 16 | Ht 66.0 in | Wt 230.0 lb

## 2013-04-07 DIAGNOSIS — Z5181 Encounter for therapeutic drug level monitoring: Secondary | ICD-10-CM

## 2013-04-07 DIAGNOSIS — Z79899 Other long term (current) drug therapy: Secondary | ICD-10-CM

## 2013-04-07 DIAGNOSIS — M549 Dorsalgia, unspecified: Secondary | ICD-10-CM

## 2013-04-07 DIAGNOSIS — E349 Endocrine disorder, unspecified: Secondary | ICD-10-CM

## 2013-04-07 DIAGNOSIS — N62 Hypertrophy of breast: Secondary | ICD-10-CM

## 2013-04-07 DIAGNOSIS — E291 Testicular hypofunction: Secondary | ICD-10-CM

## 2013-04-07 MED ORDER — OXYCODONE-ACETAMINOPHEN 5-325 MG PO TABS: 1.0000 | ORAL_TABLET | ORAL | Status: DC | PRN

## 2013-04-07 NOTE — Telephone Encounter (Signed)
Pt has an appt with Dr. Ashley Royalty today requesting Androxy 10 last filled

## 2013-04-07 NOTE — Progress Notes (Signed)
  Subjective:    Patient ID: Nicholas Mcintosh, male    DOB: 13-Sep-1965, 47 y.o.   MRN: [id number]  HPI: Pt here today for follow-up and to discuss low testosterone and continuation of Testosterone replacement. Pt has developed gynecomastia since starting Testosterone which he states creates a negative self image. Additionally, pt has not engaged in intercourse for almost 1 year and states that he has markedly decreased libido. He has had his thyroid evaluated and this was found to be normal. He did have initial low testosterone of 193 when the diagnosis of low testosterone was made. I have discussed with him the Cardiac risk found in men > age 33. Also dicussed th potential risk of liver damage and prostate enlargement.    Review of Systems  Constitutional: Negative.   HENT: Negative.   Eyes: Negative.   Respiratory: Negative.   Cardiovascular: Negative.   Gastrointestinal: Negative.   Endocrine: Negative.   Genitourinary: Negative.        Erectile dysfunction and decreased libido.  Musculoskeletal: Negative for myalgias and arthralgias.  Skin: Negative.   Allergic/Immunologic: Negative.   Neurological: Negative.   Hematological: Negative.   Psychiatric/Behavioral: Positive for sleep disturbance.  All other systems reviewed and are negative.        Objective:   Physical Exam  Constitutional: He is oriented to person, place, and time. He appears well-developed and well-nourished.  HENT:  Head: Atraumatic.  Eyes: Conjunctivae and EOM are normal. Pupils are equal, round, and reactive to light.  Neck: Normal range of motion. Neck supple.  Cardiovascular: Normal rate and regular rhythm.  Exam reveals no gallop and no friction rub.   No murmur heard. Pulmonary/Chest: Effort normal and breath sounds normal. He has no wheezes. He has no rales. He exhibits no tenderness.  Abdominal: Soft. Bowel sounds are normal. He exhibits no mass.  Musculoskeletal: Normal range of motion.   Neurological: He is alert and oriented to person, place, and time.  Skin: Skin is warm and dry.  Psychiatric: He has a normal mood and affect. His behavior is normal. Judgment and thought content normal.          Assessment & Plan:  Low testerone: Discussed side effects with patient and he wishes to stop Testerone. Will consider referring to Endocrinologist. Check liver function and PSA.  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 Pt currently under counseling and has used biagra but has markedly decreased libido which has not been helped by testosterone replacement  Chronic pain: Controlled with Percocet. Will continue percocet. Prescription generated but patient wants to pick it up on Friday.   Depression: Was referred to Psychiatry and Psychotherapy. Pt having ongoing psychotherapy sessions.  Labs: CMET, Lipid and PSA on 04/10/2013.  RTC: 3 month or PRN  Greater than 50% of time spent on counseling and discussion of Low testoterone, Erectile dysfunction and gynecomastia

## 2013-04-08 DIAGNOSIS — N62 Hypertrophy of breast: Secondary | ICD-10-CM | POA: Insufficient documentation

## 2013-04-10 ENCOUNTER — Other Ambulatory Visit: Payer: 59

## 2013-04-10 DIAGNOSIS — Z5181 Encounter for therapeutic drug level monitoring: Secondary | ICD-10-CM

## 2013-04-10 LAB — COMPREHENSIVE METABOLIC PANEL
Alkaline Phosphatase: 46 U/L (ref 39–117)
BUN: 11 mg/dL (ref 6–23)
Glucose, Bld: 100 mg/dL — ABNORMAL HIGH (ref 70–99)
Sodium: 138 mEq/L (ref 135–145)
Total Bilirubin: 0.5 mg/dL (ref 0.3–1.2)

## 2013-04-10 LAB — LIPID PANEL
Cholesterol: 118 mg/dL (ref 0–200)
HDL: 25 mg/dL — ABNORMAL LOW (ref >39)
Total CHOL/HDL Ratio: 4.7 Ratio
Triglycerides: 76 mg/dL (ref <150)
VLDL: 15 mg/dL (ref 0–40)

## 2013-04-13 ENCOUNTER — Ambulatory Visit (INDEPENDENT_AMBULATORY_CARE_PROVIDER_SITE_OTHER): Payer: 59 | Admitting: Psychiatry

## 2013-04-13 ENCOUNTER — Encounter (HOSPITAL_COMMUNITY): Payer: Self-pay | Admitting: Psychiatry

## 2013-04-13 DIAGNOSIS — F411 Generalized anxiety disorder: Secondary | ICD-10-CM

## 2013-04-13 NOTE — Progress Notes (Signed)
   THERAPIST PROGRESS NOTE  Session Time: 11:00-11:50  Participation Level: Active  Behavioral Response: CasualAlertDysphoric  Type of Therapy: Individual Therapy  Treatment Goals addressed: emotion regulation  Interventions: Strength-based and Supportive  Summary: Nicholas Mcintosh is a 47 y.o. male who presents with depression.   Suicidal/Homicidal: Nowithout intent/plan  Therapist Observations/Response: Recommended sex therapist and couple's counseling. Pt. Reported concerned about his wife's attitude toward sex therapy. Explored Pt.'s resistance to sex therapy and relationship counseling. Explained to Pt. That I could assist with exploring environmental stressors and exploring possible changes that can be made to the environment (i.e., sleep routine, exercise, nutrition, career) and stress management strategies as well as insight therapy, but that a sex therapist could offer strategies to assist him and his wife with their sexual relationship. Explained to Pt. That I would speak to Dr. Lolly Mustache about the disability benefits questionnaire.  Plan: Return again in 2 weeks.  Diagnosis: Axis I: Depressive Disorder NOS    Axis II: No diagnosis    Wynonia Musty 04/13/2013

## 2013-04-14 ENCOUNTER — Telehealth: Payer: Self-pay | Admitting: *Deleted

## 2013-04-14 NOTE — Telephone Encounter (Signed)
Informed patient that labs normal. Will check testerone levels before next appointment.

## 2013-04-15 ENCOUNTER — Other Ambulatory Visit: Payer: Self-pay | Admitting: Hematology

## 2013-04-15 DIAGNOSIS — E349 Endocrine disorder, unspecified: Secondary | ICD-10-CM

## 2013-04-21 ENCOUNTER — Telehealth: Payer: Self-pay | Admitting: Internal Medicine

## 2013-04-21 DIAGNOSIS — M549 Dorsalgia, unspecified: Secondary | ICD-10-CM

## 2013-04-22 MED ORDER — OXYCODONE-ACETAMINOPHEN 5-325 MG PO TABS: 1.0000 | ORAL_TABLET | ORAL | Status: DC | PRN

## 2013-04-22 NOTE — Telephone Encounter (Signed)
Refilled for Percocet 5/325mg  (1) Q 4hrs prn # 90 received 03/07/13 a 15 day supply ready for pick up

## 2013-04-27 ENCOUNTER — Ambulatory Visit (HOSPITAL_COMMUNITY): Payer: Self-pay | Admitting: Psychiatry

## 2013-05-04 ENCOUNTER — Encounter (HOSPITAL_COMMUNITY): Payer: Self-pay | Admitting: Psychiatry

## 2013-05-04 ENCOUNTER — Telehealth: Payer: Self-pay | Admitting: Internal Medicine

## 2013-05-04 ENCOUNTER — Ambulatory Visit (INDEPENDENT_AMBULATORY_CARE_PROVIDER_SITE_OTHER): Payer: 59 | Admitting: Psychiatry

## 2013-05-04 VITALS — BP 156/82 | HR 68 | Ht 66.0 in | Wt 231.8 lb

## 2013-05-04 DIAGNOSIS — F0630 Mood disorder due to known physiological condition, unspecified: Secondary | ICD-10-CM

## 2013-05-04 DIAGNOSIS — F329 Major depressive disorder, single episode, unspecified: Secondary | ICD-10-CM

## 2013-05-04 DIAGNOSIS — M549 Dorsalgia, unspecified: Secondary | ICD-10-CM

## 2013-05-04 MED ORDER — OXYCODONE-ACETAMINOPHEN 5-325 MG PO TABS: 1.0000 | ORAL_TABLET | ORAL | Status: DC | PRN

## 2013-05-04 MED ORDER — DULOXETINE HCL 60 MG PO CPEP: 60.0000 mg | ORAL_CAPSULE | Freq: Every day | ORAL | Status: DC

## 2013-05-04 NOTE — Telephone Encounter (Signed)
Nicholas Mcintosh called for a refill on his Percocet 5/325 last filled on 04/22/13 # 90 this is a 15 day supply ready for pick up on or after 05/07/13

## 2013-05-04 NOTE — Progress Notes (Signed)
[facility] [id number] Progress Note  Nicholas Mcintosh [id number] 47 y.o.  05/04/2013 11:38 AM  Chief Complaint:  I still feel the same.  I still feel depressed and sad.    History of Present Illness:  Patient is a 47 year old African American married unemployed male who came for his followup appointment.  We started him on Cymbalta 30 mg.  He is not taking amitriptyline.  He showed some improvement in his sleep however he continued to endorse irritability anger and depressive thoughts.  He is seeing [name] in this office who recommended to see a sex therapist .  Patient has not seen yet but hoping to get appointment soon.  Patient denies any side effects of Cymbalta.  He continues to take multiple pain medication .  He continues to have back pain.  He endorsed crying spells and irritability however he denies any active or passive suicidal thoughts or homicidal thoughts.  He endorsed decreased energy and sometimes feeling hopeless and helpless.  He endorse that his biggest stressor is unable to perform sex because of injury to scrotum .  He is open to try a higher dose of Cymbalta to help his irritability anger and sleep.  He wanted his disability paper and FMLA paper to be completed so he can continue to come for his appointment and see therapist for his treatment.  Suicidal Ideation: No Plan Formed: No Patient has means to carry out plan: No  Homicidal Ideation: No Plan Formed: No Patient has means to carry out plan: No  Review of Systems: Psychiatric: Agitation: Yes Hallucination: No Depressed Mood: Yes Insomnia: Yes Hypersomnia: No Altered Concentration: No Feels Worthless: Yes Grandiose Ideas: No Belief In Special Powers: No New/Increased Substance Abuse: No Compulsions: No  Neurologic: Headache: Yes Seizure: No Paresthesias: No  Medical History;  Patient has history of scrotal injury, benign prostrate hypercalcemia, hypertension, asthma, chronic back pain,  chronic fatigue, hypothyroidism and dysfunction.  He is seeing Dr. Ashley Royalty.  Patient has history of back surgery, head trauma and concussion but do not remember the details very well.  He is taking multiple pain medication from pain management.  Psychosocial History: Patient was born and raised in [place].  He's been married for 18 years.  He has 2 children.  He lives with his wife and 2 kids.  Patient reported supportive wife.  His wife work as Rn in [employer].   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  He was not involved in any combat missions.  Alcohol and substance use history. Patient denies any history of alcohol or any illegal substances.  History of abuse. Patient denies any history of physical sexual verbal or emotional abuse.  Family history Patient denies family history of psychiatric illness.  Outpatient Encounter Prescriptions as of 05/04/2013  Medication Sig Dispense Refill  . amLODipine-valsartan (EXFORGE) 5-160 MG per tablet Take 1 tablet by mouth daily.  30 tablet  2  . DULoxetine (CYMBALTA) 60 MG capsule Take 1 capsule (60 mg total) by mouth daily.  30 capsule  0  . gabapentin (NEURONTIN) 400 MG capsule Take 1 capsule (400 mg total) by mouth 3 (three) times daily.  90 capsule  3  . levothyroxine (SYNTHROID, LEVOTHROID) 50 MCG tablet Take 1 tablet (50 mcg total) by mouth daily.  30 tablet  1  . meloxicam (MOBIC) 15 MG tablet Take 1 tablet (15 mg total) by mouth daily.  30 tablet  1  . methylTESTOSTERone (ANDROID) 10 MG  capsule Take 10 mg by mouth daily.      Marland Kitchen oxyCODONE-acetaminophen (PERCOCET/ROXICET) 5-325 MG per tablet Take 1 tablet by mouth every 4 (four) hours as needed.  90 tablet  0  . sildenafil (VIAGRA) 100 MG tablet Take 100 mg by mouth daily as needed for erectile dysfunction.      Marland Kitchen SOMA 350 MG tablet Take 1 tablet (350 mg total) by mouth at bedtime.  30 tablet  0  . [DISCONTINUED] DULoxetine (CYMBALTA) 30 MG  capsule Take 1 capsule (30 mg total) by mouth daily.  30 capsule  0  . [DISCONTINUED] amitriptyline (ELAVIL) 25 MG tablet Take 25 mg by mouth at bedtime.      . [DISCONTINUED] ANDROXY 10 MG tablet TAKE 1 TABLET BY MOUTH ONCE DAILY  30 tablet  1   No facility-administered encounter medications on file as of 05/04/2013.    No results found for this or any previous visit (from the past 72 hour(s)).  Past Psychiatric History/Hospitalization(s) Patient denies any history of suicidal attempt or inpatient psychiatric treatment.  He was never seen any psychiatrist or any counselor.  He endorsed history of depression since 1988 after he has scrotal injury.  Patient denies any history of mania and psychosis hallucinations and paranoia aggressive behavior.  He admitted frustration and anger problem however he was never involved in any criminal activity.  Anxiety: Yes Bipolar Disorder: No Depression: Yes Mania: No Psychosis: No Schizophrenia: No Personality Disorder: No Hospitalization for psychiatric illness: No History of Electroconvulsive Shock Therapy: No Prior Suicide Attempts: No  Physical Exam: Constitutional:  BP 156/82  Pulse 68  Ht 5\' 6"  (1.676 m)  Wt 231 lb 12.8 oz (105.144 kg)  BMI 37.43 kg/m2  Musculoskeletal: Strength & Muscle Tone: within normal limits Gait & Station: normal Patient leans: N/A  Mental Status Examination;  Patient is well built, well dressed and well groomed and who appears to be in his stated age.  He is anxious and maintained fair eye contact.  His speech is slow but coherent.  He described his mood depressed and his affect is constricted.  He denies any auditory or visual hallucination.  He denies any active or passive suicidal thoughts or homicidal thoughts.  His attention and concentration is fair.  His psychomotor activity is normal.  There were no delusions, paranoia or obsession present at this time.  There were no tremors or shakes present at this time.   His fund of knowledge is average.  He is alert and oriented x3.  His insight judgment and impulse control is okay.   Medical Decision Making (Choose Three): Established Problem, Stable/Improving (1), Review of Psycho-Social Stressors (1), Review or order clinical lab tests (1), Review of Medication Regimen & Side Effects (2) and Review of New Medication or Change in Dosage (2)  Assessment: Axis I: Depressive disorder NOS, mood disorder due to general medical condition  Axis II: Deferred  Axis III: see medical history  Axis IV: Moderate   Plan:  I recommended try Cymbalta 60 mg.  So far patient does not have any side effects with Cymbalta.  Recommend to see [name] for counseling.  The patient is in the process of getting appointment with sex therapist .  We will complete his disability form so she can continue his treatment and appointment.  Recommend to call us back if he has any question or any concern.  Followup in 6 weeks. Time spent 25 minutes.  More than 50% of the time spent in  psychoeducation, counseling and coordination of care.  Discuss safety plan that anytime having active suicidal thoughts or homicidal thoughts then patient need to call 911 or go to the local emergency room.   Maurianna Benard T., MD 05/04/2013

## 2013-05-19 ENCOUNTER — Other Ambulatory Visit: Payer: Self-pay | Admitting: Internal Medicine

## 2013-05-19 DIAGNOSIS — M549 Dorsalgia, unspecified: Secondary | ICD-10-CM

## 2013-05-21 MED ORDER — OXYCODONE-ACETAMINOPHEN 5-325 MG PO TABS: 1.0000 | ORAL_TABLET | ORAL | Status: DC | PRN

## 2013-05-22 ENCOUNTER — Ambulatory Visit (INDEPENDENT_AMBULATORY_CARE_PROVIDER_SITE_OTHER): Payer: 59 | Admitting: *Deleted

## 2013-05-22 ENCOUNTER — Telehealth: Payer: Self-pay | Admitting: *Deleted

## 2013-05-22 VITALS — BP 136/86 | HR 90 | Temp 98.2°F | Resp 16

## 2013-05-22 DIAGNOSIS — Z23 Encounter for immunization: Secondary | ICD-10-CM

## 2013-05-22 NOTE — Telephone Encounter (Signed)
Pt feels Oxycodone 5-325 mg does not be controll his back pain. Would like to try 10 mg Oxycodone. He received Rx for 90 tablets of Oxycodone 05/22/13. Next follow-up appointment is Jan. 2015

## 2013-05-25 ENCOUNTER — Telehealth: Payer: Self-pay | Admitting: *Deleted

## 2013-05-25 ENCOUNTER — Other Ambulatory Visit: Payer: Self-pay | Admitting: Internal Medicine

## 2013-05-25 ENCOUNTER — Ambulatory Visit (HOSPITAL_COMMUNITY): Payer: Self-pay | Admitting: Psychiatry

## 2013-05-25 NOTE — Telephone Encounter (Signed)
Please call patient and schedule a 15 minute appointment to address the change in medication dose. Reason for visit is back pain.

## 2013-05-25 NOTE — Telephone Encounter (Signed)
Spoke with pt about appointment for medication change he decided to continue with current dosage of Oxycodone 5-325 for another week. He will keep the Jan. 2015 appointment. Will not make another appointment at this time.

## 2013-05-25 NOTE — Telephone Encounter (Signed)
Refill Exforge 5-160 mg

## 2013-06-01 ENCOUNTER — Telehealth: Payer: Self-pay | Admitting: Internal Medicine

## 2013-06-01 DIAGNOSIS — M549 Dorsalgia, unspecified: Secondary | ICD-10-CM

## 2013-06-01 MED ORDER — OXYCODONE-ACETAMINOPHEN 5-325 MG PO TABS: 1.0000 | ORAL_TABLET | ORAL | Status: DC | PRN

## 2013-06-01 NOTE — Telephone Encounter (Signed)
PT had called earlier in the month requesting an increase in the dose of his medication. He was offered an appointment to evaluate the need for increase in dose and decided to wait until his January appointment to discuss a change in dose. His prescription is being refilled for Oxycodone/acetaminophen 5/325 mg q 4 hours PRN. # 90 prescribed.

## 2013-06-08 ENCOUNTER — Encounter (HOSPITAL_COMMUNITY): Payer: Self-pay | Admitting: Psychiatry

## 2013-06-08 ENCOUNTER — Ambulatory Visit (INDEPENDENT_AMBULATORY_CARE_PROVIDER_SITE_OTHER): Payer: 59 | Admitting: Psychiatry

## 2013-06-08 DIAGNOSIS — F411 Generalized anxiety disorder: Secondary | ICD-10-CM

## 2013-06-08 DIAGNOSIS — F329 Major depressive disorder, single episode, unspecified: Secondary | ICD-10-CM

## 2013-06-08 NOTE — Progress Notes (Signed)
Patient ID: Nicholas Mcintosh, male   DOB: Dec 24, 1965, 47 y.o.   MRN: [id number]   Session Time: 11:00-11:50   Participation Level: Active   Behavioral Response: CasualAlertEuthymic  Type of Therapy: Individual Therapy   Treatment Goals addressed: emotion regulation   Interventions: Strength-based and Supportive   Summary: Nicholas Mcintosh is a 47 y.o. male who presents with depression.   Suicidal/Homicidal: Nowithout intent/plan   Therapist Observations/Response: Pt. Presents as relaxed, good mood. Pt. Received referral for sex therapy. Session focused on grief counseling/meaning aunt who died recently, caregiving for wife.   Plan: Return again in 2 weeks.   Diagnosis: Axis I: Depressive Disorder NOS   Axis II: No diagnosis  Wynonia Musty  06/08/2013

## 2013-06-15 ENCOUNTER — Ambulatory Visit (HOSPITAL_COMMUNITY): Payer: Self-pay | Admitting: Psychiatry

## 2013-06-16 ENCOUNTER — Telehealth: Payer: Self-pay | Admitting: *Deleted

## 2013-06-16 NOTE — Telephone Encounter (Signed)
Rx refill for Percocet 5-325 mg 1 tab every 4 hours as needed # 90

## 2013-06-17 ENCOUNTER — Other Ambulatory Visit: Payer: Self-pay | Admitting: Internal Medicine

## 2013-06-17 DIAGNOSIS — M549 Dorsalgia, unspecified: Secondary | ICD-10-CM

## 2013-06-17 DIAGNOSIS — F1120 Opioid dependence, uncomplicated: Secondary | ICD-10-CM

## 2013-06-17 MED ORDER — OXYCODONE-ACETAMINOPHEN 5-325 MG PO TABS: 1.0000 | ORAL_TABLET | ORAL | Status: DC | PRN

## 2013-06-17 NOTE — Progress Notes (Signed)
Prescription is being refilled for Oxycodone/acetaminophen 5/325 mg q 4 hours PRN. # 90 prescribed. He needs Pro[employer] drug monitoring profile.

## 2013-06-19 ENCOUNTER — Telehealth: Payer: Self-pay | Admitting: *Deleted

## 2013-06-19 ENCOUNTER — Telehealth: Payer: Self-pay | Admitting: Internal Medicine

## 2013-06-19 NOTE — Telephone Encounter (Signed)
Pt comes in to pick up Rx and was asked for a urine sample per pt states he is unable to void at this time and is a hurry as well. He will come back at 12:00 today to give a urine.

## 2013-06-22 ENCOUNTER — Other Ambulatory Visit: Payer: Self-pay | Admitting: *Deleted

## 2013-06-22 DIAGNOSIS — F1120 Opioid dependence, uncomplicated: Secondary | ICD-10-CM

## 2013-06-24 LAB — PRESCRIPTION MONITORING PROFILE (SOLSTAS)
Amphetamine/Meth: NEGATIVE ng/mL
Barbiturate Screen, Urine: NEGATIVE ng/mL
Benzodiazepine Screen, Urine: NEGATIVE ng/mL
Carisoprodol, Urine: NEGATIVE ng/mL
Cocaine Metabolites: NEGATIVE ng/mL
Fentanyl, Ur: NEGATIVE ng/mL
MDMA URINE: NEGATIVE ng/mL
Nitrites, Initial: NEGATIVE ug/mL
Propoxyphene: NEGATIVE ng/mL
Tapentadol, urine: NEGATIVE ng/mL
Tramadol Scrn, Ur: NEGATIVE ng/mL
Zolpidem, Urine: NEGATIVE ng/mL
pH, Initial: 6.7 pH (ref 4.5–8.9)

## 2013-06-24 LAB — OPIATES/OPIOIDS (LC/MS-MS)
Hydrocodone: NEGATIVE ng/mL
Hydromorphone: NEGATIVE ng/mL
Norhydrocodone, Ur: NEGATIVE ng/mL
Noroxycodone, Ur: 1184 ng/mL — AB
Oxycodone, ur: 5079 ng/mL — AB
Oxymorphone: 426 ng/mL — AB

## 2013-07-01 ENCOUNTER — Telehealth (HOSPITAL_COMMUNITY): Payer: Self-pay

## 2013-07-01 ENCOUNTER — Telehealth: Payer: Self-pay | Admitting: *Deleted

## 2013-07-01 NOTE — Telephone Encounter (Signed)
Faxed Medical records to [facility] for 02/24/13 to present

## 2013-07-01 NOTE — Telephone Encounter (Signed)
9:27AM 07/01/13 Patient came to pick-up his medical records and then requested that medical records be mailed to [facility] [employer] [facility] [employer] [address] - [place] [address].Marland KitchenMarguerite Olea

## 2013-07-03 ENCOUNTER — Other Ambulatory Visit: Payer: Self-pay | Admitting: Internal Medicine

## 2013-07-03 ENCOUNTER — Telehealth: Payer: Self-pay | Admitting: Internal Medicine

## 2013-07-03 DIAGNOSIS — M549 Dorsalgia, unspecified: Secondary | ICD-10-CM

## 2013-07-03 MED ORDER — OXYCODONE-ACETAMINOPHEN 5-325 MG PO TABS: 1.0000 | ORAL_TABLET | ORAL | Status: DC | PRN

## 2013-07-03 NOTE — Progress Notes (Signed)
Prescription issued for Percocet 5/325 mg #90 taken q 4 hours as needed ( 15 days)

## 2013-07-08 ENCOUNTER — Telehealth: Payer: Self-pay | Admitting: Internal Medicine

## 2013-07-09 ENCOUNTER — Other Ambulatory Visit (HOSPITAL_COMMUNITY): Payer: Self-pay | Admitting: *Deleted

## 2013-07-09 MED ORDER — LEVOTHYROXINE SODIUM 50 MCG PO TABS: 50.0000 ug | ORAL_TABLET | Freq: Every day | ORAL | Status: DC

## 2013-07-10 ENCOUNTER — Other Ambulatory Visit: Payer: Self-pay | Admitting: Hematology

## 2013-07-10 ENCOUNTER — Telehealth: Payer: Self-pay | Admitting: *Deleted

## 2013-07-10 MED ORDER — MELOXICAM 15 MG PO TABS: 15.0000 mg | ORAL_TABLET | Freq: Every day | ORAL | Status: DC

## 2013-07-10 NOTE — Telephone Encounter (Signed)
Refill sent electronically to pharmacy. 

## 2013-07-10 NOTE — Telephone Encounter (Signed)
Rx refill received From [facility] for Meloxicam 15 mg # 30

## 2013-07-10 NOTE — Telephone Encounter (Signed)
error 

## 2013-07-13 ENCOUNTER — Telehealth: Payer: Self-pay | Admitting: Internal Medicine

## 2013-07-13 ENCOUNTER — Other Ambulatory Visit: Payer: Self-pay | Admitting: Hematology

## 2013-07-13 MED ORDER — EXFORGE 5-160 MG PO TABS: ORAL_TABLET | ORAL | Status: DC

## 2013-07-13 NOTE — Telephone Encounter (Signed)
Rx refilled electronically °

## 2013-07-15 ENCOUNTER — Other Ambulatory Visit: Payer: Self-pay | Admitting: Internal Medicine

## 2013-07-15 ENCOUNTER — Telehealth: Payer: Self-pay | Admitting: Internal Medicine

## 2013-07-15 ENCOUNTER — Ambulatory Visit (INDEPENDENT_AMBULATORY_CARE_PROVIDER_SITE_OTHER): Payer: 59 | Admitting: Psychiatry

## 2013-07-15 VITALS — BP 149/88 | HR 56 | Ht 66.0 in | Wt 232.0 lb

## 2013-07-15 DIAGNOSIS — F329 Major depressive disorder, single episode, unspecified: Secondary | ICD-10-CM

## 2013-07-15 DIAGNOSIS — F0630 Mood disorder due to known physiological condition, unspecified: Secondary | ICD-10-CM

## 2013-07-15 DIAGNOSIS — M549 Dorsalgia, unspecified: Secondary | ICD-10-CM

## 2013-07-15 MED ORDER — OXYCODONE-ACETAMINOPHEN 5-325 MG PO TABS: 1.0000 | ORAL_TABLET | ORAL | Status: DC | PRN

## 2013-07-15 MED ORDER — DULOXETINE HCL 60 MG PO CPEP: 60.0000 mg | ORAL_CAPSULE | Freq: Every day | ORAL | Status: DC

## 2013-07-15 NOTE — Progress Notes (Signed)
[facility] [address] Progress Note  Nicholas Mcintosh [id number] 47 y.o.  07/15/2013 8:56 AM  Chief Complaint:  Medication management and followup.      History of Present Illness:  Nicholas Mcintosh came for his followup appointment.  On his last visit we increased Cymbalta 60 mg.  He's tolerating medication without any side effects.  He is able to sleep 4-5 hours.  He is less depressed and less anxious.  He continues to struggle with this pain and gets frustrated when he cannot perform sex.  However he is feeling more calm and less irritable.  He denies any crying spells.  He was very sad when 2 months ago his aunt died due to sarcoidosis.  He was very close to his aunt.  Patient denies any tremors or any shakes.  He is seeing Nicholas Mcintosh for individual counseling however he is recommended to see sex therapist.  Patient has not able to schedule appointment due to economic stress.  However he had decided to call to set up an appointment with sex therapist after the Christmas.  He is no longer taking testosterone because it is causing breast enlargement and tenderness.  It was discontinued by her primary care physician Dr. Ashley Royalty .  He is waiting from [place] about his disability.  Patient overall less irritable and less angry.  He is more social and active.  He is not drinking or using any illegal substances.  Suicidal Ideation: No Plan Formed: No Patient has means to carry out plan: No  Homicidal Ideation: No Plan Formed: No Patient has means to carry out plan: No  Review of Systems: Psychiatric: Agitation: Yes Hallucination: No Depressed Mood: Yes Insomnia: Yes Hypersomnia: No Altered Concentration: No Feels Worthless: Yes Grandiose Ideas: No Belief In Special Powers: No New/Increased Substance Abuse: No Compulsions: No  Neurologic: Headache: Yes Seizure: No Paresthesias: No  Medical History;  Patient has history of scrotal injury, benign prostrate hypercalcemia, hypertension,  asthma, chronic back pain, chronic fatigue, hypothyroidism and dysfunction.  He is seeing Dr. Ashley Royalty.  Patient has history of back surgery, head trauma and concussion but do not remember the details very well.  He is taking multiple pain medication from pain management.  Psychosocial History: Patient was born and raised in [place].  He's been married for 18 years.  He has 2 children.  He lives with his wife and 2 kids.  Patient reported supportive wife.  His wife work as Rn in [employer].    Outpatient Encounter Prescriptions as of 07/15/2013  Medication Sig  . DULoxetine (CYMBALTA) 60 MG capsule Take 1 capsule (60 mg total) by mouth daily.  Marland Kitchen EXFORGE 5-160 MG per tablet TAKE 1 TABLET BY MOUTH ONCE DAILY  . gabapentin (NEURONTIN) 400 MG capsule Take 1 capsule (400 mg total) by mouth 3 (three) times daily.  . meloxicam (MOBIC) 15 MG tablet Take 1 tablet (15 mg total) by mouth daily.  Marland Kitchen oxyCODONE-acetaminophen (PERCOCET/ROXICET) 5-325 MG per tablet Take 1 tablet by mouth every 4 (four) hours as needed.  Marland Kitchen SOMA 350 MG tablet Take 1 tablet (350 mg total) by mouth at bedtime.  . [DISCONTINUED] DULoxetine (CYMBALTA) 60 MG capsule Take 1 capsule (60 mg total) by mouth daily.  Marland Kitchen levothyroxine (SYNTHROID, LEVOTHROID) 50 MCG tablet Take 1 tablet (50 mcg total) by mouth daily.  . sildenafil (VIAGRA) 100 MG tablet Take 100 mg by mouth daily as needed for erectile dysfunction.  . [DISCONTINUED] methylTESTOSTERone (ANDROID) 10 MG capsule Take 10 mg by mouth  daily.    No results found for this or any previous visit (from the past 72 hour(s)).  Past Psychiatric History/Hospitalization(s) Anxiety: Yes Bipolar Disorder: No Depression: Yes Mania: No Psychosis: No Schizophrenia: No Personality Disorder: No Hospitalization for psychiatric illness: No History of Electroconvulsive Shock Therapy: No Prior Suicide Attempts: No  Physical Exam: Constitutional:  BP 149/88  Pulse 56  Ht 5\' 6"   (1.676 m)  Wt 232 lb (105.235 kg)  BMI 37.46 kg/m2  Musculoskeletal: Strength & Muscle Tone: within normal limits Gait & Station: normal Patient leans: N/A  Mental Status Examination;  Patient is casually dressed and groomed.  He is anxious and maintained fair eye contact.  His speech is slow but coherent.  He described his mood neutral and his affect is mood appropriate.  He denies any auditory or visual hallucination.  He denies any active or passive suicidal thoughts or homicidal thoughts.  His attention and concentration is fair.  His psychomotor activity is normal.  There were no delusions, paranoia or obsession present at this time.  There were no tremors or shakes present at this time.  His fund of knowledge is average.  He is alert and oriented x3.  His insight judgment and impulse control is okay.   Medical Decision Making (Choose Three): Established Problem, Stable/Improving (1), Review of Last Therapy Session (1) and Review of Medication Regimen & Side Effects (2)  Assessment: Axis I: Depressive disorder NOS, mood disorder due to general medical condition  Axis II: Deferred  Axis III: see medical history  Axis IV: Moderate   Plan:  I will continue Cymbalta 60 mg daily.  Patient is tolerating his medications without any side effects.  Recommend to see Nicholas Mcintosh and recommended to schedule appointment with sex therapist.  I will see him again in 3 months unless needed to be seen sooner.  Recommend to cause back if he has any question or any concern.     Antwyne Pingree T., MD 07/15/2013

## 2013-07-15 NOTE — Progress Notes (Signed)
Prescription refilled for Percocet 5-325 mg #90.  

## 2013-07-16 NOTE — Telephone Encounter (Signed)
Prescription noted and ready for pickup

## 2013-07-20 ENCOUNTER — Telehealth (HOSPITAL_COMMUNITY): Payer: Self-pay | Admitting: Hematology

## 2013-07-20 NOTE — Telephone Encounter (Signed)
Left message advising that prescription is ready for pick up, explained that office will be closed on 12-25 through 12-26.

## 2013-08-03 ENCOUNTER — Telehealth: Payer: Self-pay | Admitting: Internal Medicine

## 2013-08-03 ENCOUNTER — Ambulatory Visit: Payer: Self-pay | Admitting: Internal Medicine

## 2013-08-03 ENCOUNTER — Other Ambulatory Visit: Payer: Self-pay

## 2013-08-03 DIAGNOSIS — M549 Dorsalgia, unspecified: Secondary | ICD-10-CM

## 2013-08-03 MED ORDER — OXYCODONE-ACETAMINOPHEN 5-325 MG PO TABS: 1.0000 | ORAL_TABLET | ORAL | Status: DC | PRN

## 2013-08-03 NOTE — Telephone Encounter (Signed)
Prescription issued for Percocet 5/325 mg #90 tabs.

## 2013-08-03 NOTE — Telephone Encounter (Signed)
Patient wishes to have testosterone levels checked.

## 2013-08-03 NOTE — Telephone Encounter (Signed)
Cannot perform test without indication. Will discuss at next appointment.

## 2013-08-03 NOTE — Telephone Encounter (Signed)
No refill screen needed

## 2013-08-03 NOTE — Telephone Encounter (Signed)
Spoke with patient about lab concerns at appointment on 08/04/13

## 2013-08-04 ENCOUNTER — Ambulatory Visit: Payer: 59 | Admitting: Internal Medicine

## 2013-08-04 ENCOUNTER — Encounter: Payer: Self-pay | Admitting: Internal Medicine

## 2013-08-04 VITALS — BP 161/111 | HR 62 | Temp 98.4°F | Resp 18 | Ht 67.75 in | Wt 230.5 lb

## 2013-08-04 DIAGNOSIS — E8881 Metabolic syndrome: Secondary | ICD-10-CM

## 2013-08-04 DIAGNOSIS — Z23 Encounter for immunization: Secondary | ICD-10-CM

## 2013-08-04 DIAGNOSIS — I10 Essential (primary) hypertension: Secondary | ICD-10-CM

## 2013-08-04 MED ORDER — HYDROCHLOROTHIAZIDE 12.5 MG PO CAPS: 12.5000 mg | ORAL_CAPSULE | Freq: Every day | ORAL | Status: DC

## 2013-08-04 NOTE — Progress Notes (Unsigned)
   Subjective:    Patient ID: Nicholas Mcintosh, male    DOB: 1966/05/09, 48 y.o.   MRN: [id number]  HPI: Pt here for follow up visit. He continues to have chronic pain but seems to be coping better since seeing the Psychiatrist and Therapist. He is still very concerned about impotence and states that he still has numbness in the scrotal area. He is reluctant to increase Gabapentin even though he is still having neuropathic pain.    Review of Systems     Objective:   Physical Exam        Assessment & Plan:  1. Elbow pain:   2. HTN: Pt started  on HCTZ 12.5 mg. Re-check BP in 1 week and then in 2 weeks. Recommend BP cuff for home monitoring.  3. Erectile Dsysfunction:  4. Metabolic check Hb A1c syndrome:  5. Immunization:

## 2013-08-07 ENCOUNTER — Ambulatory Visit: Payer: 59 | Admitting: *Deleted

## 2013-08-07 ENCOUNTER — Telehealth: Payer: Self-pay | Admitting: *Deleted

## 2013-08-07 ENCOUNTER — Other Ambulatory Visit: Payer: 59 | Admitting: *Deleted

## 2013-08-07 ENCOUNTER — Encounter: Payer: Self-pay | Admitting: *Deleted

## 2013-08-07 DIAGNOSIS — I10 Essential (primary) hypertension: Secondary | ICD-10-CM

## 2013-08-07 DIAGNOSIS — E8881 Metabolic syndrome: Secondary | ICD-10-CM

## 2013-08-07 DIAGNOSIS — E349 Endocrine disorder, unspecified: Secondary | ICD-10-CM

## 2013-08-07 LAB — BASIC METABOLIC PANEL
BUN: 12 mg/dL (ref 6–23)
CALCIUM: 9.7 mg/dL (ref 8.4–10.5)
CO2: 30 mEq/L (ref 19–32)
CREATININE: 0.92 mg/dL (ref 0.50–1.35)
Chloride: 102 mEq/L (ref 96–112)
Glucose, Bld: 106 mg/dL — ABNORMAL HIGH (ref 70–99)
Potassium: 4.3 mEq/L (ref 3.5–5.3)
Sodium: 136 mEq/L (ref 135–145)

## 2013-08-07 LAB — HEMOGLOBIN A1C
HEMOGLOBIN A1C: 5.3 % (ref <5.7)
Mean Plasma Glucose: 105 mg/dL (ref <117)

## 2013-08-07 LAB — MAGNESIUM: Magnesium: 1.9 mg/dL (ref 1.5–2.5)

## 2013-08-07 NOTE — Telephone Encounter (Signed)
No. He just needs to get a BP cuff and check BP BID and then cal in results.

## 2013-08-07 NOTE — Telephone Encounter (Signed)
Pt in for BP check which was elevated today 153/90 right arm 62 pulse and 155/87 left arm 64 pulse. Patient has concerns about if a weekly appointment is needed to monitor BP. Pt understands he just start new BP medication and will try to monitor his BP at home if weekly appointment is not needed

## 2013-08-08 LAB — TESTOSTERONE: TESTOSTERONE: 224 ng/dL — AB (ref 300–890)

## 2013-08-10 ENCOUNTER — Telehealth: Payer: Self-pay | Admitting: *Deleted

## 2013-08-10 NOTE — Telephone Encounter (Signed)
Spoke with pt to monitor BP as well call readings to office on fridays. Pt agreed

## 2013-08-12 ENCOUNTER — Ambulatory Visit (HOSPITAL_COMMUNITY): Payer: Self-pay | Admitting: Psychiatry

## 2013-08-14 ENCOUNTER — Telehealth: Payer: Self-pay | Admitting: Internal Medicine

## 2013-08-14 DIAGNOSIS — M549 Dorsalgia, unspecified: Secondary | ICD-10-CM

## 2013-08-17 ENCOUNTER — Other Ambulatory Visit: Payer: Self-pay | Admitting: Hematology

## 2013-08-17 MED ORDER — OXYCODONE-ACETAMINOPHEN 5-325 MG PO TABS: 1.0000 | ORAL_TABLET | ORAL | Status: DC | PRN

## 2013-08-17 NOTE — Telephone Encounter (Signed)
Routed to Dr Ashley RoyaltyMatthews for narcotic refill

## 2013-08-17 NOTE — Telephone Encounter (Signed)
Refill request for some narcotics

## 2013-08-17 NOTE — Telephone Encounter (Signed)
Prescription refilled for percocet 5/325 mg #90 to be used every 4 hours as needed.

## 2013-08-28 ENCOUNTER — Telehealth: Payer: Self-pay | Admitting: Internal Medicine

## 2013-08-28 DIAGNOSIS — M549 Dorsalgia, unspecified: Secondary | ICD-10-CM

## 2013-08-28 NOTE — Telephone Encounter (Signed)
Refill request for oxyCODONE-acetaminophen (PERCOCET/ROXICET) 5-325 MG per tablet

## 2013-08-31 MED ORDER — OXYCODONE-ACETAMINOPHEN 5-325 MG PO TABS: 1.0000 | ORAL_TABLET | ORAL | Status: DC | PRN

## 2013-08-31 NOTE — Telephone Encounter (Signed)
Prescription refilled for percocet 5/325 mg #90 to be used every 4 hours as needed.

## 2013-09-02 ENCOUNTER — Ambulatory Visit (HOSPITAL_COMMUNITY): Payer: Self-pay | Admitting: Psychiatry

## 2013-09-02 ENCOUNTER — Telehealth: Payer: Self-pay | Admitting: Internal Medicine

## 2013-09-02 NOTE — Telephone Encounter (Signed)
Called patient to reschedule appoint for Friday, 2/6. Patient would like to reschedule to 2/17 at 11am.

## 2013-09-04 ENCOUNTER — Ambulatory Visit: Payer: Self-pay | Admitting: Family Medicine

## 2013-09-11 ENCOUNTER — Telehealth: Payer: Self-pay | Admitting: Internal Medicine

## 2013-09-11 DIAGNOSIS — M549 Dorsalgia, unspecified: Secondary | ICD-10-CM

## 2013-09-11 MED ORDER — OXYCODONE-ACETAMINOPHEN 5-325 MG PO TABS: 1.0000 | ORAL_TABLET | ORAL | Status: DC | PRN

## 2013-09-11 NOTE — Telephone Encounter (Deleted)
Requesting medication refill for   oxyCODONE-acetaminophen (PERCOCET/ROXICET) 5-325 MG per tablet

## 2013-09-11 NOTE — Telephone Encounter (Signed)
Prescription refilled for percocet 5/325 mg #90 to be used every 4 hours as needed.

## 2013-09-11 NOTE — Telephone Encounter (Signed)
Refill request for percocet/roxicet

## 2013-09-22 ENCOUNTER — Ambulatory Visit: Payer: Self-pay | Admitting: Family Medicine

## 2013-09-23 ENCOUNTER — Encounter: Payer: Self-pay | Admitting: Family Medicine

## 2013-09-23 ENCOUNTER — Ambulatory Visit (INDEPENDENT_AMBULATORY_CARE_PROVIDER_SITE_OTHER): Payer: 59 | Admitting: Family Medicine

## 2013-09-23 VITALS — BP 131/75 | HR 70 | Temp 98.0°F | Resp 18 | Ht 67.0 in | Wt 268.0 lb

## 2013-09-23 DIAGNOSIS — N529 Male erectile dysfunction, unspecified: Secondary | ICD-10-CM

## 2013-09-23 DIAGNOSIS — M549 Dorsalgia, unspecified: Secondary | ICD-10-CM

## 2013-09-23 DIAGNOSIS — E039 Hypothyroidism, unspecified: Secondary | ICD-10-CM

## 2013-09-23 DIAGNOSIS — F411 Generalized anxiety disorder: Secondary | ICD-10-CM

## 2013-09-23 DIAGNOSIS — M25521 Pain in right elbow: Secondary | ICD-10-CM

## 2013-09-23 DIAGNOSIS — M25529 Pain in unspecified elbow: Secondary | ICD-10-CM

## 2013-09-23 NOTE — Progress Notes (Signed)
   Subjective:    Patient ID: Nicholas Mcintosh, male    DOB: 03/08/1966, 48 y.o.   MRN: [id number]  HPI 48 year old patient presents for follow up for chronic pain and hypertension. Patient maintains that back pain is 6-7/10  Intensity on current prescribed pain regimen. Pain is described as a constant ache increased by activity.   Patient complaining of right elbow pain that has increased in severity over the last 2 months. Pain is unrelieved by current medication regimen.  Right elbow pain occurred initially with continued use of cane. Pain occurs daily and is described as throbbing and aching.   Patient states that he takes hypertension medications consistently. Patient watches overall sodium and fat intake.   Review of Systems  Constitutional: Negative.   HENT: Negative for congestion and ear pain.   Eyes: Negative.   Respiratory: Negative.   Cardiovascular: Negative.   Endocrine: Negative.   Genitourinary: Negative.   Musculoskeletal: Positive for back pain, gait problem and myalgias.  Skin: Negative.   Psychiatric/Behavioral: The patient is nervous/anxious.        Objective:   Physical Exam  Constitutional: He is oriented to person, place, and time. He appears well-developed and well-nourished.  HENT:  Head: Normocephalic and atraumatic.  Neck: Normal range of motion.  Cardiovascular: Normal rate, regular rhythm and normal heart sounds.   Pulmonary/Chest: Effort normal and breath sounds normal.  Abdominal: Soft. Bowel sounds are normal.  Musculoskeletal:       Right elbow: He exhibits decreased range of motion. Tenderness found. Lateral epicondyle tenderness noted.  Neurological: He is alert and oriented to person, place, and time.  Skin: Skin is warm and dry.  Psychiatric: He has a normal mood and affect.          Assessment & Plan:  1. Pain management: Patient finds that pain intensity is 6-7/10 daily on prescribed medication regimen. Patient to continue to take  medications as prescribed. Patient is familiar with prescription refill policy.   2. Right elbow pain: Referral for right elbow x-ray. Recommend applying ice to right elbow for 20 minutes 4 times daily interchangably with warm, moist compresses  3. Hypertension: Blood pressure controlled on current medication regimen. Patient states that he is watching sodium and fat intake.  Increase water intake to 6-8 glasses. Discussed recent laboratory values at length. Patient expressed understanding.  4. Anxiety: [facility] and medication management. Patient is not suicidal or homicidal.   5. Hypothyroidism: Controlled on current medication  Preventative care: Follow up in 3 months for completed physical examination and labs

## 2013-09-25 ENCOUNTER — Telehealth: Payer: Self-pay | Admitting: Internal Medicine

## 2013-09-25 ENCOUNTER — Other Ambulatory Visit: Payer: Self-pay | Admitting: Hematology

## 2013-09-25 DIAGNOSIS — M25521 Pain in right elbow: Secondary | ICD-10-CM | POA: Insufficient documentation

## 2013-09-25 DIAGNOSIS — M549 Dorsalgia, unspecified: Secondary | ICD-10-CM

## 2013-09-25 MED ORDER — OXYCODONE-ACETAMINOPHEN 5-325 MG PO TABS
1.0000 | ORAL_TABLET | ORAL | Status: DC | PRN
Start: 2013-09-25 — End: 2013-10-08

## 2013-09-25 NOTE — Telephone Encounter (Signed)
DUPLICATE

## 2013-09-25 NOTE — Telephone Encounter (Signed)
Dr Ashley RoyaltyMatthews,  Please refills request for Percocet as per request.  Thank YOu

## 2013-09-25 NOTE — Telephone Encounter (Signed)
Prescription re-ordered for Percocet 5-325 mg # 90 pills.

## 2013-09-30 ENCOUNTER — Telehealth: Payer: Self-pay

## 2013-09-30 NOTE — Telephone Encounter (Signed)
09/30/2013 9:32 AM Phone (Incoming) [place] ([place]) [phone] (M) Pt called office wanting to speak to you.Plz contact on cell.Thank You

## 2013-10-01 ENCOUNTER — Other Ambulatory Visit: Payer: Self-pay | Admitting: Internal Medicine

## 2013-10-08 ENCOUNTER — Telehealth: Payer: Self-pay | Admitting: Internal Medicine

## 2013-10-08 ENCOUNTER — Other Ambulatory Visit (HOSPITAL_COMMUNITY): Payer: Self-pay | Admitting: *Deleted

## 2013-10-08 DIAGNOSIS — M549 Dorsalgia, unspecified: Secondary | ICD-10-CM

## 2013-10-08 MED ORDER — OXYCODONE-ACETAMINOPHEN 5-325 MG PO TABS: 1.0000 | ORAL_TABLET | ORAL | Status: DC | PRN

## 2013-10-08 NOTE — Telephone Encounter (Signed)
Prescribed Oxycodone-acetaminophen 5-325 mg every 4 hours for moderate to severe pain.  # 90 tablets/15 days. Not to be filled before 10/10/2013.

## 2013-10-08 NOTE — Telephone Encounter (Signed)
[facility]

## 2013-10-12 ENCOUNTER — Ambulatory Visit (INDEPENDENT_AMBULATORY_CARE_PROVIDER_SITE_OTHER): Payer: 59 | Admitting: Psychiatry

## 2013-10-12 DIAGNOSIS — F329 Major depressive disorder, single episode, unspecified: Secondary | ICD-10-CM

## 2013-10-12 DIAGNOSIS — F3289 Other specified depressive episodes: Secondary | ICD-10-CM

## 2013-10-12 NOTE — Progress Notes (Signed)
   THERAPIST PROGRESS NOTE  Session Time: 1:00-1:50   Participation Level: Active   Behavioral Response: CasualAlertEuthymic   Type of Therapy: Individual Therapy   Treatment Goals addressed: emotion regulation   Interventions: Strength-based and Supportive   Summary: [place] is a 48 y.o. male who presents with depression.   Suicidal/Homicidal: Nowithout intent/plan   Therapist Observations/Response: Pt. Reports good mood, talks and laughs appropriately. Pt. Continues to focus on pain management, but has made significant progress in acceptance of his physical health diagnosis. Session focused on the energy that Pt. Is currently directing toward his son's academic and athletic achievement. Pt. Reports that he and his wife have not pursued sex therapy/counseling, but that he is happy in his relationship with his wife and views himself as a supportive husband and partner to his wife. Discussed ways that Pt. Can begin to pursue his personal dreams and interests i.e., driving/travel and having his own business.     Plan: Pt. To continue with CBT focused treatment. Return again in 2-4 weeks.   Diagnosis: Axis I: Depressive Disorder NOS   Axis II: No diagnosis    Wynonia MustyBrown, Jennifer B, COUNS 10/12/2013

## 2013-10-14 ENCOUNTER — Ambulatory Visit (HOSPITAL_COMMUNITY): Payer: Self-pay | Admitting: Psychiatry

## 2013-10-22 ENCOUNTER — Telehealth: Payer: Self-pay | Admitting: Internal Medicine

## 2013-10-22 DIAGNOSIS — M549 Dorsalgia, unspecified: Secondary | ICD-10-CM

## 2013-10-27 MED ORDER — OXYCODONE-ACETAMINOPHEN 5-325 MG PO TABS: 1.0000 | ORAL_TABLET | ORAL | Status: DC | PRN

## 2013-10-28 NOTE — Telephone Encounter (Signed)
Prescribed Percocet 5-325 mg every 4 hours as needed on

## 2013-10-30 ENCOUNTER — Ambulatory Visit (HOSPITAL_COMMUNITY): Payer: Self-pay | Admitting: Psychiatry

## 2013-11-04 ENCOUNTER — Ambulatory Visit (HOSPITAL_COMMUNITY): Payer: Self-pay | Admitting: Psychiatry

## 2013-11-05 ENCOUNTER — Telehealth: Payer: Self-pay | Admitting: Internal Medicine

## 2013-11-05 DIAGNOSIS — M549 Dorsalgia, unspecified: Secondary | ICD-10-CM

## 2013-11-05 MED ORDER — OXYCODONE-ACETAMINOPHEN 5-325 MG PO TABS: 1.0000 | ORAL_TABLET | ORAL | Status: DC | PRN

## 2013-11-05 NOTE — Telephone Encounter (Signed)
Medication refill request for oxyCODONE-acetaminophen (PERCOCET/ROXICET) 5-325 MG per tablet Last office visit 09/23/2013

## 2013-11-05 NOTE — Telephone Encounter (Signed)
Refilled Oxycodone-acetaminophen 5-325 mg every 4 hours for moderate to severe pain #90

## 2013-11-13 ENCOUNTER — Other Ambulatory Visit: Payer: Self-pay

## 2013-11-13 MED ORDER — EXFORGE 5-160 MG PO TABS: ORAL_TABLET | ORAL | Status: DC

## 2013-11-17 ENCOUNTER — Telehealth: Payer: Self-pay | Admitting: Internal Medicine

## 2013-11-17 DIAGNOSIS — M62838 Other muscle spasm: Secondary | ICD-10-CM

## 2013-11-17 NOTE — Telephone Encounter (Signed)
Pharmacy needs to be contacted to fill RX for Exforge with generic. [phone]

## 2013-11-19 ENCOUNTER — Telehealth: Payer: Self-pay | Admitting: Internal Medicine

## 2013-11-19 DIAGNOSIS — M549 Dorsalgia, unspecified: Secondary | ICD-10-CM

## 2013-11-19 MED ORDER — OXYCODONE-ACETAMINOPHEN 5-325 MG PO TABS: 1.0000 | ORAL_TABLET | ORAL | Status: DC | PRN

## 2013-11-19 NOTE — Telephone Encounter (Signed)
Reorder, Oxycodone-acetaminophen 5-325 mg every 4 hours prn for severe pain # 90

## 2013-11-19 NOTE — Telephone Encounter (Signed)
LOV 09/23/2013  Requesting narcotic refill

## 2013-11-20 ENCOUNTER — Telehealth: Payer: Self-pay | Admitting: Internal Medicine

## 2013-11-20 MED ORDER — SOMA 350 MG PO TABS: 350.0000 mg | ORAL_TABLET | Freq: Every day | ORAL | Status: DC

## 2013-11-20 NOTE — Telephone Encounter (Signed)
Reordered Soma 350 mg every night as prescribed #60.

## 2013-11-20 NOTE — Telephone Encounter (Signed)
Pt called to request that generic form of Exforge medication be ordered at the [facility] outpatient pharmacy. Pt states that he will ask the pharmacy to send a fax request today

## 2013-11-23 ENCOUNTER — Encounter (HOSPITAL_COMMUNITY): Payer: Self-pay | Admitting: Psychiatry

## 2013-11-23 ENCOUNTER — Ambulatory Visit (INDEPENDENT_AMBULATORY_CARE_PROVIDER_SITE_OTHER): Payer: 59 | Admitting: Psychiatry

## 2013-11-23 VITALS — BP 156/68 | HR 61 | Ht 67.0 in | Wt 238.2 lb

## 2013-11-23 DIAGNOSIS — F3289 Other specified depressive episodes: Secondary | ICD-10-CM

## 2013-11-23 DIAGNOSIS — F329 Major depressive disorder, single episode, unspecified: Secondary | ICD-10-CM

## 2013-11-23 DIAGNOSIS — F0630 Mood disorder due to known physiological condition, unspecified: Secondary | ICD-10-CM

## 2013-11-23 MED ORDER — DULOXETINE HCL 60 MG PO CPEP: 60.0000 mg | ORAL_CAPSULE | Freq: Every day | ORAL | Status: DC

## 2013-11-23 NOTE — Progress Notes (Signed)
[facility] [id number] Progress Note  [place] [id number] 48 y.o.  11/23/2013 9:17 AM  Chief Complaint:  Medication management and followup.      History of Present Illness:  [place] came for his followup appointment.  He admitted not taking his Cymbalta for past 2 weeks because he ran out and did not call for refill.  He missed the appointment .  He reported Cymbalta was helping his anxiety and lately he is feeling more anxious and nervous.  He is sleeping good.  He denies any crying spells.  He continues to have frustration about chronic pain .  However he is less irritable and less agitated.  His appetite is okay.  He is seeing [place] for counseling but is working very well.  He decided not to see sex therapist at this time because of the money issue.  He is tolerating Cymbalta 60 mg without any side effects.  Patient is not drinking or using any illegal substances.  Denies any recent panic attack.  He denies any hallucination or any tremors.  Recently he has blood work.  His hemoglobin A1c is normal.  Suicidal Ideation: No Plan Formed: No Patient has means to carry out plan: No  Homicidal Ideation: No Plan Formed: No Patient has means to carry out plan: No  Review of Systems: Psychiatric: Agitation: No Hallucination: No Depressed Mood: No Insomnia: No Hypersomnia: No Altered Concentration: No Feels Worthless: No Grandiose Ideas: No Belief In Special Powers: No New/Increased Substance Abuse: No Compulsions: No  Neurologic: Headache: Yes Seizure: No Paresthesias: No  Medical History;  Patient has history of scrotal injury, benign prostrate hypercalcemia, hypertension, asthma, chronic back pain, chronic fatigue, hypothyroidism and dysfunction.  He is seeing Dr. Ashley [place].  Patient has history of back surgery, head trauma and concussion but do not remember the details very well.  He is taking multiple pain medication from pain management.  Psychosocial  History: Patient was born [place]  He's been married for 18 years.  He has 2 children.  He lives with his wife and 2 kids.  Patient reported supportive wife.  His wife work as Rn in [employer] [place].    Outpatient Encounter Prescriptions as of 11/23/2013  Medication Sig  . DULoxetine (CYMBALTA) 60 MG capsule Take 1 capsule (60 mg total) by mouth daily.  Marland Kitchen. EXFORGE 5-160 MG per tablet TAKE 1 TABLET BY MOUTH ONCE DAILY  . gabapentin (NEURONTIN) 400 MG capsule Take 1 capsule (400 mg total) by mouth 3 (three) times daily.  . hydrochlorothiazide (MICROZIDE) 12.5 MG capsule Take 1 capsule (12.5 mg total) by mouth daily.  Marland Kitchen. levothyroxine (SYNTHROID, LEVOTHROID) 50 MCG tablet Take 1 tablet (50 mcg total) by mouth daily.  . meloxicam (MOBIC) 15 MG tablet TAKE 1 TABLET (15 MG TOTAL) BY MOUTH DAILY.  Marland Kitchen. oxyCODONE-acetaminophen (PERCOCET/ROXICET) 5-325 MG per tablet Take 1 tablet by mouth every 4 (four) hours as needed.  . sildenafil (VIAGRA) 100 MG tablet Take 100 mg by mouth daily as needed for erectile dysfunction.  Marland Kitchen. SOMA 350 MG tablet Take 1 tablet (350 mg total) by mouth at bedtime.  . [DISCONTINUED] DULoxetine (CYMBALTA) 60 MG capsule Take 1 capsule (60 mg total) by mouth daily.    No results found for this or any previous visit (from the past 72 hour(s)).  Past Psychiatric History/Hospitalization(s) Anxiety: Yes Bipolar Disorder: No Depression: Yes Mania: No Psychosis: No Schizophrenia: No Personality Disorder: No Hospitalization for psychiatric illness: No History of Electroconvulsive Shock Therapy:  No Prior Suicide Attempts: No  Physical Exam: Constitutional:  BP 156/68  Pulse 61  Ht 5\' 7"  (1.702 m)  Wt 238 lb 3.2 oz (108.047 kg)  BMI 37.30 kg/m2  Musculoskeletal: Strength & Muscle Tone: within normal limits Gait & Station: normal Patient leans: N/A  Mental Status Examination;  Patient is casually dressed and groomed.  He is anxious and maintained fair eye  contact.  His speech is slow but coherent.  He described his mood neutral and his affect is mood appropriate.  He denies any auditory or visual hallucination.  He denies any active or passive suicidal thoughts or homicidal thoughts.  His attention and concentration is fair.  His psychomotor activity is normal.  There were no delusions, paranoia or obsession present at this time.  There were no tremors or shakes present at this time.  His fund of knowledge is average.  He is alert and oriented x3.  His insight judgment and impulse control is okay.   Established Problem, Stable/Improving (1), Review of Last Therapy Session (1) and Review of Medication Regimen & Side Effects (2)  Assessment: Axis I: Depressive disorder NOS, mood disorder due to general medical condition  Axis II: Deferred  Axis III: see medical history  Axis IV: Moderate   Plan:  Encourage medication compliance and keep appointment .  Recommended to restart Cymbalta 60 mg daily since it is helping him. Patient tolerating his medications without any side effects.  Recommend to continue therapy with [place] for coping and social skills.  I will see him again in 3 months unless needed to be seen sooner.  Recommend to cause back if he has any question or any concern.     Vrinda Heckstall T., MD 11/23/2013

## 2013-12-03 ENCOUNTER — Telehealth: Payer: Self-pay | Admitting: Internal Medicine

## 2013-12-03 DIAGNOSIS — M549 Dorsalgia, unspecified: Secondary | ICD-10-CM

## 2013-12-03 MED ORDER — OXYCODONE-ACETAMINOPHEN 5-325 MG PO TABS: 1.0000 | ORAL_TABLET | ORAL | Status: DC | PRN

## 2013-12-03 NOTE — Telephone Encounter (Signed)
Re ordered Percocet 5-325 mg every 4 hours as needed for severe pain #90  Reviewed [place] Substance Reporting system prior to reorder

## 2013-12-03 NOTE — Telephone Encounter (Signed)
Medication refill request for oxyCODONE-acetaminophen (PERCOCET/ROXICET) 5-325 MG per tablet / LOV 09/23/2013

## 2013-12-17 ENCOUNTER — Telehealth: Payer: Self-pay | Admitting: Internal Medicine

## 2013-12-17 ENCOUNTER — Other Ambulatory Visit: Payer: Self-pay | Admitting: Family Medicine

## 2013-12-17 DIAGNOSIS — M549 Dorsalgia, unspecified: Secondary | ICD-10-CM

## 2013-12-17 MED ORDER — OXYCODONE-ACETAMINOPHEN 5-325 MG PO TABS: 1.0000 | ORAL_TABLET | ORAL | Status: DC | PRN

## 2013-12-17 NOTE — Telephone Encounter (Signed)
Prescription filled for percocet 5-325 mg #90

## 2013-12-17 NOTE — Telephone Encounter (Signed)
Soma 350 mg twice daily #60  Reviewed [place] Substance Reporting system prior to reorder

## 2013-12-22 ENCOUNTER — Other Ambulatory Visit: Payer: Self-pay | Admitting: Internal Medicine

## 2014-01-01 ENCOUNTER — Telehealth: Payer: Self-pay | Admitting: Internal Medicine

## 2014-01-01 ENCOUNTER — Other Ambulatory Visit (HOSPITAL_COMMUNITY): Payer: Self-pay | Admitting: *Deleted

## 2014-01-01 DIAGNOSIS — M549 Dorsalgia, unspecified: Secondary | ICD-10-CM

## 2014-01-01 NOTE — Telephone Encounter (Signed)
Erroneous encounter - please disregard.

## 2014-01-04 MED ORDER — OXYCODONE-ACETAMINOPHEN 5-325 MG PO TABS: 1.0000 | ORAL_TABLET | ORAL | Status: DC | PRN

## 2014-01-04 NOTE — Telephone Encounter (Signed)
Rx re-ordered for Percocet 5-325 mg every 4 hours for severe pain #90  Reviewed [place] Substance Reporting system prior to reorder

## 2014-01-06 ENCOUNTER — Telehealth: Payer: Self-pay

## 2014-01-06 ENCOUNTER — Telehealth: Payer: Self-pay | Admitting: Internal Medicine

## 2014-01-06 NOTE — Telephone Encounter (Signed)
Call Documentation      Altha Harm, MD at 01/06/2014  1:39 PM      Status: Signed            Pt needs to make an appointment to have his complaint of fatigue assessed.

## 2014-01-06 NOTE — Telephone Encounter (Signed)
Call Documentation      [place] at 01/06/2014  9:08 AM      Status: Signed            Patient called stating testosterone is 210, and he is feeling very tired. Please Advise

## 2014-01-06 NOTE — Telephone Encounter (Signed)
Patient called stating testosterone is 210, and he is feeling very tired. Please call with instructions.

## 2014-01-06 NOTE — Telephone Encounter (Signed)
Pt needs to make an appointment to have his complaint of fatigue assessed.

## 2014-01-07 ENCOUNTER — Ambulatory Visit (INDEPENDENT_AMBULATORY_CARE_PROVIDER_SITE_OTHER): Payer: 59 | Admitting: Psychiatry

## 2014-01-07 DIAGNOSIS — F3289 Other specified depressive episodes: Secondary | ICD-10-CM

## 2014-01-07 DIAGNOSIS — F329 Major depressive disorder, single episode, unspecified: Secondary | ICD-10-CM

## 2014-01-08 NOTE — Progress Notes (Signed)
   THERAPIST PROGRESS NOTE  Session Time: 2:00-2:50  Participation Level: Active   Behavioral Response: CasualAlertEuthymic   Type of Therapy: Individual Therapy   Treatment Goals addressed: emotion regulation   Interventions: Strength-based and Supportive   Summary: [place] is a 48 y.o. male who presents with depression.   Suicidal/Homicidal: Nowithout intent/plan   Therapist Observations/Response: Pt. Continues to present with positive mood, talks and laughs appropriately. Pt. Reports that he is not currently depressed but continues to have some bad days intermittently. Pt. Reports that he is learning to live with daily pain and continues to receive pain management. Pt. Expressed current concern about low testosterone levels and lack of communication with his urologist about the cause of the low testosterone levels and concerns about how his hormone levels may be impacting his mood. Pt. Was encouraged to continue to follow up with his urologist and primary care provider. Pt. Reports that he continues to focus on his son's activities as a positive distraction from his pain. Pt. Discussed interest in food and cooking and how he cooks to help him to relieve stress and develop feeling of accomplishment and that he is serving a purpose in his family. Pt. Discussed developing attitude of acceptance toward his groin injury and loss of sex with his wife, but reports that he and his wife are in a good place in their relationship. Pt. Discussed desire to drive and take spontaneous trips this summer with his family.  Plan: Pt. To continue with CBT focused treatment. Return again in 2-4 weeks.   Diagnosis: Axis I: Depressive Disorder NOS   Axis II: No diagnosis     Wynonia MustyBrown, Jennifer B, COUNS 01/08/2014

## 2014-01-11 ENCOUNTER — Encounter: Payer: Self-pay | Admitting: Internal Medicine

## 2014-01-11 ENCOUNTER — Other Ambulatory Visit: Payer: Self-pay | Admitting: Internal Medicine

## 2014-01-11 ENCOUNTER — Ambulatory Visit (INDEPENDENT_AMBULATORY_CARE_PROVIDER_SITE_OTHER): Payer: 59 | Admitting: Internal Medicine

## 2014-01-11 VITALS — BP 138/71 | HR 89 | Temp 98.6°F | Resp 20 | Ht 67.0 in | Wt 234.0 lb

## 2014-01-11 DIAGNOSIS — E039 Hypothyroidism, unspecified: Secondary | ICD-10-CM

## 2014-01-11 DIAGNOSIS — Z0000 Encounter for general adult medical examination without abnormal findings: Secondary | ICD-10-CM

## 2014-01-11 DIAGNOSIS — E291 Testicular hypofunction: Secondary | ICD-10-CM

## 2014-01-11 DIAGNOSIS — E349 Endocrine disorder, unspecified: Secondary | ICD-10-CM

## 2014-01-11 DIAGNOSIS — M949 Disorder of cartilage, unspecified: Secondary | ICD-10-CM

## 2014-01-11 DIAGNOSIS — E8881 Metabolic syndrome: Secondary | ICD-10-CM

## 2014-01-11 DIAGNOSIS — R5383 Other fatigue: Secondary | ICD-10-CM

## 2014-01-11 DIAGNOSIS — R5381 Other malaise: Secondary | ICD-10-CM

## 2014-01-11 DIAGNOSIS — M899 Disorder of bone, unspecified: Secondary | ICD-10-CM

## 2014-01-11 DIAGNOSIS — I10 Essential (primary) hypertension: Secondary | ICD-10-CM

## 2014-01-11 DIAGNOSIS — M898X9 Other specified disorders of bone, unspecified site: Secondary | ICD-10-CM

## 2014-01-11 LAB — COMPREHENSIVE METABOLIC PANEL
ALBUMIN: 4.2 g/dL (ref 3.5–5.2)
ALK PHOS: 65 U/L (ref 39–117)
ALT: 33 U/L (ref 0–53)
AST: 30 U/L (ref 0–37)
BUN: 15 mg/dL (ref 6–23)
CO2: 25 meq/L (ref 19–32)
Calcium: 9.3 mg/dL (ref 8.4–10.5)
Chloride: 103 mEq/L (ref 96–112)
Creat: 1.01 mg/dL (ref 0.50–1.35)
GLUCOSE: 100 mg/dL — AB (ref 70–99)
Potassium: 4 mEq/L (ref 3.5–5.3)
SODIUM: 138 meq/L (ref 135–145)
TOTAL PROTEIN: 7.2 g/dL (ref 6.0–8.3)
Total Bilirubin: 0.5 mg/dL (ref 0.2–1.2)

## 2014-01-11 LAB — MAGNESIUM: MAGNESIUM: 2 mg/dL (ref 1.5–2.5)

## 2014-01-11 NOTE — Progress Notes (Signed)
   Subjective:    Patient ID: [place], male    DOB: 08/22/1965, 48 y.o.   MRN: [id number]  HPI: Pt states that he has had low energy. He states that he thinks that he feels that he has less energy since coming off the Testosterone replacement. Pt states that he bought some OTC Testerone but has not taken it.   He is currently in therapy surrounding the loss of sexual function and back pain. He feels that he has come to terms with the Erectile Dysfunction and Back Pain. He has had increased activity. He has no weight gain, diarrhea, or palpitations. Pt states that he has stopped his gabapentin about 3 months ago. He feels that he has been able to achieve a minimal erection since stopping it.       Review of Systems  Constitutional: Negative.   HENT: Negative.   Eyes: Negative.   Respiratory: Negative.   Cardiovascular: Negative.   Gastrointestinal: Negative.   Endocrine: Negative.   Genitourinary: Negative.   Musculoskeletal: Positive for back pain.  Skin: Negative.   Allergic/Immunologic: Negative.   Neurological: Negative.   Hematological: Negative.   Psychiatric/Behavioral: Negative.        Objective:   Physical Exam  Vitals reviewed. Constitutional: He is oriented to person, place, and time. He appears well-developed and well-nourished.  HENT:  Head: Atraumatic.  Eyes: Conjunctivae and EOM are normal. Pupils are equal, round, and reactive to light. No scleral icterus.  Neck: Normal range of motion. Neck supple.  Cardiovascular: Normal rate and regular rhythm.  Exam reveals no gallop and no friction rub.   No murmur heard. Pulmonary/Chest: Effort normal and breath sounds normal. He has no wheezes. He has no rales. He exhibits no tenderness.  Abdominal: Soft. Bowel sounds are normal. He exhibits no mass.  Musculoskeletal: Normal range of motion.  Neurological: He is alert and oriented to person, place, and time.  Skin: Skin is warm and dry.  Psychiatric: He has a  normal mood and affect. His behavior is normal. Judgment and thought content normal.    BP 138/71  Pulse 89  Temp(Src) 98.6 F (37 C) (Oral)  Resp 20  Ht 5\' 7"  (1.702 m)  Wt 234 lb (106.142 kg)  BMI 36.64 kg/m2  TSH 1.83     Assessment & Plan:  1. Fatigue:Check TSH,  Refer to Endocrinologist  2. Low Testosterone: refer to Endocrinologist  3.  Metabolic Syndrome: Check Hb A1C.  4. HTN: BP well controlled. Check renal function and electrolytes and U/A for proteinuria  Labs:CMET, Hb A1c, Magnesium, U/A, Vitamin D, TSH( not ordered. Pt had TSH performed last week at Urologist)  RTC: 1 month for Annual Physical.

## 2014-01-12 LAB — URINALYSIS
Bilirubin Urine: NEGATIVE
GLUCOSE, UA: NEGATIVE mg/dL
HGB URINE DIPSTICK: NEGATIVE
KETONES UR: NEGATIVE mg/dL
Leukocytes, UA: NEGATIVE
Nitrite: NEGATIVE
PH: 8 (ref 5.0–8.0)
PROTEIN: NEGATIVE mg/dL
Specific Gravity, Urine: 1.014 (ref 1.005–1.030)
Urobilinogen, UA: 0.2 mg/dL (ref 0.0–1.0)

## 2014-01-12 LAB — HEMOGLOBIN A1C
Hgb A1c MFr Bld: 5.3 % (ref <5.7)
MEAN PLASMA GLUCOSE: 105 mg/dL (ref <117)

## 2014-01-12 LAB — VITAMIN D 25 HYDROXY (VIT D DEFICIENCY, FRACTURES): Vit D, 25-Hydroxy: 33 ng/mL (ref 30–89)

## 2014-01-13 ENCOUNTER — Telehealth: Payer: Self-pay | Admitting: Internal Medicine

## 2014-01-13 DIAGNOSIS — M549 Dorsalgia, unspecified: Principal | ICD-10-CM

## 2014-01-13 DIAGNOSIS — G8929 Other chronic pain: Secondary | ICD-10-CM

## 2014-01-14 ENCOUNTER — Telehealth: Payer: Self-pay | Admitting: Internal Medicine

## 2014-01-14 ENCOUNTER — Telehealth: Payer: Self-pay

## 2014-01-14 NOTE — Telephone Encounter (Signed)
Patient called to check test results.

## 2014-01-14 NOTE — Telephone Encounter (Signed)
Call Documentation      Nicholas NeighborsCharlene D Thomas at 01/14/2014 11:35 AM      Status: Signed            Patient called to check test results Plz Advise

## 2014-01-19 MED ORDER — OXYCODONE-ACETAMINOPHEN 5-325 MG PO TABS: 1.0000 | ORAL_TABLET | ORAL | Status: DC | PRN

## 2014-01-19 NOTE — Telephone Encounter (Signed)
Prescription reordered for Percocet 5-325 mg # 90.

## 2014-01-20 NOTE — Telephone Encounter (Signed)
Please notify patient that labs are all normal.

## 2014-01-21 ENCOUNTER — Telehealth: Payer: Self-pay

## 2014-01-21 ENCOUNTER — Encounter: Payer: Self-pay | Admitting: Internal Medicine

## 2014-01-21 NOTE — Telephone Encounter (Signed)
Pt was notified w/ VM of his labs.Pt was told to contact office if he had any ?'s

## 2014-01-26 ENCOUNTER — Other Ambulatory Visit: Payer: Self-pay | Admitting: Family Medicine

## 2014-01-27 NOTE — Telephone Encounter (Signed)
Re-ordered Carisprodol 350 mg twice daily #60  Reviewed [place] Substance Reporting system prior to reorder

## 2014-01-28 ENCOUNTER — Other Ambulatory Visit: Payer: Self-pay | Admitting: Internal Medicine

## 2014-01-28 ENCOUNTER — Telehealth: Payer: Self-pay | Admitting: Internal Medicine

## 2014-01-28 DIAGNOSIS — G8929 Other chronic pain: Secondary | ICD-10-CM

## 2014-01-28 DIAGNOSIS — M549 Dorsalgia, unspecified: Principal | ICD-10-CM

## 2014-01-28 NOTE — Telephone Encounter (Signed)
Medication refill request for oxyCODONE-acetaminophen (PERCOCET/ROXICET) 5-325 MG per tablet / LOV 01/11/2014

## 2014-02-01 MED ORDER — OXYCODONE-ACETAMINOPHEN 5-325 MG PO TABS: 1.0000 | ORAL_TABLET | ORAL | Status: DC | PRN

## 2014-02-01 NOTE — Telephone Encounter (Signed)
Rx reordered for Oxycodone-acetaminophen 5-325 mg every 4 hours as needed for severe pain #90. Not to be filled prior to 02/03/2014

## 2014-02-02 ENCOUNTER — Telehealth: Payer: Self-pay

## 2014-02-02 NOTE — Telephone Encounter (Signed)
Pt's [facility] Per request of Practice Mgr J. Hopkins.(2nd time referral has gone over. 1st time was through the Epic Sys.) Pt will be contacted as to date as well as time when paperwork has been received and reviewed.

## 2014-02-04 ENCOUNTER — Telehealth: Payer: Self-pay

## 2014-02-04 NOTE — Telephone Encounter (Signed)
Pt's Appontment W/ [facility] has been set. Date and Time are 06/08/2014@2 :15 P.M. Office is located [address] Ph# [phone] Pt has been Notified of this.

## 2014-02-11 ENCOUNTER — Telehealth: Payer: Self-pay | Admitting: Internal Medicine

## 2014-02-11 DIAGNOSIS — M549 Dorsalgia, unspecified: Principal | ICD-10-CM

## 2014-02-11 DIAGNOSIS — G8929 Other chronic pain: Secondary | ICD-10-CM

## 2014-02-11 MED ORDER — OXYCODONE-ACETAMINOPHEN 5-325 MG PO TABS: 1.0000 | ORAL_TABLET | ORAL | Status: DC | PRN

## 2014-02-11 NOTE — Telephone Encounter (Signed)
Refill request for oxyCODONE-acetaminophen (PERCOCET/ROXICET) 5-325 MG per tablet / LOV 01/11/2014 /

## 2014-02-11 NOTE — Telephone Encounter (Signed)
Prescription rewritten for Percocet 5-325 #90.

## 2014-02-17 ENCOUNTER — Telehealth: Payer: Self-pay | Admitting: Internal Medicine

## 2014-02-17 NOTE — Telephone Encounter (Signed)
Called and left message for patient that he did not need a referral for a chiropractor, he could schedule appointment on his own. Thanks!

## 2014-02-17 NOTE — Telephone Encounter (Signed)
Patient calling for referral to Chiropractor for back pain.

## 2014-02-17 NOTE — Telephone Encounter (Signed)
Patient is asking for a referral. Can this be done, or does patient need to come in for visit? Please advise. Thanks!

## 2014-02-24 ENCOUNTER — Ambulatory Visit (HOSPITAL_COMMUNITY): Payer: Self-pay | Admitting: Psychiatry

## 2014-02-24 ENCOUNTER — Telehealth: Payer: Self-pay | Admitting: Internal Medicine

## 2014-02-24 NOTE — Telephone Encounter (Signed)
Patient unable to see hormone specialist until June 08, 2014. Patient asking if he should keep appointment Thursday, 02/25/14.

## 2014-02-24 NOTE — Telephone Encounter (Signed)
Reschedule in September for HTN 3 month follow up and can do Physical at that time. We can determine if he needs to come back after he has seen the Endocrinologist

## 2014-02-24 NOTE — Telephone Encounter (Signed)
[name],  Patient said the appointment he has scheduled for tomorrow (02/25/2014) Is to follow up from the Hormone specialist but he can not see them until November. Does he still need to see you tomorrow, or does this need to be re-scheduled? Please advise. Thanks!

## 2014-02-25 ENCOUNTER — Telehealth: Payer: Self-pay | Admitting: Internal Medicine

## 2014-02-25 ENCOUNTER — Encounter: Payer: Self-pay | Admitting: Internal Medicine

## 2014-02-25 DIAGNOSIS — M549 Dorsalgia, unspecified: Principal | ICD-10-CM

## 2014-02-25 DIAGNOSIS — G8929 Other chronic pain: Secondary | ICD-10-CM

## 2014-02-25 NOTE — Telephone Encounter (Signed)
Refill request for percocet 5/325mg . LOV 01/11/2014 Please advise. Thanks!

## 2014-02-26 MED ORDER — OXYCODONE-ACETAMINOPHEN 5-325 MG PO TABS: 1.0000 | ORAL_TABLET | ORAL | Status: DC | PRN

## 2014-02-26 NOTE — Telephone Encounter (Signed)
Prescription rewritten for Percocet 5-325 #90.  

## 2014-03-03 ENCOUNTER — Ambulatory Visit (INDEPENDENT_AMBULATORY_CARE_PROVIDER_SITE_OTHER): Payer: 59 | Admitting: Psychiatry

## 2014-03-03 ENCOUNTER — Encounter (HOSPITAL_COMMUNITY): Payer: Self-pay | Admitting: Psychiatry

## 2014-03-03 VITALS — BP 137/76 | HR 56 | Ht 67.0 in | Wt 236.6 lb

## 2014-03-03 DIAGNOSIS — F329 Major depressive disorder, single episode, unspecified: Secondary | ICD-10-CM

## 2014-03-03 DIAGNOSIS — F3289 Other specified depressive episodes: Secondary | ICD-10-CM

## 2014-03-03 MED ORDER — DULOXETINE HCL 60 MG PO CPEP: 60.0000 mg | ORAL_CAPSULE | Freq: Every day | ORAL | Status: DC

## 2014-03-03 NOTE — Progress Notes (Signed)
[facility] [id number] Progress Note  Nicholas Mcintosh [id number] 48 y.o.  03/03/2014 11:49 AM  Chief Complaint:  I am feeling anxious.      History of Present Illness:  Welcome came for his followup appointment.  He is complaining of increased anxiety and nervousness.  He is taking Cymbalta 60 mg but he mentioned that he stopped taking Neurontin which is prescribed by his primary care physician for chronic pain.  Patient told he does not feel that Neurontin is helping his pain.  I explained that Neurontin also helps the anxiety symptoms.  He is frustrated with his chronic illness and unable to perform sex. He is disappointed because  No one had helped him and he is taking Viagra without any luck.  He attended most of his depression is related to his sex performance.  He endorsed some time irritability anger and resentment parts.  However he is sleeping okay.  His appetite is okay.  He denies any agitation, active or passive suicidal thoughts or homicidal thought.  He denies any side effects of medication. He is unable to see [name] on a regular basis because she has no further openings.  He is able to get more appointments next month.  Patient denies any drinking or using any illegal substances.  He denies any paranoia or any hallucination.  He lives with his wife. He was seen primary care physician 6 weeks ago.  He has blood work. His vitamin D, hemoglobin A1c is normal.  Suicidal Ideation: No Plan Formed: No Patient has means to carry out plan: No  Homicidal Ideation: No Plan Formed: No Patient has means to carry out plan: No  Review of Systems: Psychiatric: Agitation: No Hallucination: No Depressed Mood: Yes Insomnia: No Hypersomnia: No Altered Concentration: No Feels Worthless: No Grandiose Ideas: No Belief In Special Powers: No New/Increased Substance Abuse: No Compulsions: No  Neurologic: Headache: Yes Seizure: No Paresthesias: No  Medical History;  Patient has  history of scrotal injury, benign prostrate hypercalcemia, hypertension, asthma, chronic back pain, chronic fatigue, hypothyroidism and dysfunction.  He is seeing [name].  Patient has history of back surgery, head trauma and concussion but do not remember the details very well.  He is taking multiple pain medication from pain management.  Outpatient Encounter Prescriptions as of 03/03/2014  Medication Sig  . carisoprodol (SOMA) 350 MG tablet TAKE 1 TABLET BY MOUTH TWICE A DAY  . doxazosin (CARDURA) 4 MG tablet TAKE 1 TABLET BY MOUTH ONCE DAILY  . DULoxetine (CYMBALTA) 60 MG capsule Take 1 capsule (60 mg total) by mouth daily.  . [DISCONTINUED] DULoxetine (CYMBALTA) 60 MG capsule Take 1 capsule (60 mg total) by mouth daily.  Marland Kitchen. EXFORGE 5-160 MG per tablet TAKE 1 TABLET BY MOUTH ONCE DAILY  . gabapentin (NEURONTIN) 400 MG capsule Take 1 capsule (400 mg total) by mouth 3 (three) times daily.  . hydrochlorothiazide (MICROZIDE) 12.5 MG capsule Take 1 capsule (12.5 mg total) by mouth daily.  Marland Kitchen. levothyroxine (SYNTHROID, LEVOTHROID) 50 MCG tablet Take 1 tablet (50 mcg total) by mouth daily.  . meloxicam (MOBIC) 15 MG tablet TAKE 1 TABLET (15 MG TOTAL) BY MOUTH DAILY.  Marland Kitchen. oxyCODONE-acetaminophen (PERCOCET/ROXICET) 5-325 MG per tablet Take 1 tablet by mouth every 4 (four) hours as needed.  . sildenafil (VIAGRA) 100 MG tablet Take 100 mg by mouth daily as needed for erectile dysfunction.  . [DISCONTINUED] doxazosin (CARDURA) 4 MG tablet TAKE 1 TABLET BY MOUTH ONCE DAILY   Recent Results (from the  past 2160 hour(s))  VITAMIN D 25 HYDROXY     Status: None   Collection Time    01/11/14 12:38 PM      Result Value Ref Range   Vit D, 25-Hydroxy 33  30 - 89 ng/mL   Comment: This assay accurately quantifies Vitamin D, which is the sum of the     25-Hydroxy forms of Vitamin D2 and D3.  Studies have shown that the     optimum concentration of 25-Hydroxy Vitamin D is 30 ng/mL or higher.      Concentrations of  Vitamin D between 20 and 29 ng/mL are considered to     be insufficient and concentrations less than 20 ng/mL are considered     to be deficient for Vitamin D.  COMPREHENSIVE METABOLIC PANEL     Status: Abnormal   Collection Time    01/11/14 12:38 PM      Result Value Ref Range   Sodium 138  135 - 145 mEq/L   Potassium 4.0  3.5 - 5.3 mEq/L   Chloride 103  96 - 112 mEq/L   CO2 25  19 - 32 mEq/L   Glucose, Bld 100 (*) 70 - 99 mg/dL   BUN 15  6 - 23 mg/dL   Creat 1.61  0.96 - 0.45 mg/dL   Total Bilirubin 0.5  0.2 - 1.2 mg/dL   Alkaline Phosphatase 65  39 - 117 U/L   AST 30  0 - 37 U/L   ALT 33  0 - 53 U/L   Total Protein 7.2  6.0 - 8.3 g/dL   Albumin 4.2  3.5 - 5.2 g/dL   Calcium 9.3  8.4 - 40.9 mg/dL  MAGNESIUM     Status: None   Collection Time    01/11/14 12:38 PM      Result Value Ref Range   Magnesium 2.0  1.5 - 2.5 mg/dL  HEMOGLOBIN W1X     Status: None   Collection Time    01/11/14 12:38 PM      Result Value Ref Range   Hemoglobin A1C 5.3  <5.7 %   Comment:                                                                            According to the ADA Clinical Practice Recommendations for 2011, when     HbA1c is used as a screening test:             >=6.5%   Diagnostic of Diabetes Mellitus                (if abnormal result is confirmed)           5.7-6.4%   Increased risk of developing Diabetes Mellitus           References:Diagnosis and Classification of Diabetes Mellitus,Diabetes     Care,2011,34(Suppl 1):S62-S69 and Standards of Medical Care in             Diabetes - 2011,Diabetes Care,2011,34 (Suppl 1):S11-S61.         Mean Plasma Glucose 105  <117 mg/dL  URINALYSIS     Status: None   Collection Time    01/11/14 12:38 PM  Result Value Ref Range   Color, Urine YELLOW  YELLOW   APPearance CLEAR  CLEAR   Specific Gravity, Urine 1.014  1.005 - 1.030   pH 8.0  5.0 - 8.0   Glucose, UA NEG  NEG mg/dL   Bilirubin Urine NEG  NEG   Ketones, ur NEG  NEG mg/dL    Hgb urine dipstick NEG  NEG   Protein, ur NEG  NEG mg/dL   Urobilinogen, UA 0.2  0.0 - 1.0 mg/dL   Nitrite NEG  NEG   Leukocytes, UA NEG  NEG     Past Psychiatric History/Hospitalization(s) Anxiety: Yes Bipolar Disorder: No Depression: Yes Mania: No Psychosis: No Schizophrenia: No Personality Disorder: No Hospitalization for psychiatric illness: No History of Electroconvulsive Shock Therapy: No Prior Suicide Attempts: No  Physical Exam: Constitutional:  BP 137/76  Pulse 56  Ht 5\' 7"  (1.702 m)  Wt 236 lb 9.6 oz (107.321 kg)  BMI 37.05 kg/m2  Musculoskeletal: Strength & Muscle Tone: within normal limits Gait & Station: normal Patient leans: N/A  Mental Status Examination;  Patient is casually dressed and groomed.  He is anxious and maintained fair eye contact.  He described his mood as sad and frustrated.  His affect is mood appropriate. He denies any auditory or visual hallucination.  He denies any active or passive suicidal thoughts or homicidal thoughts.  His attention and concentration is fair.  His psychomotor activity is normal.  There were no delusions, paranoia or obsession present at this time.  There were no tremors or shakes present at this time.  His fund of knowledge is average.  He is alert and oriented x3.  His insight judgment and impulse control is okay.   Established Problem, Stable/Improving (1), Review of Psycho-Social Stressors (1), Review or order clinical lab tests (1), Review and summation of old records (2), Review of Last Therapy Session (1) and Review of Medication Regimen & Side Effects (2)  Assessment: Axis I: Depressive disorder NOS, mood disorder due to general medical condition  Axis II: Deferred  Axis III: see medical history  Axis IV: Moderate   Plan:  I review his records, psychosocial stressors, medication and recent blood work.  Encouraged to restart taking Neurontin because it helped anxiety along with pain.  Encouraged to  discuss with his primary care physician if the pain has not been controlled on  His current medication.  I also offered to increase Cymbalta 90 mg but patient declined and he elected to continue Cymbalta 60 mg.  Patient denies any side effects of medication.  Encouraged to keep appointment with Maricela Bo for counseling and therapy.  Recommended to call us back if he has any question or concern.  Follow up in 3 months.  Patient does not want to come under than 3 months. Time spent 25 minutes.  More than 50% of the time spent in psychoeducation, counseling and coordination of care.  Discuss safety plan that anytime having active suicidal thoughts or homicidal thoughts then patient need to call 911 or go to the local emergency room.   Sulay Brymer T., MD 03/03/2014

## 2014-03-05 ENCOUNTER — Telehealth: Payer: Self-pay | Admitting: Internal Medicine

## 2014-03-05 ENCOUNTER — Other Ambulatory Visit: Payer: Self-pay | Admitting: Family Medicine

## 2014-03-05 NOTE — Telephone Encounter (Signed)
Refill request for Soma 350mg  Please advise. LOV 01/11/2014. Thanks!

## 2014-03-05 NOTE — Telephone Encounter (Signed)
Received a refill request from pharmacy for Viagra 100 mg. LOV 01/11/2014. Is this ok to be refilled? Please advise. Thanks!

## 2014-03-05 NOTE — Telephone Encounter (Signed)
Sent to [name]

## 2014-03-09 ENCOUNTER — Telehealth: Payer: Self-pay | Admitting: Internal Medicine

## 2014-03-09 NOTE — Telephone Encounter (Signed)
Patient would like referral to another Endocrinologist. Deboraha Sprangagle is unable to see patient until November.

## 2014-03-09 NOTE — Telephone Encounter (Signed)
Re-faxed referral to [facility] per patients request. Thanks!

## 2014-03-10 ENCOUNTER — Telehealth: Payer: Self-pay

## 2014-03-10 ENCOUNTER — Telehealth: Payer: Self-pay | Admitting: Internal Medicine

## 2014-03-10 DIAGNOSIS — N521 Erectile dysfunction due to diseases classified elsewhere: Secondary | ICD-10-CM

## 2014-03-10 DIAGNOSIS — M549 Dorsalgia, unspecified: Principal | ICD-10-CM

## 2014-03-10 DIAGNOSIS — G8929 Other chronic pain: Secondary | ICD-10-CM

## 2014-03-10 MED ORDER — SILDENAFIL CITRATE 100 MG PO TABS: 100.0000 mg | ORAL_TABLET | Freq: Every day | ORAL | Status: DC | PRN

## 2014-03-10 NOTE — Telephone Encounter (Signed)
Medication refill request for oxyCODONE-acetaminophen (PERCOCET/ROXICET) 5-325 MG per tablet / LOV 01/11/2014 

## 2014-03-10 NOTE — Telephone Encounter (Signed)
Prescription issued for Viagra 100 mg # 10 to be used as directed.

## 2014-03-10 NOTE — Telephone Encounter (Signed)
Received a refill request for Viagra 100mg . LOV 01/11/2014.Please advise. Thanks!

## 2014-03-10 NOTE — Telephone Encounter (Signed)
Prescription refilled for Soma 350 mg #60 tabs.

## 2014-03-11 MED ORDER — OXYCODONE-ACETAMINOPHEN 5-325 MG PO TABS: 1.0000 | ORAL_TABLET | ORAL | Status: DC | PRN

## 2014-03-11 NOTE — Telephone Encounter (Signed)
Prescription rewritten for Percocet 5-325 #90.

## 2014-03-12 ENCOUNTER — Telehealth: Payer: Self-pay | Admitting: Internal Medicine

## 2014-03-12 ENCOUNTER — Other Ambulatory Visit: Payer: Self-pay | Admitting: Internal Medicine

## 2014-03-15 MED ORDER — AMLODIPINE BESYLATE-VALSARTAN 5-160 MG PO TABS: 1.0000 | ORAL_TABLET | Freq: Every day | ORAL | Status: DC

## 2014-03-15 NOTE — Telephone Encounter (Signed)
Refilled exforge via e-script to pharmacy. Thanks!

## 2014-03-16 ENCOUNTER — Other Ambulatory Visit: Payer: Self-pay

## 2014-03-16 MED ORDER — LEVOTHYROXINE SODIUM 50 MCG PO TABS: 50.0000 ug | ORAL_TABLET | Freq: Every day | ORAL | Status: DC

## 2014-03-16 NOTE — Telephone Encounter (Signed)
Refill levothyroxine. Thanks!

## 2014-03-25 ENCOUNTER — Telehealth: Payer: Self-pay | Admitting: Internal Medicine

## 2014-03-25 DIAGNOSIS — G8929 Other chronic pain: Secondary | ICD-10-CM

## 2014-03-25 DIAGNOSIS — M549 Dorsalgia, unspecified: Principal | ICD-10-CM

## 2014-03-25 MED ORDER — OXYCODONE-ACETAMINOPHEN 5-325 MG PO TABS: 1.0000 | ORAL_TABLET | ORAL | Status: DC | PRN

## 2014-03-25 NOTE — Telephone Encounter (Signed)
Medication refill request for oxyCODONE-acetaminophen (PERCOCET/ROXICET) 5-325 MG per tablet / LOV 01/11/14

## 2014-03-25 NOTE — Telephone Encounter (Signed)
Prescription rewritten for Percocet 5-325 #90. Pt also needs Prescription drug screen at time of Prescription pick-up.

## 2014-03-29 ENCOUNTER — Emergency Department (HOSPITAL_COMMUNITY)
Admission: EM | Admit: 2014-03-29 | Discharge: 2014-03-29 | Disposition: A | Discharge status: Home / Self Care | Payer: 59 | Source: Home / Self Care | Attending: Family Medicine | Admitting: Family Medicine

## 2014-03-29 ENCOUNTER — Telehealth (HOSPITAL_COMMUNITY): Payer: Self-pay | Admitting: *Deleted

## 2014-03-29 ENCOUNTER — Encounter (HOSPITAL_COMMUNITY): Payer: Self-pay | Admitting: Emergency Medicine

## 2014-03-29 DIAGNOSIS — J4521 Mild intermittent asthma with (acute) exacerbation: Secondary | ICD-10-CM

## 2014-03-29 DIAGNOSIS — J45901 Unspecified asthma with (acute) exacerbation: Secondary | ICD-10-CM

## 2014-03-29 MED ORDER — METHYLPREDNISOLONE ACETATE 40 MG/ML IJ SUSP
80.0000 mg | Freq: Once | INTRAMUSCULAR | Status: AC
  Administered 2014-03-29: 80 mg via INTRAMUSCULAR

## 2014-03-29 MED ORDER — ALBUTEROL SULFATE (2.5 MG/3ML) 0.083% IN NEBU
INHALATION_SOLUTION | RESPIRATORY_TRACT | Status: AC
  Filled 2014-03-29: qty 6

## 2014-03-29 MED ORDER — IPRATROPIUM BROMIDE 0.02 % IN SOLN
RESPIRATORY_TRACT | Status: AC
  Filled 2014-03-29: qty 2.5

## 2014-03-29 MED ORDER — ALBUTEROL SULFATE (2.5 MG/3ML) 0.083% IN NEBU
5.0000 mg | INHALATION_SOLUTION | Freq: Once | RESPIRATORY_TRACT | Status: AC
  Administered 2014-03-29: 5 mg via RESPIRATORY_TRACT

## 2014-03-29 MED ORDER — AZITHROMYCIN 250 MG PO TABS: ORAL_TABLET | ORAL | Status: DC

## 2014-03-29 MED ORDER — METHYLPREDNISOLONE ACETATE 80 MG/ML IJ SUSP
INTRAMUSCULAR | Status: AC
  Filled 2014-03-29: qty 1

## 2014-03-29 MED ORDER — GUAIFENESIN-CODEINE 100-10 MG/5ML PO SYRP: 10.0000 mL | ORAL_SOLUTION | Freq: Four times a day (QID) | ORAL | Status: DC | PRN

## 2014-03-29 MED ORDER — IPRATROPIUM BROMIDE 0.02 % IN SOLN
0.5000 mg | Freq: Once | RESPIRATORY_TRACT | Status: AC
  Administered 2014-03-29: 0.5 mg via RESPIRATORY_TRACT

## 2014-03-29 MED ORDER — ALBUTEROL SULFATE (2.5 MG/3ML) 0.083% IN NEBU
INHALATION_SOLUTION | RESPIRATORY_TRACT | Status: AC
  Filled 2014-03-29: qty 3

## 2014-03-29 NOTE — ED Notes (Signed)
Pt  Reports      Cough    And  Congested       With phlegm  Production         Symptoms  For     sev  Weeks            Pt  Reports   Symptoms  Not  releived  By  His   Inhaler

## 2014-03-29 NOTE — Telephone Encounter (Signed)
[facility] for [facility] and ready to pick up

## 2014-03-29 NOTE — ED Provider Notes (Addendum)
CSN: [id number]     Arrival date & time 03/29/14  1821 History   First MD Initiated Contact with Patient 03/29/14 1822     Chief Complaint  Patient presents with  . Cough   (Consider location/radiation/quality/duration/timing/severity/associated sxs/prior Treatment) Patient is a 48 y.o. male presenting with cough. The history is provided by the patient.  Cough Cough characteristics:  Productive Sputum characteristics:  Yellow Severity:  Moderate Onset quality:  Gradual Duration:  2 weeks Progression:  Unchanged Chronicity:  New Smoker: no   Context: upper respiratory infection   Context comment:  Chest cold won't let loose, taking mucinex without relief. Ineffective treatments:  Cough suppressants and beta-agonist inhaler Associated symptoms: shortness of breath and wheezing   Associated symptoms: no fever and no rhinorrhea     Past Medical History  Diagnosis Date  . Asthma   . Hypertension   . Hypothyroidism   . New-onset angina 07/12/2011  . Back pain     Secondary to 2-story fall 2006   Past Surgical History  Procedure Laterality Date  . Surgery on scrotum    . Cardiac catheterization  2012   Family History  Problem Relation Age of Onset  . Hypertension Mother   . Cancer Maternal Aunt     Breast   History  Substance Use Topics  . Smoking status: Never Smoker   . Smokeless tobacco: Not on file  . Alcohol Use: No    Review of Systems  Constitutional: Negative.  Negative for fever.  HENT: Negative.  Negative for rhinorrhea.   Respiratory: Positive for cough, shortness of breath and wheezing.     Allergies  Review of patient's allergies indicates no known allergies.  Home Medications   Prior to Admission medications   Medication Sig Start Date End Date Taking? Authorizing Provider  amLODipine-valsartan (EXFORGE) 5-160 MG per tablet Take 1 tablet by mouth daily. 03/15/14   Altha Harm, MD  azithromycin (ZITHROMAX Z-PAK) 250 MG tablet Take as  directed on pack 03/29/14   Linna Hoff, MD  carisoprodol (SOMA) 350 MG tablet TAKE 1 TABLET BY MOUTH TWICE A DAY 03/05/14   Altha Harm, MD  doxazosin (CARDURA) 4 MG tablet TAKE 1 TABLET BY MOUTH ONCE DAILY 01/28/14   Altha Harm, MD  DULoxetine (CYMBALTA) 60 MG capsule Take 1 capsule (60 mg total) by mouth daily. 03/03/14 03/03/15  Cleotis Nipper, MD  gabapentin (NEURONTIN) 400 MG capsule Take 1 capsule (400 mg total) by mouth 3 (three) times daily. 01/07/13   Grayce Sessions, NP  guaiFENesin-codeine (ROBITUSSIN AC) 100-10 MG/5ML syrup Take 10 mLs by mouth 4 (four) times daily as needed for cough. 03/29/14   Linna Hoff, MD  hydrochlorothiazide (MICROZIDE) 12.5 MG capsule Take 1 capsule (12.5 mg total) by mouth daily. 08/04/13   Altha Harm, MD  levothyroxine (SYNTHROID, LEVOTHROID) 50 MCG tablet Take 1 tablet (50 mcg total) by mouth daily. 03/16/14   Massie Maroon, FNP  meloxicam (MOBIC) 15 MG tablet TAKE 1 TABLET BY MOUTH ONCE DAILY 03/12/14   Altha Harm, MD  oxyCODONE-acetaminophen (PERCOCET/ROXICET) 5-325 MG per tablet Take 1 tablet by mouth every 4 (four) hours as needed. 03/25/14   Altha Harm, MD  sildenafil (VIAGRA) 100 MG tablet Take 1 tablet (100 mg total) by mouth daily as needed for erectile dysfunction. 03/10/14   Altha Harm, MD   BP 140/84  Pulse 78  Temp(Src) 98.6 F (37 C) (Oral)  Resp 18  SpO2 94% Physical Exam  Nursing note and vitals reviewed. Constitutional: He is oriented to person, place, and time. He appears well-developed and well-nourished. No distress.  HENT:  Right Ear: External ear normal.  Left Ear: External ear normal.  Mouth/Throat: Oropharynx is clear and moist.  Eyes: Conjunctivae are normal. Pupils are equal, round, and reactive to light.  Neck: Normal range of motion. Neck supple.  Cardiovascular: Regular rhythm, normal heart sounds and intact distal pulses.   Pulmonary/Chest: Effort normal. He has wheezes. He  has no rales. He exhibits no tenderness.  Lymphadenopathy:    He has no cervical adenopathy.  Neurological: He is alert and oriented to person, place, and time.  Skin: Skin is warm and dry.    ED Course  Procedures (including critical care time) Labs Review Labs Reviewed - No data to display  Imaging Review No results found.   MDM   1. Asthmatic bronchitis, mild intermittent, with acute exacerbation    Sx improved, lings clear at time of d/c.    Linna Hoff, MD 03/29/14 [id number]  Linna Hoff, MD 03/29/14 [phone]

## 2014-03-29 NOTE — Discharge Instructions (Signed)
Take all of medicine, drink lots of fluids,, see your doctor if further problems °

## 2014-03-30 ENCOUNTER — Encounter: Payer: Self-pay | Admitting: Internal Medicine

## 2014-03-30 ENCOUNTER — Ambulatory Visit (INDEPENDENT_AMBULATORY_CARE_PROVIDER_SITE_OTHER): Payer: 59 | Admitting: Internal Medicine

## 2014-03-30 VITALS — BP 110/64 | HR 80 | Temp 98.2°F | Resp 12 | Ht 68.0 in | Wt 237.0 lb

## 2014-03-30 DIAGNOSIS — E291 Testicular hypofunction: Secondary | ICD-10-CM

## 2014-03-30 DIAGNOSIS — E349 Endocrine disorder, unspecified: Secondary | ICD-10-CM

## 2014-03-30 NOTE — Patient Instructions (Signed)
Please come back for labs at 8 am, fasting, in 1 month. Please try to join MyChart for easier communication. I will send you the labs through there. Please come back for a follow-up appointment in 4 months.

## 2014-03-30 NOTE — Progress Notes (Addendum)
Patient ID: Nicholas Mcintosh, male   DOB: 1965/09/24, 48 y.o.   MRN: [id number]  HPI: Nicholas Mcintosh is a 48 y.o.-year-old man, referred by his PCP, Dr. Zigmund Daniel, for management of low testosterone.  He was dx with low testosterone in ~2007 - when seeing Dr. Marlou Sa. He was started on po Androxy >> testosterone was unchanged (100). He stopped this 1 year ago. He developped gynecomastia bilaterally after this, but he is not sure if this was from not exercising anymore.   Reviewed previous testosterone levels: Component     Latest Ref Rng 01/05/2013 08/07/2013  Testosterone     300 - 890 ng/dL 218 (L) 224 (L)   He got an im inj of Solumedrol yesterday for URI.  He admits for decreased libido Has difficulty obtaining or maintaining an erection >> is taking Viagra >> helping a little  Had trauma to testes ~ 15 years ago, had varicocele, had sx for spermatic cord excision of varicocele in ~2000. He also has a h/o epididymitis with hematuria, resolved. Also, h/o prostatitis. No h/o of mumps orchitis/h/o autoimmune ds. No h/o cryptorchidism He grew and went through puberty like his peers No shrinking of testes. No very small testes (<5 ml). No incomplete/delayed sexual development     He had gynecomastia after he started Androxy in the past    No loss of body hair (axillary/pubic)/some decreased need for shaving No height loss No abnormal sense of smell  No hot flushes No vision problems No worst HA of his life No FH of hypogonadism/infertility  No personal h/o infertility - has 3 children No FH of hemochromatosis or pituitary tumors No excessive weight gain or loss.  No chronic diseases. He has chronic back pain post a fall in 2006. He is on chronic opiate tx (Percocet). Also on Soma. He was on neurontin, now off >> erections better. He does not take steroids.  He has BPH - on Cardura No more than 2 drinks a day of alcohol at a time, and this is rarely No anabolic steroids use No herbal  medicines On antidepressants: Cymbalta. He had anginal pain, not anymore.   No AI ds in his family, no FH of MS. He does not have family history of early cardiac disease.  She is seeing urology >> last visit 12/2013 >> DRE and genital exam normal then.   Pt also has hypothyroidism, controlled. Last TSH: 12/30/2013: TSH 1.863  Lab Results  Component Value Date   TSH 1.359 01/05/2013   ROS: Constitutional: no weight gain/loss, no fatigue, no subjective hyperthermia/hypothermia Eyes: no blurry vision, no xerophthalmia ENT: no sore throat, no nodules palpated in throat, no dysphagia/odynophagia, no hoarseness Cardiovascular: no CP/SOB/palpitations/leg swelling Respiratory: no cough/SOB Gastrointestinal: + heartburn/+ N/+ V/+ D/no C Musculoskeletal: no muscle/joint aches Skin: no rashes, + hair loss Neurological: no tremors/numbness/tingling/dizziness Psychiatric: no depression/anxiety + see HPI  Past Medical History  Diagnosis Date  . Asthma   . Hypertension   . Hypothyroidism   . New-onset angina 07/12/2011  . Back pain     Secondary to 2-story fall 2006   Past Surgical History  Procedure Laterality Date  . Surgery on scrotum    . Cardiac catheterization  2012   History   Social History  . Marital Status: Married    Spouse Name: N/A    Number of Children: 4   Occupational History  . Not working at the moment   Social History Main Topics  . Smoking status: Never Smoker   .  Smokeless tobacco: Not on file  . Alcohol Use: No  . Drug Use: No  . Sexual Activity: Yes   Current Outpatient Prescriptions on File Prior to Visit  Medication Sig Dispense Refill  . amLODipine-valsartan (EXFORGE) 5-160 MG per tablet Take 1 tablet by mouth daily.  30 tablet  2  . azithromycin (ZITHROMAX Z-PAK) 250 MG tablet Take as directed on pack  6 tablet  0  . carisoprodol (SOMA) 350 MG tablet TAKE 1 TABLET BY MOUTH TWICE A DAY  60 tablet  0  . doxazosin (CARDURA) 4 MG tablet TAKE 1  TABLET BY MOUTH ONCE DAILY  30 tablet  3  . DULoxetine (CYMBALTA) 60 MG capsule Take 1 capsule (60 mg total) by mouth daily.  90 capsule  0  . gabapentin (NEURONTIN) 400 MG capsule Take 1 capsule (400 mg total) by mouth 3 (three) times daily.  90 capsule  3  . guaiFENesin-codeine (ROBITUSSIN AC) 100-10 MG/5ML syrup Take 10 mLs by mouth 4 (four) times daily as needed for cough.  180 mL  0  . hydrochlorothiazide (MICROZIDE) 12.5 MG capsule Take 1 capsule (12.5 mg total) by mouth daily.  30 capsule  0  . levothyroxine (SYNTHROID, LEVOTHROID) 50 MCG tablet Take 1 tablet (50 mcg total) by mouth daily.  30 tablet  1  . meloxicam (MOBIC) 15 MG tablet TAKE 1 TABLET BY MOUTH ONCE DAILY  30 tablet  1  . oxyCODONE-acetaminophen (PERCOCET/ROXICET) 5-325 MG per tablet Take 1 tablet by mouth every 4 (four) hours as needed.  90 tablet  0  . sildenafil (VIAGRA) 100 MG tablet Take 1 tablet (100 mg total) by mouth daily as needed for erectile dysfunction.  10 tablet  0   No current facility-administered medications on file prior to visit.   No Known Allergies Family History  Problem Relation Age of Onset  . Hypertension Mother   . Cancer Maternal Aunt     Breast   PE: BP 110/64  Pulse 80  Temp(Src) 98.2 F (36.8 C) (Oral)  Resp 12  Ht 5' 8"  (1.727 m)  Wt 237 lb (107.502 kg)  BMI 36.04 kg/m2  SpO2 96% Wt Readings from Last 3 Encounters:  03/30/14 237 lb (107.502 kg)  03/03/14 236 lb 9.6 oz (107.321 kg)  01/11/14 234 lb (106.142 kg)   Constitutional: overweight, in NAD Eyes: PERRLA, EOMI, no exophthalmos ENT: moist mucous membranes, no thyromegaly, no cervical lymphadenopathy Cardiovascular: RRR, No MRG Respiratory: CTA B Gastrointestinal: abdomen soft, NT, ND, BS+ Musculoskeletal: no deformities, strength intact in all 4 Skin: moist, warm, no rashes Neurological: no tremor with outstretched hands, DTR normal in all 4 Genital exam: deferred as this was performed 12/2013 by urologist and was  normal  ASSESSMENT: 1. Low testosterone  2. ED  PLAN:  1. Low total testosterone - unclear etiology, but since it started after his testicular injury ~15 years ago >> likely primary. However, I believe that a secondary component is also possible, in the setting of chronic opiate use and SSRI use.  - we decided to check for the above etiologies and then start supplementation if testosterone is low. We will check: Orders Placed This Encounter  Procedures  . LH  . FSH  . B-HCG Quant  . Testosterone, free, total  . Estradiol  . IBC panel  . Prolactin  . Insulin-like growth factor  . ACTH  . Cortisol  . PSA  These should be checked at 8 am, fasting. He will need to come  back for this, however, in ~1 mo to make sure the Solumedrol that he got yesterday to be cleared from his system. - I d/w him that he needs to continue to see the urologist at least for the DREs that he needs to get every year while on testosterone - we also discussed to start with Androgel and, if not efficient, to switch to im.  2. Erectile dysfunction - improved by PDE5 inhibitor   Component     Latest Ref Rng 04/28/2014  WBC     4.0 - 10.5 K/uL 4.4  RBC     4.22 - 5.81 MIL/uL 4.71  Hemoglobin     13.0 - 17.0 g/dL 12.3 (L)  HCT     39.0 - 52.0 % 37.8 (L)  MCV     78.0 - 100.0 fL 80.3  MCH     26.0 - 34.0 pg 26.1  MCHC     30.0 - 36.0 g/dL 32.5  RDW     11.5 - 15.5 % 16.6 (H)  Platelets     150 - 400 K/uL 228  Neutrophils Relative %     43 - 77 % 53  NEUT#     1.7 - 7.7 K/uL 2.3  Lymphocytes Relative     12 - 46 % 32  Lymphocytes Absolute     0.7 - 4.0 K/uL 1.4  Monocytes Relative     3 - 12 % 7  Monocytes Absolute     0.1 - 1.0 K/uL 0.3  Eosinophils Relative     0 - 5 % 7 (H)  Eosinophils Absolute     0.0 - 0.7 K/uL 0.3  Basophils Relative     0 - 1 % 1  Basophils Absolute     0.0 - 0.1 K/uL 0.0  Smear Review      Criteria for review not met  Iron     42 - 165 ug/dL 76  UIBC      125 - 400 ug/dL 203  TIBC     215 - 435 ug/dL 279  %SAT     20 - 55 % 27  Testosterone     300 - 890 ng/dL 233 (L)  Sex Hormone Binding     13 - 71 nmol/L 11 (L)  Testosterone Free     47.0 - 244.0 pg/mL 72.2  Testosterone-% Free     1.6 - 2.9 % 3.1 (H)  FSH     1.4 - 18.1 mIU/mL 4.3  LH     1.5 - 9.3 mIU/mL 3.2  Prolactin     2.1 - 17.1 ng/mL 4.7  Cortisol, Plasma      8.4  PSA     <=4.00 ng/mL 1.07  hCG, Beta Chain, Quant, S      <2.0   Total testosterone is low, but the free testosterone is normal >> no testosterone supplementation recommended. He will need yearly total + free testosterone checks, fasting, at 8 am. I will see him back on a prn basis if the levels decrease in the future.  Pituitary tests normal. PSA normal. Beta HCG normal >> no signs of testicular tumor. Slight anemia - will defer to PCP.

## 2014-04-07 ENCOUNTER — Telehealth: Payer: Self-pay | Admitting: *Deleted

## 2014-04-07 NOTE — Telephone Encounter (Signed)
Pt called stating that he did not want to wait until Oct (1 month from his appt on 9/1) to have his labs done. Pt is seeing his PCP, Dr Ashley Royalty next week. He wanted to know if he could have the labs done then. I advised him per Dr Charlean Sanfilippo OV note. Pt said his wife is a Engineer, civil (consulting) and he should not have to wait a month from having the Solumedrol. He said he was going to discuss this with Dr Ashley Royalty and have his labs drawn at her office. Advised pt to let us know if she is ok to draw labs and when he has them done. Pt was very adamant about having these done. He said that he does not want to do Androgel. He wants to do the patch. That he has spoken to others who are on this and they have had good results. Pt was persistent. Be advised.

## 2014-04-08 NOTE — Telephone Encounter (Signed)
Called pt and advised him per Dr Gherghe's note. Pt understood.  

## 2014-04-08 NOTE — Telephone Encounter (Signed)
Noted. OK. We can use a testosterone patch if he prefers.

## 2014-04-09 ENCOUNTER — Telehealth: Payer: Self-pay | Admitting: Internal Medicine

## 2014-04-09 DIAGNOSIS — G8929 Other chronic pain: Secondary | ICD-10-CM

## 2014-04-09 DIAGNOSIS — M62838 Other muscle spasm: Secondary | ICD-10-CM

## 2014-04-09 DIAGNOSIS — M549 Dorsalgia, unspecified: Principal | ICD-10-CM

## 2014-04-12 ENCOUNTER — Ambulatory Visit (INDEPENDENT_AMBULATORY_CARE_PROVIDER_SITE_OTHER): Payer: 59 | Admitting: Psychiatry

## 2014-04-12 DIAGNOSIS — M62838 Other muscle spasm: Secondary | ICD-10-CM | POA: Insufficient documentation

## 2014-04-12 DIAGNOSIS — F329 Major depressive disorder, single episode, unspecified: Secondary | ICD-10-CM

## 2014-04-12 DIAGNOSIS — F3289 Other specified depressive episodes: Secondary | ICD-10-CM

## 2014-04-12 MED ORDER — OXYCODONE-ACETAMINOPHEN 5-325 MG PO TABS: 1.0000 | ORAL_TABLET | ORAL | Status: DC | PRN

## 2014-04-12 MED ORDER — CARISOPRODOL 350 MG PO TABS: ORAL_TABLET | ORAL | Status: DC

## 2014-04-12 NOTE — Addendum Note (Signed)
Addended by: Marthann Schiller A on: 04/12/2014 10:06 AM   Modules accepted: Orders

## 2014-04-12 NOTE — Telephone Encounter (Signed)
Prescription refilled for Percocet 5-325 mg #90 and Soma 350 mg #60 tabs.

## 2014-04-12 NOTE — Telephone Encounter (Signed)
Refill request  on percocet 5/325mg  and soma . LOV 01/11/2014. Please advise. Thanks!

## 2014-04-13 NOTE — Progress Notes (Signed)
   THERAPIST PROGRESS NOTE  Session Time: 1:00-2:00   Participation Level: Active   Behavioral Response: CasualAlertEuthymic   Type of Therapy: Individual Therapy   Treatment Goals addressed: emotion regulation   Interventions: Strength-based and Supportive   Summary: Nicholas Mcintosh is a 48 y.o. male who presents with depression.   Suicidal/Homicidal: Nowithout intent/plan   Therapist Observations/Response: Pt. Continues to present with positive mood, talks and laughs appropriately. Pt. Reports that he has been more anxious than usual, but has been managing well. Pt. Continues to work on thought patterns of acceptance of his medical condition and is learning to accept that pain and loss of sexual function will be a part of his life. Pt. Expresses gratitude and acceptance of his wife and their relationship. Significant part of this session was spent processing loss and grief related to loss of best friend over the summer. Pt. Spent time developing perspective related to untimely death and is able to make meaning out of his friend's death. Pt. Reports that he continues to plan a driving trip and needs to continue prioritizing his self-care with sleep, making healthy food choices, and time for prayer and meditation.  Plan: Pt. To continue with CBT focused treatment. Return again in 2-4 weeks.   Diagnosis: Axis I: Depressive Disorder NOS   Axis II: No diagnosis     Wynonia Musty 04/13/2014

## 2014-04-15 ENCOUNTER — Ambulatory Visit (INDEPENDENT_AMBULATORY_CARE_PROVIDER_SITE_OTHER): Payer: 59 | Admitting: Internal Medicine

## 2014-04-15 ENCOUNTER — Encounter: Payer: Self-pay | Admitting: Internal Medicine

## 2014-04-15 VITALS — BP 141/84 | HR 70 | Temp 98.0°F | Resp 18 | Ht 67.0 in | Wt 235.0 lb

## 2014-04-15 DIAGNOSIS — M545 Low back pain, unspecified: Secondary | ICD-10-CM

## 2014-04-15 DIAGNOSIS — R9431 Abnormal electrocardiogram [ECG] [EKG]: Secondary | ICD-10-CM

## 2014-04-15 DIAGNOSIS — I10 Essential (primary) hypertension: Secondary | ICD-10-CM

## 2014-04-15 DIAGNOSIS — F1110 Opioid abuse, uncomplicated: Secondary | ICD-10-CM

## 2014-04-15 DIAGNOSIS — E039 Hypothyroidism, unspecified: Secondary | ICD-10-CM

## 2014-04-15 DIAGNOSIS — F411 Generalized anxiety disorder: Secondary | ICD-10-CM

## 2014-04-15 DIAGNOSIS — F1190 Opioid use, unspecified, uncomplicated: Secondary | ICD-10-CM

## 2014-04-15 DIAGNOSIS — Z0000 Encounter for general adult medical examination without abnormal findings: Secondary | ICD-10-CM | POA: Insufficient documentation

## 2014-04-15 DIAGNOSIS — G894 Chronic pain syndrome: Secondary | ICD-10-CM | POA: Insufficient documentation

## 2014-04-15 LAB — COMPREHENSIVE METABOLIC PANEL
ALBUMIN: 4.4 g/dL (ref 3.5–5.2)
ALT: 27 U/L (ref 0–53)
AST: 27 U/L (ref 0–37)
Alkaline Phosphatase: 66 U/L (ref 39–117)
BUN: 16 mg/dL (ref 6–23)
CALCIUM: 9.4 mg/dL (ref 8.4–10.5)
CHLORIDE: 102 meq/L (ref 96–112)
CO2: 27 meq/L (ref 19–32)
Creat: 0.81 mg/dL (ref 0.50–1.35)
Glucose, Bld: 90 mg/dL (ref 70–99)
POTASSIUM: 3.9 meq/L (ref 3.5–5.3)
SODIUM: 136 meq/L (ref 135–145)
TOTAL PROTEIN: 7.2 g/dL (ref 6.0–8.3)
Total Bilirubin: 0.6 mg/dL (ref 0.2–1.2)

## 2014-04-15 LAB — CBC WITH DIFFERENTIAL/PLATELET
BASOS ABS: 0 10*3/uL (ref 0.0–0.1)
Basophils Relative: 0 % (ref 0–1)
Eosinophils Absolute: 0.2 10*3/uL (ref 0.0–0.7)
Eosinophils Relative: 4 % (ref 0–5)
HCT: 36.3 % — ABNORMAL LOW (ref 39.0–52.0)
Hemoglobin: 12.3 g/dL — ABNORMAL LOW (ref 13.0–17.0)
LYMPHS PCT: 32 % (ref 12–46)
Lymphs Abs: 1.5 10*3/uL (ref 0.7–4.0)
MCH: 26.5 pg (ref 26.0–34.0)
MCHC: 33.9 g/dL (ref 30.0–36.0)
MCV: 78.2 fL (ref 78.0–100.0)
Monocytes Absolute: 0.3 10*3/uL (ref 0.1–1.0)
Monocytes Relative: 6 % (ref 3–12)
NEUTROS ABS: 2.7 10*3/uL (ref 1.7–7.7)
NEUTROS PCT: 58 % (ref 43–77)
Platelets: 327 10*3/uL (ref 150–400)
RBC: 4.64 MIL/uL (ref 4.22–5.81)
RDW: 17.1 % — AB (ref 11.5–15.5)
WBC: 4.7 10*3/uL (ref 4.0–10.5)

## 2014-04-15 LAB — LIPID PANEL
CHOLESTEROL: 131 mg/dL (ref 0–200)
HDL: 38 mg/dL — ABNORMAL LOW (ref >39)
LDL Cholesterol: 59 mg/dL (ref 0–99)
Total CHOL/HDL Ratio: 3.4 Ratio
Triglycerides: 170 mg/dL — ABNORMAL HIGH (ref <150)
VLDL: 34 mg/dL (ref 0–40)

## 2014-04-15 MED ORDER — LEVOTHYROXINE SODIUM 50 MCG PO TABS: 50.0000 ug | ORAL_TABLET | Freq: Every day | ORAL | Status: DC

## 2014-04-15 MED ORDER — MELOXICAM 15 MG PO TABS: 15.0000 mg | ORAL_TABLET | Freq: Every day | ORAL | Status: DC

## 2014-04-15 NOTE — Progress Notes (Signed)
Patient ID: Nicholas Mcintosh, male   DOB: August 15, 1965, 48 y.o.   MRN: [id number]   Nicholas Mcintosh, is a 48 y.o. male  [id number]  [id number]  DOB - 11/27/1965  CC:  Chief Complaint  Patient presents with  . Annual Exam       HPI: Nicholas Mcintosh is a 48 y.o. male here today for Annual Visit. Pt has been seen by the Endocrinologist and he is being worked up for [employer]. He has also been followed by Psychiatry ans is on Cymbalta. Pt states that he feels sleepy with Cymbalta and does not like the way it makes him feel.   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 He has not needed any increase in dose in the past 18 months.  Patient has No headache, No chest pain, No abdominal pain - No Nausea, No new weakness tingling or numbness, No Cough - SOB.  No Known Allergies Past Medical History  Diagnosis Date  . Asthma   . Hypertension   . Hypothyroidism   . New-onset angina 07/12/2011  . Back pain     Secondary to 2-story fall 2006   Current Outpatient Prescriptions on File Prior to Visit  Medication Sig Dispense Refill  . amLODipine-valsartan (EXFORGE) 5-160 MG per tablet Take 1 tablet by mouth daily.  30 tablet  2  . carisoprodol (SOMA) 350 MG tablet TAKE 1 TABLET BY MOUTH TWICE A DAY  60 tablet  0  . doxazosin (CARDURA) 4 MG tablet TAKE 1 TABLET BY MOUTH ONCE DAILY  30 tablet  3  . DULoxetine (CYMBALTA) 60 MG capsule Take 1 capsule (60 mg total) by mouth daily.  90 capsule  0  . levothyroxine (SYNTHROID, LEVOTHROID) 50 MCG tablet Take 1 tablet (50 mcg total) by mouth daily.  30 tablet  1  . meloxicam (MOBIC) 15 MG tablet TAKE 1 TABLET BY MOUTH ONCE DAILY  30 tablet  1  . oxyCODONE-acetaminophen (PERCOCET/ROXICET) 5-325 MG per tablet Take 1 tablet by mouth every 4 (four) hours as needed.  90  tablet  0  . sildenafil (VIAGRA) 100 MG tablet Take 1 tablet (100 mg total) by mouth daily as needed for erectile dysfunction.  10 tablet  0  . azithromycin (ZITHROMAX Z-PAK) 250 MG tablet Take as directed on pack  6 tablet  0  . gabapentin (NEURONTIN) 400 MG capsule Take 1 capsule (400 mg total) by mouth 3 (three) times daily.  90 capsule  3  . guaiFENesin-codeine (ROBITUSSIN AC) 100-10 MG/5ML syrup Take 10 mLs by mouth 4 (four) times daily as needed for cough.  180 mL  0  . hydrochlorothiazide (MICROZIDE) 12.5 MG capsule Take 1 capsule (12.5 mg total) by mouth daily.  30 capsule  0   No current facility-administered medications on file prior to visit.   Family History  Problem Relation Age of Onset  . Hypertension Mother   . Cancer Maternal Aunt     Breast   History   Social History  . Marital Status: Married    Spouse Name: N/A    Number of Children: N/A  . Years of Education: N/A   Occupational History  . Not on file.   Social History Main Topics  . Smoking status: Never Smoker   . Smokeless tobacco: Not on file  .  Alcohol Use: No  . Drug Use: No  . Sexual Activity: Yes   Other Topics Concern  . Not on file   Social History Narrative  . No narrative on file    Review of Systems: Constitutional: Negative for fever, chills, diaphoresis, activity change, appetite change and fatigue. HENT: Negative for ear pain, nosebleeds, congestion, facial swelling, rhinorrhea, neck pain, neck stiffness and ear discharge.  Eyes: Negative for pain, discharge, redness, itching and visual disturbance. Respiratory: Negative for cough, choking, chest tightness, shortness of breath, wheezing and stridor.  Cardiovascular: Negative for chest pain, palpitations and leg swelling. Gastrointestinal: Negative for abdominal distention. Genitourinary: Negative for dysuria, urgency, frequency, hematuria, flank pain, decreased urine volume, difficulty urinating and dyspareunia.  Musculoskeletal:  Negative for back pain, joint swelling, arthralgia and gait problem. Neurological: Negative for dizziness, tremors, seizures, syncope, facial asymmetry, speech difficulty, weakness, light-headedness, numbness and headaches.  Hematological: Negative for adenopathy. Does not bruise/bleed easily. Psychiatric/Behavioral: Negative for hallucinations, behavioral problems, confusion, dysphoric mood, decreased concentration and agitation.    Objective:    Filed Vitals:   04/15/14 1006  BP: 141/84  Pulse: 70  Temp: 98 F (36.7 C)  Resp: 18    Physical Exam: Constitutional: Patient appears well-developed and well-nourished. No distress. HENT: Normocephalic, atraumatic, External right and left ear normal. Oropharynx is clear and moist.  Eyes: Conjunctivae and EOM are normal. PERRLA, no scleral icterus. Neck: Normal ROM. Neck supple. No JVD. No tracheal deviation. No thyromegaly. CVS: RRR, S1/S2 +, no murmurs, no gallops, no carotid bruit.  Pulmonary: Effort and breath sounds normal, no stridor, rhonchi, wheezes, rales.  Abdominal: Soft. BS +, no distension, tenderness, rebound or guarding.  Musculoskeletal: Normal range of motion. No edema and no tenderness.  Lymphadenopathy: No lymphadenopathy noted, cervical, inguinal or axillary Neuro: Alert. Normal reflexes, muscle tone coordination. No cranial nerve deficit. Skin: Skin is warm and dry. No rash noted. Not diaphoretic. No erythema. No pallor. Psychiatric: Normal mood and affect. Behavior, judgment, thought content normal. Rectal: Pt has normal rectal tone. Smooth and normal contours of the prostate. Genitalia: Pt has no enlarged inguinal lymph nodes. Pt refused testicular examination. Reports that he had examination at urologist.  Lab Results  Component Value Date   WBC 3.9* 01/05/2013   HGB 13.1 01/05/2013   HCT 37.2* 01/05/2013   MCV 75.5* 01/05/2013   PLT 215 01/05/2013   Lab Results  Component Value Date   CREATININE 1.01 01/11/2014    BUN 15 01/11/2014   NA 138 01/11/2014   K 4.0 01/11/2014   CL 103 01/11/2014   CO2 25 01/11/2014    Lab Results  Component Value Date   HGBA1C 5.3 01/11/2014   Lipid Panel     Component Value Date/Time   CHOL 118 04/10/2013 0838   TRIG 76 04/10/2013 0838   HDL 25* 04/10/2013 0838   CHOLHDL 4.7 04/10/2013 0838   VLDL 15 04/10/2013 0838   LDLCALC 78 04/10/2013 0838       Assessment and plan:   1. Annual Physical - 12 Lead EKG shows non-specific S-T, T wave abnormalities. - Refer for Cardiology evaluation and stress test. - Digital Rectal examination - Deferred testicular examination per patient request    2. Unspecified hypothyroidism - Pt denies any symptoms of dysfunctional thyroid. He has not been taking Synthroid on an empty stomach thus questionable absorption. - TSH - Synthroid 50 mcg daily  3. Bilateral low back pain without sciatica - Pt's function is adequate with current treatment -  Percocet 5-325 mg every 4 hours as needed for pain - Neurontin 400 mg TID - Soma - Meloxacom  4. Generalized anxiety disorder - Pt under care of Psychiatry  - Cymbalta 60 mg Daily  5. Immunizations - Pt received influenza vaccine  today   Follow-up 3 months for  HTN  The patient was given clear instructions to go to ER or return to medical center if symptoms don't improve, worsen or new problems develop. The patient verbalized understanding. The patient was told to call to get lab results if they haven't heard anything in the next week.     This note has been created with Education officer, environmental. Any transcriptional errors are unintentional.    MATTHEWS,MICHELLE A., MD [facility] [place], [place] [phone]   04/15/2014, 10:34 AM

## 2014-04-16 LAB — HEMOGLOBIN A1C
Hgb A1c MFr Bld: 5.6 % (ref <5.7)
MEAN PLASMA GLUCOSE: 114 mg/dL (ref <117)

## 2014-04-16 LAB — URINALYSIS
Bilirubin Urine: NEGATIVE
GLUCOSE, UA: NEGATIVE mg/dL
Hgb urine dipstick: NEGATIVE
Ketones, ur: NEGATIVE mg/dL
LEUKOCYTES UA: NEGATIVE
Nitrite: NEGATIVE
PROTEIN: NEGATIVE mg/dL
Specific Gravity, Urine: 1.014 (ref 1.005–1.030)
Urobilinogen, UA: 0.2 mg/dL (ref 0.0–1.0)
pH: 8 (ref 5.0–8.0)

## 2014-04-19 LAB — OPIATES/OPIOIDS (LC/MS-MS)
Codeine Urine: NEGATIVE ng/mL (ref <50)
Hydrocodone: NEGATIVE ng/mL (ref <50)
Hydromorphone: NEGATIVE ng/mL (ref <50)
Morphine Urine: NEGATIVE ng/mL (ref <50)
Norhydrocodone, Ur: NEGATIVE ng/mL (ref <50)
Noroxycodone, Ur: 511 ng/mL — AB (ref <50)
Oxycodone, ur: 8166 ng/mL — AB (ref <50)
Oxymorphone: 310 ng/mL — AB (ref <50)

## 2014-04-19 LAB — OXYCODONE, URINE (LC/MS-MS)
Noroxycodone, Ur: 511 ng/mL — AB (ref <50)
Oxycodone, ur: 8166 ng/mL — AB (ref <50)
Oxymorphone: 310 ng/mL — AB (ref <50)

## 2014-04-20 LAB — PRESCRIPTION MONITORING PROFILE (SOLSTAS)
AMPHETAMINE/METH: NEGATIVE ng/mL
BARBITURATE SCREEN, URINE: NEGATIVE ng/mL
BENZODIAZEPINE SCREEN, URINE: NEGATIVE ng/mL
Buprenorphine, Urine: NEGATIVE ng/mL
COCAINE METABOLITES: NEGATIVE ng/mL
CREATININE, URINE: 50.07 mg/dL (ref >20.0)
Cannabinoid Scrn, Ur: NEGATIVE ng/mL
Carisoprodol, Urine: NEGATIVE ng/mL
Fentanyl, Ur: NEGATIVE ng/mL
MDMA URINE: NEGATIVE ng/mL
MEPERIDINE UR: NEGATIVE ng/mL
Methadone Screen, Urine: NEGATIVE ng/mL
Nitrites, Initial: NEGATIVE ug/mL
Propoxyphene: NEGATIVE ng/mL
TAPENTADOLUR: NEGATIVE ng/mL
Tramadol Scrn, Ur: NEGATIVE ng/mL
ZOLPIDEM, URINE: NEGATIVE ng/mL
pH, Initial: 7.8 pH (ref 4.5–8.9)

## 2014-04-21 ENCOUNTER — Telehealth: Payer: Self-pay

## 2014-04-21 NOTE — Telephone Encounter (Signed)
Called and advised patient he will need to [facility] to have labs done. Thanks!

## 2014-04-22 ENCOUNTER — Telehealth: Payer: Self-pay | Admitting: Internal Medicine

## 2014-04-22 DIAGNOSIS — G8929 Other chronic pain: Secondary | ICD-10-CM

## 2014-04-22 DIAGNOSIS — M549 Dorsalgia, unspecified: Principal | ICD-10-CM

## 2014-04-22 NOTE — Telephone Encounter (Signed)
Refill request for percocet 5/325mg. LOV 04/15/2014. Please advise. Thanks!  

## 2014-04-23 MED ORDER — OXYCODONE-ACETAMINOPHEN 5-325 MG PO TABS: 1.0000 | ORAL_TABLET | ORAL | Status: DC | PRN

## 2014-04-23 NOTE — Telephone Encounter (Signed)
Prescription refilled for Percocet 5-325 mg #90.  

## 2014-04-28 ENCOUNTER — Other Ambulatory Visit: Payer: Self-pay | Admitting: Internal Medicine

## 2014-04-28 ENCOUNTER — Other Ambulatory Visit: Payer: Self-pay

## 2014-04-28 LAB — IRON AND TIBC
%SAT: 27 % (ref 20–55)
Iron: 76 ug/dL (ref 42–165)
TIBC: 279 ug/dL (ref 215–435)
UIBC: 203 ug/dL (ref 125–400)

## 2014-04-29 ENCOUNTER — Other Ambulatory Visit: Payer: Self-pay

## 2014-04-29 ENCOUNTER — Encounter: Payer: Self-pay | Admitting: Internal Medicine

## 2014-04-29 LAB — CBC WITH DIFFERENTIAL/PLATELET
BASOS ABS: 0 10*3/uL (ref 0.0–0.1)
BASOS PCT: 1 % (ref 0–1)
EOS PCT: 7 % — AB (ref 0–5)
Eosinophils Absolute: 0.3 10*3/uL (ref 0.0–0.7)
HCT: 37.8 % — ABNORMAL LOW (ref 39.0–52.0)
Hemoglobin: 12.3 g/dL — ABNORMAL LOW (ref 13.0–17.0)
Lymphocytes Relative: 32 % (ref 12–46)
Lymphs Abs: 1.4 10*3/uL (ref 0.7–4.0)
MCH: 26.1 pg (ref 26.0–34.0)
MCHC: 32.5 g/dL (ref 30.0–36.0)
MCV: 80.3 fL (ref 78.0–100.0)
Monocytes Absolute: 0.3 10*3/uL (ref 0.1–1.0)
Monocytes Relative: 7 % (ref 3–12)
Neutro Abs: 2.3 10*3/uL (ref 1.7–7.7)
Neutrophils Relative %: 53 % (ref 43–77)
PLATELETS: 228 10*3/uL (ref 150–400)
RBC: 4.71 MIL/uL (ref 4.22–5.81)
RDW: 16.6 % — AB (ref 11.5–15.5)
WBC: 4.4 10*3/uL (ref 4.0–10.5)

## 2014-04-29 LAB — TESTOSTERONE, FREE, TOTAL, SHBG
SEX HORMONE BINDING: 11 nmol/L — AB (ref 13–71)
TESTOSTERONE FREE: 72.2 pg/mL (ref 47.0–244.0)
TESTOSTERONE: 233 ng/dL — AB (ref 300–890)
Testosterone-% Free: 3.1 % — ABNORMAL HIGH (ref 1.6–2.9)

## 2014-04-29 LAB — PROLACTIN: Prolactin: 4.7 ng/mL (ref 2.1–17.1)

## 2014-04-29 LAB — PSA: PSA: 1.07 ng/mL (ref <=4.00)

## 2014-04-29 LAB — FOLLICLE STIMULATING HORMONE: FSH: 4.3 m[IU]/mL (ref 1.4–18.1)

## 2014-04-29 LAB — HCG, QUANTITATIVE, PREGNANCY

## 2014-04-29 LAB — LUTEINIZING HORMONE: LH: 3.2 m[IU]/mL (ref 1.5–9.3)

## 2014-04-29 LAB — CORTISOL: Cortisol, Plasma: 8.4 ug/dL

## 2014-04-29 LAB — INSULIN-LIKE GROWTH FACTOR: SOMATOMEDIN (IGF-I): 83 ng/mL (ref 55–213)

## 2014-04-30 ENCOUNTER — Telehealth: Payer: Self-pay | Admitting: *Deleted

## 2014-04-30 LAB — ACTH: C206 ACTH: 67 pg/mL — AB (ref 6–50)

## 2014-04-30 NOTE — Telephone Encounter (Signed)
Called pt and advised him per Dr Charlean SanfilippoGherghe's letter. Pt understood. Be advised.

## 2014-05-01 ENCOUNTER — Emergency Department (HOSPITAL_COMMUNITY)
Admission: EM | Admit: 2014-05-01 | Discharge: 2014-05-01 | Disposition: A | Discharge status: Home / Self Care | Payer: 59 | Attending: Emergency Medicine | Admitting: Emergency Medicine

## 2014-05-01 ENCOUNTER — Encounter (HOSPITAL_COMMUNITY): Payer: Self-pay | Admitting: Emergency Medicine

## 2014-05-01 ENCOUNTER — Emergency Department (HOSPITAL_COMMUNITY): Payer: 59

## 2014-05-01 DIAGNOSIS — R062 Wheezing: Secondary | ICD-10-CM | POA: Diagnosis present

## 2014-05-01 DIAGNOSIS — J4541 Moderate persistent asthma with (acute) exacerbation: Secondary | ICD-10-CM | POA: Insufficient documentation

## 2014-05-01 DIAGNOSIS — E039 Hypothyroidism, unspecified: Secondary | ICD-10-CM | POA: Insufficient documentation

## 2014-05-01 DIAGNOSIS — Z79899 Other long term (current) drug therapy: Secondary | ICD-10-CM | POA: Diagnosis not present

## 2014-05-01 DIAGNOSIS — I10 Essential (primary) hypertension: Secondary | ICD-10-CM | POA: Diagnosis not present

## 2014-05-01 DIAGNOSIS — Z9889 Other specified postprocedural states: Secondary | ICD-10-CM | POA: Insufficient documentation

## 2014-05-01 DIAGNOSIS — J209 Acute bronchitis, unspecified: Secondary | ICD-10-CM

## 2014-05-01 MED ORDER — ALBUTEROL SULFATE (2.5 MG/3ML) 0.083% IN NEBU
5.0000 mg | INHALATION_SOLUTION | Freq: Once | RESPIRATORY_TRACT | Status: AC
  Administered 2014-05-01: 5 mg via RESPIRATORY_TRACT
  Filled 2014-05-01: qty 6

## 2014-05-01 MED ORDER — AZITHROMYCIN 250 MG PO TABS: ORAL_TABLET | ORAL | Status: DC

## 2014-05-01 MED ORDER — IPRATROPIUM BROMIDE 0.02 % IN SOLN
0.5000 mg | Freq: Once | RESPIRATORY_TRACT | Status: AC
  Administered 2014-05-01: 0.5 mg via RESPIRATORY_TRACT
  Filled 2014-05-01: qty 2.5

## 2014-05-01 MED ORDER — ALBUTEROL SULFATE HFA 108 (90 BASE) MCG/ACT IN AERS: 2.0000 | INHALATION_SPRAY | RESPIRATORY_TRACT | Status: DC | PRN

## 2014-05-01 MED ORDER — PREDNISONE 20 MG PO TABS
60.0000 mg | ORAL_TABLET | Freq: Once | ORAL | Status: AC
  Administered 2014-05-01: 60 mg via ORAL
  Filled 2014-05-01: qty 3

## 2014-05-01 MED ORDER — PREDNISONE 20 MG PO TABS: ORAL_TABLET | ORAL | Status: DC

## 2014-05-01 NOTE — ED Provider Notes (Signed)
CSN: [id number]     Arrival date & time 05/01/14  4696 History   First MD Initiated Contact with Patient 05/01/14 [id number]     Chief Complaint  Patient presents with  . Wheezing     (Consider location/radiation/quality/duration/timing/severity/associated sxs/prior Treatment) Patient is a 48 y.o. male presenting with wheezing. The history is provided by the patient.  Wheezing Associated symptoms: cough   Associated symptoms: no chest pain, no fever, no headaches, no rash, no shortness of breath and no sore throat   pt w hx asthma, c/o wheezing for the past 3-4 weeks. Moderate. Constant/daily. +non productive cough. Chest has felt continually tight during this period.  No discrete or episodic cp or discomfort. No sore throat, runny nose, or other uri c/o. No fever or chills. No leg pain or swelling. No orthopnea. w asthma, states normally uses inhalers prn only. Non smoker. No prior hospital admission re asthma.      Past Medical History  Diagnosis Date  . Asthma   . Hypertension   . Hypothyroidism   . New-onset angina 07/12/2011  . Back pain     Secondary to 2-story fall 2006   Past Surgical History  Procedure Laterality Date  . Surgery on scrotum    . Cardiac catheterization  2012   Family History  Problem Relation Age of Onset  . Hypertension Mother   . Cancer Maternal Aunt     Breast   History  Substance Use Topics  . Smoking status: Never Smoker   . Smokeless tobacco: Not on file  . Alcohol Use: No    Review of Systems  Constitutional: Negative for fever and chills.  HENT: Negative for sore throat.   Eyes: Negative for redness.  Respiratory: Positive for cough and wheezing. Negative for shortness of breath.   Cardiovascular: Negative for chest pain.  Gastrointestinal: Negative for vomiting, abdominal pain and diarrhea.  Genitourinary: Negative for flank pain.  Musculoskeletal: Negative for back pain and neck pain.  Skin: Negative for rash.  Neurological:  Negative for headaches.  Hematological: Does not bruise/bleed easily.  Psychiatric/Behavioral: Negative for confusion.      Allergies  Review of patient's allergies indicates not on file.  Home Medications   Prior to Admission medications   Medication Sig Start Date End Date Taking? Authorizing Provider  amLODipine-valsartan (EXFORGE) 5-160 MG per tablet Take 1 tablet by mouth daily. 03/15/14   Altha Harm, MD  carisoprodol (SOMA) 350 MG tablet TAKE 1 TABLET BY MOUTH TWICE A DAY 04/12/14   Altha Harm, MD  DULoxetine (CYMBALTA) 60 MG capsule Take 1 capsule (60 mg total) by mouth daily. 03/03/14 03/03/15  Cleotis Nipper, MD  gabapentin (NEURONTIN) 400 MG capsule Take 1 capsule (400 mg total) by mouth 3 (three) times daily. 01/07/13   Grayce Sessions, NP  levothyroxine (SYNTHROID, LEVOTHROID) 50 MCG tablet Take 1 tablet (50 mcg total) by mouth daily. 04/15/14   Altha Harm, MD  meloxicam (MOBIC) 15 MG tablet Take 1 tablet (15 mg total) by mouth at bedtime. 04/15/14   Altha Harm, MD  oxyCODONE-acetaminophen (PERCOCET/ROXICET) 5-325 MG per tablet Take 1 tablet by mouth every 4 (four) hours as needed. 04/23/14   Altha Harm, MD  sildenafil (VIAGRA) 100 MG tablet Take 1 tablet (100 mg total) by mouth daily as needed for erectile dysfunction. 03/10/14   Altha Harm, MD   BP 144/87  Pulse 78  Temp(Src) 97.7 F (36.5 C) (Oral)  Resp 16  SpO2 97% Physical Exam  Nursing note and vitals reviewed. Constitutional: He is oriented to person, place, and time. He appears well-developed and well-nourished. No distress.  HENT:  Head: Atraumatic.  Mouth/Throat: Oropharynx is clear and moist.  Eyes: Conjunctivae are normal. No scleral icterus.  Neck: Neck supple. No JVD present. No tracheal deviation present.  Cardiovascular: Normal rate, regular rhythm, normal heart sounds and intact distal pulses.  Exam reveals no gallop and no friction rub.   No murmur  heard. Pulmonary/Chest: Effort normal. No accessory muscle usage. No respiratory distress. He has wheezes.  Abdominal: Soft. Bowel sounds are normal. He exhibits no distension and no mass. There is no tenderness. There is no rebound and no guarding.  Musculoskeletal: Normal range of motion. He exhibits no edema and no tenderness.  Neurological: He is alert and oriented to person, place, and time.  Skin: Skin is warm and dry. He is not diaphoretic.  Psychiatric: He has a normal mood and affect.    ED Course  Procedures (including critical care time) Labs Review  Dg Chest 2 View  05/01/2014   CLINICAL DATA:  Cough and congestion for 3 weeks, worsening. Some chest pain today with shortness of breath.  EXAM: CHEST  2 VIEW  COMPARISON:  04/06/2011  FINDINGS: Cardiomediastinal silhouette is within normal limits. Lungs are well inflated. Mild bronchitic changes are present. No confluent airspace opacity, pleural effusion, or pneumothorax is identified. No acute osseous abnormality is seen.  IMPRESSION: Mild bronchitic changes.   Electronically Signed   By: Sebastian AcheAllen  Grady   On: 05/01/2014 10:21      MDM  Albuterol and atrovent neb.  pred po.  Cxr.  Reviewed nursing notes and prior charts for additional history.   Pt with asthma, bronchitis.  Additional neb tx.  After nebs, improved air exchange, minimal wheezing. Pt feels much improved and is breathing comfortably.  No chest pain or discomfort.  Pt appears stable for d/c.    Suzi RootsKevin E Kohei Antonellis, MD 05/01/14 1051

## 2014-05-01 NOTE — ED Notes (Signed)
Faint exp. Wheezes persist bilat. Per auscultation--2nd h.h.n. Ordered.  He remains in no distress; and I get him a snack and drink per his request.

## 2014-05-01 NOTE — Discharge Instructions (Signed)
It was our pleasure to provide your ER care today - we hope that you feel better.  Use albuterol treatment every 4 hours as need. Take prednisone as prescribed. Try mucinex as need for cough. Take zithromax (antibiotic) as prescribed. Follow up with primary care doctor in 1 week if symptoms fail to improve/resolve.   Return to ER if worse, new symptoms, fevers, worsening breathing, chest pain, other concern.       Asthma, Acute Bronchospasm Acute bronchospasm caused by asthma is also referred to as an asthma attack. Bronchospasm means your air passages become narrowed. The narrowing is caused by inflammation and tightening of the muscles in the air tubes (bronchi) in your lungs. This can make it hard to breathe or cause you to wheeze and cough. CAUSES Possible triggers are:  Animal dander from the skin, hair, or feathers of animals.  Dust mites contained in house dust.  Cockroaches.  Pollen from trees or grass.  Mold.  Cigarette or tobacco smoke.  Air pollutants such as dust, household cleaners, hair sprays, aerosol sprays, paint fumes, strong chemicals, or strong odors.  Cold air or weather changes. Cold air may trigger inflammation. Winds increase molds and pollens in the air.  Strong emotions such as crying or laughing hard.  Stress.  Certain medicines such as aspirin or beta-blockers.  Sulfites in foods and drinks, such as dried fruits and wine.  Infections or inflammatory conditions, such as a flu, cold, or inflammation of the nasal membranes (rhinitis).  Gastroesophageal reflux disease (GERD). GERD is a condition where stomach acid backs up into your esophagus.  Exercise or strenuous activity. SIGNS AND SYMPTOMS   Wheezing.  Excessive coughing, particularly at night.  Chest tightness.  Shortness of breath. DIAGNOSIS  Your health care provider will ask you about your medical history and perform a physical exam. A chest X-ray or blood testing may be  performed to look for other causes of your symptoms or other conditions that may have triggered your asthma attack. TREATMENT  Treatment is aimed at reducing inflammation and opening up the airways in your lungs. Most asthma attacks are treated with inhaled medicines. These include quick relief or rescue medicines (such as bronchodilators) and controller medicines (such as inhaled corticosteroids). These medicines are sometimes given through an inhaler or a nebulizer. Systemic steroid medicine taken by mouth or given through an IV tube also can be used to reduce the inflammation when an attack is moderate or severe. Antibiotic medicines are only used if a bacterial infection is present.  HOME CARE INSTRUCTIONS   Rest.  Drink plenty of liquids. This helps the mucus to remain thin and be easily coughed up. Only use caffeine in moderation and do not use alcohol until you have recovered from your illness.  Do not smoke. Avoid being exposed to secondhand smoke.  You play a critical role in keeping yourself in good health. Avoid exposure to things that cause you to wheeze or to have breathing problems.  Keep your medicines up-to-date and available. Carefully follow your health care provider's treatment plan.  Take your medicine exactly as prescribed.  When pollen or pollution is bad, keep windows closed and use an air conditioner or go to places with air conditioning.  Asthma requires careful medical care. See your health care provider for a follow-up as advised. If you are more than [redacted] weeks pregnant and you were prescribed any new medicines, let your obstetrician know about the visit and how you are doing. Follow up with  your health care provider as directed.  After you have recovered from your asthma attack, make an appointment with your outpatient doctor to talk about ways to reduce the likelihood of future attacks. If you do not have a doctor who manages your asthma, make an appointment with a  primary care doctor to discuss your asthma. SEEK IMMEDIATE MEDICAL CARE IF:   You are getting worse.  You have trouble breathing. If severe, call your local emergency services (911 in the [place]  You develop chest pain or discomfort.  You are vomiting.  You are not able to keep fluids down.  You are coughing up yellow, green, brown, or bloody sputum.  You have a fever and your symptoms suddenly get worse.  You have trouble swallowing. MAKE SURE YOU:   Understand these instructions.  Will watch your condition.  Will get help right away if you are not doing well or get worse. Document Released: 10/31/2006 Document Revised: 07/21/2013 Document Reviewed: 01/21/2013 [facility] Patient Information 2015 [place], [place]. This information is not intended to replace advice given to you by your health care provider. Make sure you discuss any questions you have with your health care provider.     Acute Bronchitis Bronchitis is when the airways that extend from the windpipe into the lungs get red, puffy, and painful (inflamed). Bronchitis often causes thick spit (mucus) to develop. This leads to a cough. A cough is the most common symptom of bronchitis. In acute bronchitis, the condition usually begins suddenly and goes away over time (usually in 2 weeks). Smoking, allergies, and asthma can make bronchitis worse. Repeated episodes of bronchitis may cause more lung problems. HOME CARE  Rest.  Drink enough fluids to keep your pee (urine) clear or pale yellow (unless you need to limit fluids as told by your doctor).  Only take over-the-counter or prescription medicines as told by your doctor.  Avoid smoking and secondhand smoke. These can make bronchitis worse. If you are a smoker, think about using nicotine gum or skin patches. Quitting smoking will help your lungs heal faster.  Reduce the chance of getting bronchitis again by:  Washing your hands often.  Avoiding people with cold  symptoms.  Trying not to touch your hands to your mouth, nose, or eyes.  Follow up with your doctor as told. GET HELP IF: Your symptoms do not improve after 1 week of treatment. Symptoms include:  Cough.  Fever.  Coughing up thick spit.  Body aches.  Chest congestion.  Chills.  Shortness of breath.  Sore throat. GET HELP RIGHT AWAY IF:   You have an increased fever.  You have chills.  You have severe shortness of breath.  You have bloody thick spit (sputum).  You throw up (vomit) often.  You lose too much body fluid (dehydration).  You have a severe headache.  You faint. MAKE SURE YOU:   Understand these instructions.  Will watch your condition.  Will get help right away if you are not doing well or get worse. Document Released: 01/02/2008 Document Revised: 03/18/2013 Document Reviewed: 01/06/2013 [facility] Patient Information 2015 [place], [place]. This information is not intended to replace advice given to you by your health care provider. Make sure you discuss any questions you have with your health care provider.     Asthma Asthma is a recurring condition in which the airways tighten and narrow. Asthma can make it difficult to breathe. It can cause coughing, wheezing, and shortness of breath. Asthma episodes, also called asthma  attacks, range from minor to life-threatening. Asthma cannot be cured, but medicines and lifestyle changes can help control it. CAUSES Asthma is believed to be caused by inherited (genetic) and environmental factors, but its exact cause is unknown. Asthma may be triggered by allergens, lung infections, or irritants in the air. Asthma triggers are different for each person. Common triggers include:   Animal dander.  Dust mites.  Cockroaches.  Pollen from trees or grass.  Mold.  Smoke.  Air pollutants such as dust, household cleaners, hair sprays, aerosol sprays, paint fumes, strong chemicals, or strong odors.  Cold air,  weather changes, and winds (which increase molds and pollens in the air).  Strong emotional expressions such as crying or laughing hard.  Stress.  Certain medicines (such as aspirin) or types of drugs (such as beta-blockers).  Sulfites in foods and drinks. Foods and drinks that may contain sulfites include dried fruit, potato chips, and sparkling grape juice.  Infections or inflammatory conditions such as the flu, a cold, or an inflammation of the nasal membranes (rhinitis).  Gastroesophageal reflux disease (GERD).  Exercise or strenuous activity. SYMPTOMS Symptoms may occur immediately after asthma is triggered or many hours later. Symptoms include:  Wheezing.  Excessive nighttime or early morning coughing.  Frequent or severe coughing with a common cold.  Chest tightness.  Shortness of breath. DIAGNOSIS  The diagnosis of asthma is made by a review of your medical history and a physical exam. Tests may also be performed. These may include:  Lung function studies. These tests show how much air you breathe in and out.  Allergy tests.  Imaging tests such as X-rays. TREATMENT  Asthma cannot be cured, but it can usually be controlled. Treatment involves identifying and avoiding your asthma triggers. It also involves medicines. There are 2 classes of medicine used for asthma treatment:   Controller medicines. These prevent asthma symptoms from occurring. They are usually taken every day.  Reliever or rescue medicines. These quickly relieve asthma symptoms. They are used as needed and provide short-term relief. Your health care provider will help you create an asthma action plan. An asthma action plan is a written plan for managing and treating your asthma attacks. It includes a list of your asthma triggers and how they may be avoided. It also includes information on when medicines should be taken and when their dosage should be changed. An action plan may also involve the use of a  device called a peak flow meter. A peak flow meter measures how well the lungs are working. It helps you monitor your condition. HOME CARE INSTRUCTIONS   Take medicines only as directed by your health care provider. Speak with your health care provider if you have questions about how or when to take the medicines.  Use a peak flow meter as directed by your health care provider. Record and keep track of readings.  Understand and use the action plan to help minimize or stop an asthma attack without needing to seek medical care.  Control your home environment in the following ways to help prevent asthma attacks:  Do not smoke. Avoid being exposed to secondhand smoke.  Change your heating and air conditioning filter regularly.  Limit your use of fireplaces and wood stoves.  Get rid of pests (such as roaches and mice) and their droppings.  Throw away plants if you see mold on them.  Clean your floors and dust regularly. Use unscented cleaning products.  Try to have someone else vacuum for  you regularly. Stay out of rooms while they are being vacuumed and for a short while afterward. If you vacuum, use a dust mask from a hardware store, a double-layered or microfilter vacuum cleaner bag, or a vacuum cleaner with a HEPA filter.  Replace carpet with wood, tile, or vinyl flooring. Carpet can trap dander and dust.  Use allergy-proof pillows, mattress covers, and box spring covers.  Wash bed sheets and blankets every week in hot water and dry them in a dryer.  Use blankets that are made of polyester or cotton.  Clean bathrooms and kitchens with bleach. If possible, have someone repaint the walls in these rooms with mold-resistant paint. Keep out of the rooms that are being cleaned and painted.  Wash hands frequently. SEEK MEDICAL CARE IF:   You have wheezing, shortness of breath, or a cough even if taking medicine to prevent attacks.  The colored mucus you cough up (sputum) is thicker  than usual.  Your sputum changes from clear or white to yellow, green, gray, or bloody.  You have any problems that may be related to the medicines you are taking (such as a rash, itching, swelling, or trouble breathing).  You are using a reliever medicine more than 2-3 times per week.  Your peak flow is still at 50-79% of your personal best after following your action plan for 1 hour.  You have a fever. SEEK IMMEDIATE MEDICAL CARE IF:   You seem to be getting worse and are unresponsive to treatment during an asthma attack.  You are short of breath even at rest.  You get short of breath when doing very little physical activity.  You have difficulty eating, drinking, or talking due to asthma symptoms.  You develop chest pain.  You develop a fast heartbeat.  You have a bluish color to your lips or fingernails.  You are light-headed, dizzy, or faint.  Your peak flow is less than 50% of your personal best. MAKE SURE YOU:   Understand these instructions.  Will watch your condition.  Will get help right away if you are not doing well or get worse. Document Released: 07/16/2005 Document Revised: 11/30/2013 Document Reviewed: 02/12/2013 [facility] Patient Information 2015 [place], [place]. This information is not intended to replace advice given to you by your health care provider. Make sure you discuss any questions you have with your health care provider.  Asthma Asthma is a recurring condition in which the airways tighten and narrow. Asthma can make it difficult to breathe. It can cause coughing, wheezing, and shortness of breath. Asthma episodes, also called asthma attacks, range from minor to life-threatening. Asthma cannot be cured, but medicines and lifestyle changes can help control it. CAUSES Asthma is believed to be caused by inherited (genetic) and environmental factors, but its exact cause is unknown. Asthma may be triggered by allergens, lung infections, or irritants in  the air. Asthma triggers are different for each person. Common triggers include:   Animal dander.  Dust mites.  Cockroaches.  Pollen from trees or grass.  Mold.  Smoke.  Air pollutants such as dust, household cleaners, hair sprays, aerosol sprays, paint fumes, strong chemicals, or strong odors.  Cold air, weather changes, and winds (which increase molds and pollens in the air).  Strong emotional expressions such as crying or laughing hard.  Stress.  Certain medicines (such as aspirin) or types of drugs (such as beta-blockers).  Sulfites in foods and drinks. Foods and drinks that may contain sulfites include dried fruit,  potato chips, and sparkling grape juice.  Infections or inflammatory conditions such as the flu, a cold, or an inflammation of the nasal membranes (rhinitis).  Gastroesophageal reflux disease (GERD).  Exercise or strenuous activity. SYMPTOMS Symptoms may occur immediately after asthma is triggered or many hours later. Symptoms include:  Wheezing.  Excessive nighttime or early morning coughing.  Frequent or severe coughing with a common cold.  Chest tightness.  Shortness of breath. DIAGNOSIS  The diagnosis of asthma is made by a review of your medical history and a physical exam. Tests may also be performed. These may include:  Lung function studies. These tests show how much air you breathe in and out.  Allergy tests.  Imaging tests such as X-rays. TREATMENT  Asthma cannot be cured, but it can usually be controlled. Treatment involves identifying and avoiding your asthma triggers. It also involves medicines. There are 2 classes of medicine used for asthma treatment:   Controller medicines. These prevent asthma symptoms from occurring. They are usually taken every day.  Reliever or rescue medicines. These quickly relieve asthma symptoms. They are used as needed and provide short-term relief. Your health care provider will help you create an  asthma action plan. An asthma action plan is a written plan for managing and treating your asthma attacks. It includes a list of your asthma triggers and how they may be avoided. It also includes information on when medicines should be taken and when their dosage should be changed. An action plan may also involve the use of a device called a peak flow meter. A peak flow meter measures how well the lungs are working. It helps you monitor your condition. HOME CARE INSTRUCTIONS   Take medicines only as directed by your health care provider. Speak with your health care provider if you have questions about how or when to take the medicines.  Use a peak flow meter as directed by your health care provider. Record and keep track of readings.  Understand and use the action plan to help minimize or stop an asthma attack without needing to seek medical care.  Control your home environment in the following ways to help prevent asthma attacks:  Do not smoke. Avoid being exposed to secondhand smoke.  Change your heating and air conditioning filter regularly.  Limit your use of fireplaces and wood stoves.  Get rid of pests (such as roaches and mice) and their droppings.  Throw away plants if you see mold on them.  Clean your floors and dust regularly. Use unscented cleaning products.  Try to have someone else vacuum for you regularly. Stay out of rooms while they are being vacuumed and for a short while afterward. If you vacuum, use a dust mask from a hardware store, a double-layered or microfilter vacuum cleaner bag, or a vacuum cleaner with a HEPA filter.  Replace carpet with wood, tile, or vinyl flooring. Carpet can trap dander and dust.  Use allergy-proof pillows, mattress covers, and box spring covers.  Wash bed sheets and blankets every week in hot water and dry them in a dryer.  Use blankets that are made of polyester or cotton.  Clean bathrooms and kitchens with bleach. If possible, have  someone repaint the walls in these rooms with mold-resistant paint. Keep out of the rooms that are being cleaned and painted.  Wash hands frequently. SEEK MEDICAL CARE IF:   You have wheezing, shortness of breath, or a cough even if taking medicine to prevent attacks.  The colored  mucus you cough up (sputum) is thicker than usual.  Your sputum changes from clear or white to yellow, green, gray, or bloody.  You have any problems that may be related to the medicines you are taking (such as a rash, itching, swelling, or trouble breathing).  You are using a reliever medicine more than 2-3 times per week.  Your peak flow is still at 50-79% of your personal best after following your action plan for 1 hour.  You have a fever. SEEK IMMEDIATE MEDICAL CARE IF:   You seem to be getting worse and are unresponsive to treatment during an asthma attack.  You are short of breath even at rest.  You get short of breath when doing very little physical activity.  You have difficulty eating, drinking, or talking due to asthma symptoms.  You develop chest pain.  You develop a fast heartbeat.  You have a bluish color to your lips or fingernails.  You are light-headed, dizzy, or faint.  Your peak flow is less than 50% of your personal best. MAKE SURE YOU:   Understand these instructions.  Will watch your condition.  Will get help right away if you are not doing well or get worse. Document Released: 07/16/2005 Document Revised: 11/30/2013 Document Reviewed: 02/12/2013 [facility] Patient Information 2015 [place], [place]. This information is not intended to replace advice given to you by your health care provider. Make sure you discuss any questions you have with your health care provider.

## 2014-05-01 NOTE — ED Notes (Signed)
He c/o congested cough, plus wheezing and intermittent chest "tightness" x 3 weeks.  He further states he was seen at a [facility] a couple of weeks ago and they "gave me a shot and a Z-pack" with no resolution of symptoms.  His skin is normal, warm and dry and he is minimally short of breath.

## 2014-05-04 ENCOUNTER — Telehealth: Payer: Self-pay

## 2014-05-04 NOTE — Telephone Encounter (Signed)
[facility]. Patient is refusing cardiology appointment at this time. Thanks!

## 2014-05-06 ENCOUNTER — Ambulatory Visit: Payer: Self-pay | Admitting: Internal Medicine

## 2014-05-07 ENCOUNTER — Telehealth: Payer: Self-pay | Admitting: Internal Medicine

## 2014-05-07 DIAGNOSIS — G8929 Other chronic pain: Secondary | ICD-10-CM

## 2014-05-07 DIAGNOSIS — M549 Dorsalgia, unspecified: Principal | ICD-10-CM

## 2014-05-07 MED ORDER — OXYCODONE-ACETAMINOPHEN 5-325 MG PO TABS: 1.0000 | ORAL_TABLET | ORAL | Status: DC | PRN

## 2014-05-07 NOTE — Telephone Encounter (Signed)
Refill Request for percocet 5/325mg . LOV 04/15/2014. Please advise. Thanks!

## 2014-05-07 NOTE — Telephone Encounter (Signed)
Prescription refilled for Percocet 5-325 mg #90.

## 2014-05-10 ENCOUNTER — Ambulatory Visit (HOSPITAL_COMMUNITY): Payer: Self-pay | Admitting: Psychiatry

## 2014-05-21 ENCOUNTER — Telehealth: Payer: Self-pay | Admitting: Internal Medicine

## 2014-05-21 DIAGNOSIS — M62830 Muscle spasm of back: Secondary | ICD-10-CM

## 2014-05-21 DIAGNOSIS — G8929 Other chronic pain: Secondary | ICD-10-CM

## 2014-05-21 DIAGNOSIS — M549 Dorsalgia, unspecified: Principal | ICD-10-CM

## 2014-05-21 NOTE — Telephone Encounter (Signed)
Refill request for percocet 5/325mg  and soma 350mg . lov 04/15/2014. Please advise. Thanks!

## 2014-05-25 MED ORDER — CARISOPRODOL 350 MG PO TABS: 350.0000 mg | ORAL_TABLET | Freq: Two times a day (BID) | ORAL | Status: DC

## 2014-05-25 MED ORDER — OXYCODONE-ACETAMINOPHEN 5-325 MG PO TABS: 1.0000 | ORAL_TABLET | ORAL | Status: DC | PRN

## 2014-05-25 NOTE — Telephone Encounter (Signed)
Prescri[ption filled for Soma 350 mg #60 and Percocet 5-325 mg #90

## 2014-06-03 ENCOUNTER — Encounter (HOSPITAL_COMMUNITY): Payer: Self-pay | Admitting: Psychiatry

## 2014-06-03 ENCOUNTER — Ambulatory Visit (INDEPENDENT_AMBULATORY_CARE_PROVIDER_SITE_OTHER): Payer: 59 | Admitting: Psychiatry

## 2014-06-03 VITALS — BP 149/78 | HR 56 | Ht 66.75 in | Wt 234.4 lb

## 2014-06-03 DIAGNOSIS — F329 Major depressive disorder, single episode, unspecified: Secondary | ICD-10-CM

## 2014-06-03 DIAGNOSIS — F32A Depression, unspecified: Secondary | ICD-10-CM

## 2014-06-03 DIAGNOSIS — F0630 Mood disorder due to known physiological condition, unspecified: Secondary | ICD-10-CM

## 2014-06-03 MED ORDER — DULOXETINE HCL 60 MG PO CPEP: 60.0000 mg | ORAL_CAPSULE | Freq: Every day | ORAL | Status: DC

## 2014-06-03 NOTE — Progress Notes (Signed)
[place] Progress Note  Nicholas Mcintosh [id number] 48 y.o.  06/03/2014 11:10 AM  Chief Complaint:  Medication management and follow-up.      History of Present Illness:  [name] came for his followup appointment.  He is taking Cymbalta 60 mg daily.  Recently he has been very sick and having bronchitis and needed multiple times antibiotic.  Overall his anxiety is under control.  He is not taking Neurontin because he does not feel it helps the pain.  He is seeing [name] for counseling.  Patient denies any major panic attack.  He denies any side effects of Cymbalta.  Sometime he is complaining of poor sleep because of chronic pain.  He denies any anger, irritability, severe mood swing or any paranoia.  He admitted some time getting frustrated with his chronic illness and unable to perform sex.  He was getting testosterone however his physician recommended to stop because of potential side effects and lack of efficacy.  Patient denies any drinking or using any illegal substances.  He lives with his wife.  His appetite is okay.  His vitals are stable.  Suicidal Ideation: No Plan Formed: No Patient has means to carry out plan: No  Homicidal Ideation: No Plan Formed: No Patient has means to carry out plan: No   Review of Systems  Constitutional: Positive for malaise/fatigue.  Musculoskeletal:       Chronic pain  Skin: Negative.   Psychiatric/Behavioral: Positive for depression. The patient is nervous/anxious and has insomnia.     Psychiatric: Agitation: No Hallucination: No Depressed Mood: Yes Insomnia: No Hypersomnia: No Altered Concentration: No Feels Worthless: No Grandiose Ideas: No Belief In Special Powers: No New/Increased Substance Abuse: No Compulsions: No  Neurologic: Headache: Yes Seizure: No Paresthesias: No  Medical History;  Patient has history of scrotal injury, benign prostrate hypercalcemia, hypertension, asthma, chronic back pain,  chronic fatigue, hypothyroidism and dysfunction.  He is seeing Dr. Zigmund Daniel.  Patient has history of back surgery, head trauma and concussion but do not remember the details very well.  He is taking multiple pain medication from pain management.  Outpatient Encounter Prescriptions as of 06/03/2014  Medication Sig  . albuterol (PROVENTIL HFA;VENTOLIN HFA) 108 (90 BASE) MCG/ACT inhaler Inhale 1 puff into the lungs every 6 (six) hours as needed for wheezing or shortness of breath.  Marland Kitchen albuterol (PROVENTIL HFA;VENTOLIN HFA) 108 (90 BASE) MCG/ACT inhaler Inhale 2 puffs into the lungs every 4 (four) hours as needed for wheezing or shortness of breath.  Marland Kitchen amLODipine-valsartan (EXFORGE) 5-160 MG per tablet Take 1 tablet by mouth daily.  . carisoprodol (SOMA) 350 MG tablet Take 1 tablet (350 mg total) by mouth 2 (two) times daily.  . DULoxetine (CYMBALTA) 60 MG capsule Take 1 capsule (60 mg total) by mouth daily.  Marland Kitchen levothyroxine (SYNTHROID, LEVOTHROID) 50 MCG tablet Take 1 tablet (50 mcg total) by mouth daily.  . meloxicam (MOBIC) 15 MG tablet Take 1 tablet (15 mg total) by mouth at bedtime.  Marland Kitchen oxyCODONE-acetaminophen (PERCOCET/ROXICET) 5-325 MG per tablet Take 1 tablet by mouth every 4 (four) hours as needed.  . sildenafil (VIAGRA) 100 MG tablet Take 1 tablet (100 mg total) by mouth daily as needed for erectile dysfunction.  . [DISCONTINUED] azithromycin (ZITHROMAX Z-PAK) 250 MG tablet Take as directed. 2 tablets a day today, then one tablet a day for 4 days  . [DISCONTINUED] DULoxetine (CYMBALTA) 60 MG capsule   . [DISCONTINUED] guaiFENesin (MUCINEX) 600 MG 12 hr tablet Take  600 mg by mouth 2 (two) times daily as needed for to loosen phlegm.  . [DISCONTINUED] predniSONE (DELTASONE) 20 MG tablet 3 po once a day for 2 days, then 2 po once a day for 3 days, then 1 po once a day for 3 days   Recent Results (from the past 2160 hour(s))  Prescription Monitoring Profile (17)-[facility]     Status: None    Collection Time: 04/15/14 10:15 AM  Result Value Ref Range   Creatinine, Urine 50.07 >20.0 mg/dL   pH, Initial 7.8 4.5 - 8.9 pH   Nitrites, Initial NEG Cutoff:200 ug/mL   Amphetamine/Meth NEG Cutoff:500 ng/mL   Barbiturate Screen, Urine NEG Cutoff:200 ng/mL   Benzodiazepine Screen, Urine NEG Cutoff:100 ng/mL   BUPRENORPHINE, URINE NEG Cutoff:10 ng/mL   Cannabinoid Scrn, Ur NEG Cutoff:50 ng/mL   Cocaine Metabolites NEG Cutoff:150 ng/mL   MDMA URINE NEG Cutoff:500 ng/mL   Methadone Screen, Urine NEG Cutoff:300 ng/mL   Oxycodone Screen, Ur PPS Cutoff:100 ng/mL   Tramadol Scrn, Ur NEG Cutoff:200 ng/mL   Propoxyphene NEG Cutoff:300 ng/mL   Tapentadol, urine NEG Cutoff:200 ng/mL   Opiate Screen, Urine PPS Cutoff:100 ng/mL   Zolpidem, Urine NEG Cutoff:20 ng/mL   Fentanyl, Ur NEG Cutoff:2 ng/mL   Meperidine, Ur NEG Cutoff:200 ng/mL   CARISOPRODOL, URINE NEG Cutoff:100 ng/mL   Prescribed Drug 1 NONE PROVIDED     Comment: * (PPS) Presumptive positive screen result to be verified by         quantitative LC/MS or GC/MS confirmation testing.  Opiates/Opioids (LC/MS-MS)     Status: Abnormal   Collection Time: 04/15/14 10:15 AM  Result Value Ref Range   Codeine Urine NEG <50 ng/mL   Hydrocodone NEG <50 ng/mL   Hydromorphone NEG <50 ng/mL   Morphine Urine NEG <50 ng/mL   Norhydrocodone, Ur NEG <50 ng/mL   Noroxycodone, Ur 511 (A) <50 ng/mL   Oxycodone, ur 8166 (A) <50 ng/mL   Oxymorphone 310 (A) <50 ng/mL  Oxycodone, Urine (LC/MS-MS)     Status: Abnormal   Collection Time: 04/15/14 10:15 AM  Result Value Ref Range   Noroxycodone, Ur 511 (A) <50 ng/mL   Oxycodone, ur 8166 (A) <50 ng/mL   Oxymorphone 310 (A) <50 ng/mL  Urinalysis     Status: None   Collection Time: 04/15/14 11:37 AM  Result Value Ref Range   Color, Urine YELLOW YELLOW   APPearance CLEAR CLEAR   Specific Gravity, Urine 1.014 1.005 - 1.030   pH 8.0 5.0 - 8.0   Glucose, UA NEG NEG mg/dL   Bilirubin Urine NEG NEG    Ketones, ur NEG NEG mg/dL   Hgb urine dipstick NEG NEG   Protein, ur NEG NEG mg/dL   Urobilinogen, UA 0.2 0.0 - 1.0 mg/dL   Nitrite NEG NEG   Leukocytes, UA NEG NEG  Lipid panel     Status: Abnormal   Collection Time: 04/15/14  2:47 PM  Result Value Ref Range   Cholesterol 131 0 - 200 mg/dL    Comment: ATP III Classification:       < 200        mg/dL        Desirable      200 - 239     mg/dL        Borderline High      >= 240        mg/dL        High     Triglycerides 170 (  H) <150 mg/dL   HDL 38 (L) >39 mg/dL   Total CHOL/HDL Ratio 3.4 Ratio   VLDL 34 0 - 40 mg/dL   LDL Cholesterol 59 0 - 99 mg/dL    Comment:   Total Cholesterol/HDL Ratio:CHD Risk                        Coronary Heart Disease Risk Table                                        Men       Women          1/2 Average Risk              3.4        3.3              Average Risk              5.0        4.4           2X Average Risk              9.6        7.1           3X Average Risk             23.4       11.0 Use the calculated Patient Ratio above and the CHD Risk table  to determine the patient's CHD Risk. ATP III Classification (LDL):       < 100        mg/dL         Optimal      100 - 129     mg/dL         Near or Above Optimal      130 - 159     mg/dL         Borderline High      160 - 189     mg/dL         High       > 190        mg/dL         Very High    Hemoglobin A1C     Status: None   Collection Time: 04/15/14  2:47 PM  Result Value Ref Range   Hgb A1c MFr Bld 5.6 <5.7 %    Comment:                                                                        According to the ADA Clinical Practice Recommendations for 2011, when HbA1c is used as a screening test:     >=6.5%   Diagnostic of Diabetes Mellitus            (if abnormal result is confirmed)   5.7-6.4%   Increased risk of developing Diabetes Mellitus   References:Diagnosis and Classification of Diabetes Mellitus,Diabetes EFEO,7121,97(JOITG  1):S62-S69 and Standards of Medical Care in         Diabetes - 2011,Diabetes PQDI,2641,58 (Suppl 1):S11-S61.     Mean  Plasma Glucose 114 <117 mg/dL  Comprehensive metabolic panel     Status: None   Collection Time: 04/15/14  2:47 PM  Result Value Ref Range   Sodium 136 135 - 145 mEq/L   Potassium 3.9 3.5 - 5.3 mEq/L   Chloride 102 96 - 112 mEq/L   CO2 27 19 - 32 mEq/L   Glucose, Bld 90 70 - 99 mg/dL   BUN 16 6 - 23 mg/dL   Creat 0.81 0.50 - 1.35 mg/dL   Total Bilirubin 0.6 0.2 - 1.2 mg/dL   Alkaline Phosphatase 66 39 - 117 U/L   AST 27 0 - 37 U/L   ALT 27 0 - 53 U/L   Total Protein 7.2 6.0 - 8.3 g/dL   Albumin 4.4 3.5 - 5.2 g/dL   Calcium 9.4 8.4 - 10.5 mg/dL  CBC with Differential     Status: Abnormal   Collection Time: 04/15/14  2:47 PM  Result Value Ref Range   WBC 4.7 4.0 - 10.5 K/uL   RBC 4.64 4.22 - 5.81 MIL/uL   Hemoglobin 12.3 (L) 13.0 - 17.0 g/dL   HCT 36.3 (L) 39.0 - 52.0 %   MCV 78.2 78.0 - 100.0 fL   MCH 26.5 26.0 - 34.0 pg   MCHC 33.9 30.0 - 36.0 g/dL   RDW 17.1 (H) 11.5 - 15.5 %   Platelets 327 150 - 400 K/uL   Neutrophils Relative % 58 43 - 77 %   Neutro Abs 2.7 1.7 - 7.7 K/uL   Lymphocytes Relative 32 12 - 46 %   Lymphs Abs 1.5 0.7 - 4.0 K/uL   Monocytes Relative 6 3 - 12 %   Monocytes Absolute 0.3 0.1 - 1.0 K/uL   Eosinophils Relative 4 0 - 5 %   Eosinophils Absolute 0.2 0.0 - 0.7 K/uL   Basophils Relative 0 0 - 1 %   Basophils Absolute 0.0 0.0 - 0.1 K/uL   Smear Review Criteria for review not met   Iron and TIBC     Status: None   Collection Time: 04/28/14  8:47 AM  Result Value Ref Range   Iron 76 42 - 165 ug/dL   UIBC 203 125 - 400 ug/dL   TIBC 279 215 - 435 ug/dL   %SAT 27 20 - 55 %  Testosterone, free, total     Status: Abnormal   Collection Time: 04/28/14  8:47 AM  Result Value Ref Range   Testosterone 233 (L) 300 - 890 ng/dL    Comment:           Tanner Stage       Male              Male               I              < 30 ng/dL        <  10 ng/dL               II             < 150 ng/dL       < 30 ng/dL               III            100-320 ng/dL     < 35 ng/dL               IV  200-970 ng/dL     15-40 ng/dL               V/Adult        300-890 ng/dL     10-70 ng/dL     Sex Hormone Binding 11 (L) 13 - 71 nmol/L   Testosterone, Free 72.2 47.0 - 244.0 pg/mL    Comment:   The concentration of free testosterone is derived from a mathematical expression based on constants for the binding of testosterone to sex hormone-binding globulin and albumin.   Testosterone-% Free 3.1 (H) 1.6 - 2.9 %  CBC with Differential     Status: Abnormal   Collection Time: 04/28/14  8:47 AM  Result Value Ref Range   WBC 4.4 4.0 - 10.5 K/uL   RBC 4.71 4.22 - 5.81 MIL/uL   Hemoglobin 12.3 (L) 13.0 - 17.0 g/dL   HCT 37.8 (L) 39.0 - 52.0 %   MCV 80.3 78.0 - 100.0 fL   MCH 26.1 26.0 - 34.0 pg   MCHC 32.5 30.0 - 36.0 g/dL   RDW 16.6 (H) 11.5 - 15.5 %   Platelets 228 150 - 400 K/uL   Neutrophils Relative % 53 43 - 77 %   Neutro Abs 2.3 1.7 - 7.7 K/uL   Lymphocytes Relative 32 12 - 46 %   Lymphs Abs 1.4 0.7 - 4.0 K/uL   Monocytes Relative 7 3 - 12 %   Monocytes Absolute 0.3 0.1 - 1.0 K/uL   Eosinophils Relative 7 (H) 0 - 5 %   Eosinophils Absolute 0.3 0.0 - 0.7 K/uL   Basophils Relative 1 0 - 1 %   Basophils Absolute 0.0 0.0 - 0.1 K/uL   Smear Review Criteria for review not met   Follicle stimulating hormone     Status: None   Collection Time: 04/28/14  8:47 AM  Result Value Ref Range   FSH 4.3 1.4 - 18.1 mIU/mL    Comment: Reference Ranges:          Male:                         1.4 -  18.1 mIU/mL          Male:   Follicular Phase    2.5 -  10.2 mIU/mL                    MidCycle Peak       3.4 -  33.4 mIU/mL                    Luteal Phase        1.5 -   9.1 mIU/mL                    Post Menopausal    23.0 - 116.3 mIU/mL                    Pregnant                <   0.3 mIU/mL  Luteinizing hormone     Status: None    Collection Time: 04/28/14  8:47 AM  Result Value Ref Range   LH 3.2 1.5 - 9.3 mIU/mL    Comment: Reference Ranges:          Male:     20 - 70 Years           1.5 -  9.3 mIU/mL                       > 70 Years           3.1 - 34.6 mIU/mL          Male:   Follicular Phase        1.9 - 12.5 mIU/mL                    Midcycle                8.7 - 76.3 mIU/mL                    Luteal Phase            0.5 - 16.9 mIU/mL                    Post Menopausal        15.9 - 54.0 mIU/mL                    Pregnant                    <  1.5 mIU/mL                    Contraceptives          0.7 -  5.6 mIU/mL          Children:                             <  6.0 mIU/mL    Prolactin     Status: None   Collection Time: 04/28/14  8:47 AM  Result Value Ref Range   Prolactin 4.7 2.1 - 17.1 ng/mL    Comment:      Reference Ranges:                  Male:                       2.1 -  17.1 ng/ml                  Male:   Pregnant          9.7 - 208.5 ng/mL                            Non Pregnant      2.8 -  29.2 ng/mL                            Post Menopausal   1.8 -  20.3 ng/mL                     Cortisol     Status: None   Collection Time: 04/28/14  8:47 AM  Result Value Ref Range   Cortisol, Plasma 8.4 ug/dL    Comment:   AM:  4.3 - 22.4 ug/dL PM:  3.1 - 16.7 ug/dL  PSA     Status: None   Collection Time: 04/28/14  8:47 AM  Result Value Ref Range   PSA 1.07 <=4.00 ng/mL    Comment: Test Methodology: ECLIA PSA (Electrochemiluminescence Immunoassay)   For PSA values from 2.5-4.0, particularly in younger men <  62 years old, the AUA and NCCN suggest testing for % Free PSA (3515) and evaluation of the rate of increase in PSA (PSA velocity).  hCG, quantitative, pregnancy     Status: None   Collection Time: 04/28/14  8:47 AM  Result Value Ref Range   hCG, Beta Chain, Quant, S <2.0 mIU/mL    Comment:    Males and non-pregnant females       < 5   mIU/mL  Gestation Age               Concentration  (mIU/mL)    <= 1 Week                       5 - 50       2 Weeks                     50 - 500       3 Weeks                    100 - 10,000       4 Weeks                  1,000 - 30,000       5 Weeks                  3,500 - 115,000     6-8 Weeks                 12,000 - 270,000      12 Weeks                 15,000 - 220,000     Past Psychiatric History/Hospitalization(s) Anxiety: Yes Bipolar Disorder: No Depression: Yes Mania: No Psychosis: No Schizophrenia: No Personality Disorder: No Hospitalization for psychiatric illness: No History of Electroconvulsive Shock Therapy: No Prior Suicide Attempts: No  Physical Exam: Constitutional:  BP 149/78 mmHg  Pulse 56  Ht 5' 6.75" (1.695 m)  Wt 234 lb 6.4 oz (106.323 kg)  BMI 37.01 kg/m2  Musculoskeletal: Strength & Muscle Tone: within normal limits Gait & Station: normal Patient leans: N/A  Mental Status Examination;  Patient is casually dressed and groomed.  He is anxious and maintained fair eye contact.  He described his mood as sad and anxious.  His affect is mood appropriate.He denies any auditory or visual hallucination.  He denies any active or passive suicidal thoughts or homicidal thoughts.  His attention and concentration is fair.  His psychomotor activity is normal.  There were no delusions, paranoia or obsession present at this time.  There were no tremors or shakes present at this time.  His fund of knowledge is average.  He is alert and oriented x3.  His insight judgment and impulse control is okay.   Established Problem, Stable/Improving (1), Review of Psycho-Social Stressors (1), Review or order clinical lab tests (1), Review and summation of old records (2), Review of Last Therapy Session (1) and Review of Medication Regimen & Side Effects (2)  Assessment: Axis I: Depressive disorder NOS, mood disorder due to general medical condition  Axis II: Deferred  Axis III: see medical history  Axis IV:  Moderate   Plan:  I review his recent blood work, collateral information and his current medication.  He is taking multiple pain medication however he is seeing regularly physician for pain management.  I encouraged him to keep  appointment with [name] for counseling and therapy.  I will continue Cymbalta 60 mg daily.  He does not have any side effects from Cymbalta.  Recommended to call us back if he has any question or any concern.  I will see him again in 3 months. Time spent 25 minutes.  More than 50% of the time spent in psychoeducation, counseling and coordination of care.  Discuss safety plan that anytime having active suicidal thoughts or homicidal thoughts then patient need to call 911 or go to the local emergency room.   Amberia Bayless T., MD 06/03/2014

## 2014-06-07 ENCOUNTER — Telehealth: Payer: Self-pay | Admitting: Internal Medicine

## 2014-06-07 DIAGNOSIS — G8929 Other chronic pain: Secondary | ICD-10-CM

## 2014-06-07 DIAGNOSIS — M549 Dorsalgia, unspecified: Principal | ICD-10-CM

## 2014-06-07 MED ORDER — OXYCODONE-ACETAMINOPHEN 5-325 MG PO TABS: 1.0000 | ORAL_TABLET | ORAL | Status: DC | PRN

## 2014-06-07 NOTE — Telephone Encounter (Signed)
Prescri[ption filled for Percocet 5-325 mg #90

## 2014-06-07 NOTE — Telephone Encounter (Signed)
Refill request for Percocet 5/325mg  LOV 04/15/2014. Please advise. Thanks!

## 2014-06-14 ENCOUNTER — Institutional Professional Consult (permissible substitution): Payer: Self-pay | Admitting: Cardiovascular Disease

## 2014-06-14 ENCOUNTER — Ambulatory Visit (HOSPITAL_COMMUNITY): Payer: Self-pay | Admitting: Psychiatry

## 2014-06-15 ENCOUNTER — Telehealth (HOSPITAL_COMMUNITY): Payer: Self-pay | Admitting: *Deleted

## 2014-06-15 NOTE — Telephone Encounter (Signed)
Patient left VM: Please tell Nicholas Mcintosh he is sorry for missing his appt. He forgot. Medication sent by Dr. Lolly MustacheArfeen last week never made it to pharmacy.  Contacted Nicholas Mcintosh OP pharmacy - per chart RX for Cymbalta received 11/5 - per pharmacy, medication is ready to pick up.  Contacted patient, informed him pharmacy states ready. He states he was at pharmacy yesterday and med was not ready. Pt states he will call pharmacy.

## 2014-06-21 ENCOUNTER — Telehealth: Payer: Self-pay | Admitting: Internal Medicine

## 2014-06-21 DIAGNOSIS — G8929 Other chronic pain: Secondary | ICD-10-CM

## 2014-06-21 DIAGNOSIS — M549 Dorsalgia, unspecified: Principal | ICD-10-CM

## 2014-06-21 NOTE — Telephone Encounter (Signed)
Refill request for oxycodone 5/325mg . LOV 04/15/2014. Please advise. Thanks!

## 2014-06-22 ENCOUNTER — Other Ambulatory Visit: Payer: Self-pay | Admitting: Internal Medicine

## 2014-06-22 MED ORDER — OXYCODONE-ACETAMINOPHEN 5-325 MG PO TABS: 1.0000 | ORAL_TABLET | ORAL | Status: DC | PRN

## 2014-06-22 NOTE — Addendum Note (Signed)
Addended by: Marthann SchillerMATTHEWS, MICHELLE A on: 06/22/2014 03:04 PM   Modules accepted: Orders

## 2014-06-22 NOTE — Telephone Encounter (Signed)
Prescription re-written for Oxycodone 5-325 mg #90

## 2014-07-01 ENCOUNTER — Telehealth: Payer: Self-pay | Admitting: Internal Medicine

## 2014-07-01 DIAGNOSIS — M549 Dorsalgia, unspecified: Principal | ICD-10-CM

## 2014-07-01 DIAGNOSIS — G8929 Other chronic pain: Secondary | ICD-10-CM

## 2014-07-01 NOTE — Telephone Encounter (Signed)
Refill request for percocet 5/325mg . LOV 04/15/2014. Please advise. Thanks!

## 2014-07-02 MED ORDER — OXYCODONE-ACETAMINOPHEN 5-325 MG PO TABS: 1.0000 | ORAL_TABLET | ORAL | Status: DC | PRN

## 2014-07-02 NOTE — Addendum Note (Signed)
Addended by: Marthann SchillerMATTHEWS, Dawnna Gritz A on: 07/02/2014 05:03 PM   Modules accepted: Orders

## 2014-07-02 NOTE — Telephone Encounter (Signed)
Prescription re-written for Percocet 5-325 mg #90.

## 2014-07-04 ENCOUNTER — Encounter (HOSPITAL_COMMUNITY): Payer: Self-pay | Admitting: Emergency Medicine

## 2014-07-04 ENCOUNTER — Emergency Department (HOSPITAL_COMMUNITY)
Admission: EM | Admit: 2014-07-04 | Discharge: 2014-07-04 | Disposition: A | Discharge status: Home / Self Care | Payer: 59 | Source: Home / Self Care | Attending: Emergency Medicine | Admitting: Emergency Medicine

## 2014-07-04 DIAGNOSIS — J45901 Unspecified asthma with (acute) exacerbation: Secondary | ICD-10-CM

## 2014-07-04 MED ORDER — ALBUTEROL SULFATE (2.5 MG/3ML) 0.083% IN NEBU
INHALATION_SOLUTION | RESPIRATORY_TRACT | Status: AC
  Filled 2014-07-04: qty 3

## 2014-07-04 MED ORDER — BECLOMETHASONE DIPROPIONATE 80 MCG/ACT IN AERS: 2.0000 | INHALATION_SPRAY | Freq: Two times a day (BID) | RESPIRATORY_TRACT | Status: DC

## 2014-07-04 MED ORDER — METHYLPREDNISOLONE SODIUM SUCC 125 MG IJ SOLR
125.0000 mg | Freq: Once | INTRAMUSCULAR | Status: AC
  Administered 2014-07-04: 125 mg via INTRAMUSCULAR

## 2014-07-04 MED ORDER — EPINEPHRINE HCL 1 MG/ML IJ SOLN
INTRAMUSCULAR | Status: AC
  Filled 2014-07-04: qty 1

## 2014-07-04 MED ORDER — ALBUTEROL SULFATE (2.5 MG/3ML) 0.083% IN NEBU
2.5000 mg | INHALATION_SOLUTION | Freq: Once | RESPIRATORY_TRACT | Status: AC
  Administered 2014-07-04: 2.5 mg via RESPIRATORY_TRACT

## 2014-07-04 MED ORDER — EPINEPHRINE HCL 1 MG/ML IJ SOLN
0.3000 mg | Freq: Once | INTRAMUSCULAR | Status: AC
  Administered 2014-07-04: 0.3 mg via INTRAMUSCULAR

## 2014-07-04 MED ORDER — METHYLPREDNISOLONE SODIUM SUCC 125 MG IJ SOLR
INTRAMUSCULAR | Status: AC
  Filled 2014-07-04: qty 2

## 2014-07-04 MED ORDER — IPRATROPIUM-ALBUTEROL 0.5-2.5 (3) MG/3ML IN SOLN
RESPIRATORY_TRACT | Status: AC
  Filled 2014-07-04: qty 3

## 2014-07-04 MED ORDER — IPRATROPIUM-ALBUTEROL 0.5-2.5 (3) MG/3ML IN SOLN
3.0000 mL | Freq: Once | RESPIRATORY_TRACT | Status: AC
  Administered 2014-07-04: 3 mL via RESPIRATORY_TRACT

## 2014-07-04 MED ORDER — PREDNISONE 20 MG PO TABS: ORAL_TABLET | ORAL | Status: DC

## 2014-07-04 NOTE — Discharge Instructions (Signed)

## 2014-07-04 NOTE — ED Provider Notes (Signed)
Chief Complaint   Asthma   History of Present Illness   Linton Hamntonio Tomkins is a 48 year old male with a history of asthma since he was a child. This is usually maintained with as needed use of Xopenex by nebulizer. He's had wheezing, chest tightness, shortness of breath, and cough since this morning. He has no idea what triggered this off. He denies any exposure to any allergens or antigens. He's had no fever, nasal congestion, rhinorrhea, sore throat, or URI symptoms. He's tried 3 nebulizer treatments without relief. He does not use a controller inhaler. He does not see a lung specialist.  Review of Systems   Other than as noted above, the patient denies any of the following symptoms. Systemic:  No fever, chills, or sweats. ENT:  No nasal congestion, sneezing, rhinorrhea, or sore throat. Lungs:  No cough, sputum production, or shortness of breath. No chest pain. Skin:  No rash or itching.  PMFSH   Past medical history, family history, social history, meds, and allergies were reviewed.   Physical Examination    Vital signs:  BP 175/90 mmHg  Pulse 78  Temp(Src) 98.9 F (37.2 C) (Oral)  Resp 26  SpO2 95% General:  Alert, in moderate respiratory distress. He is only able to speak in short phrases in a whisper. He seems to have significant respiratory distress. No retractions or use of accessory muscles.  Eye:  No conjunctival injection or drainage. Lids were normal. ENT:  TMs and canals were normal, without erythema or inflammation.  Nasal mucosa was clear and uncongested, without drainage.  Mucous membranes were moist.  Pharynx was clear, without exudate or drainage.  There were no oral ulcerations or lesions. Neck:  Supple, no adenopathy, tenderness or mass. Lungs:  No retractions or use of accessory muscles.  Lungs are quiet with diminished air movement, no wheezes, rales, rhonchi. Heart:  Regular rhythm, without gallops, murmers or rubs. Skin:  Clear, warm, and dry, without rash or  lesions.  Course in [facility]   The following medications were given:  Medications  methylPREDNISolone sodium succinate (SOLU-MEDROL) 125 mg/2 mL injection 125 mg (125 mg Intramuscular Given 07/04/14 1819)  albuterol (PROVENTIL) (2.5 MG/3ML) 0.083% nebulizer solution 2.5 mg (2.5 mg Nebulization Given 07/04/14 1819)  ipratropium-albuterol (DUONEB) 0.5-2.5 (3) MG/3ML nebulizer solution 3 mL (3 mLs Nebulization Given 07/04/14 1820)  EPINEPHrine (ADRENALIN) injection 0.3 mg (0.3 mg Intramuscular Given 07/04/14 1820)  albuterol (PROVENTIL) (2.5 MG/3ML) 0.083% nebulizer solution 2.5 mg (2.5 mg Nebulization Given 07/04/14 1847)    After the first breathing treatment he was markedly better. His lungs were clear with good air movement and free of any wheezes. He elected to take a second treatment, because he still felt a little bit tight. After that he felt a lot better and felt he could go home. He was able to speak in long sentences.    Assessment   The encounter diagnosis was Asthma attack.  Plan    1.  Meds:  The following meds were prescribed:   Discharge Medication List as of 07/04/2014  7:16 PM    START taking these medications   Details  beclomethasone (QVAR) 80 MCG/ACT inhaler Inhale 2 puffs into the lungs 2 (two) times daily., Starting 07/04/2014, Until Discontinued, Normal    predniSONE (DELTASONE) 20 MG tablet Take 3 daily for 5 days, 2 daily for 5 days, 1 daily for 5 days., Normal        2.  Patient Education/Counseling:  The patient was given  appropriate handouts, self care instructions, and instructed in symptomatic relief.    3.  Follow up:  The patient was told to follow up here if no better in 2 days, or sooner if becoming worse in any way, and given some red flag symptoms such as increasing difficulty breathing which would prompt immediate return.  Follow-up with a pulmonary specialist as soon as possible.       Reuben Likesavid C Rosely Fernandez, MD 07/04/14 2004

## 2014-07-08 ENCOUNTER — Other Ambulatory Visit: Payer: Self-pay | Admitting: Internal Medicine

## 2014-07-08 DIAGNOSIS — R7989 Other specified abnormal findings of blood chemistry: Secondary | ICD-10-CM

## 2014-07-08 LAB — ESTRADIOL, FREE
Estradiol, Free: 0.52
Estradiol: 18

## 2014-07-09 ENCOUNTER — Encounter: Payer: Self-pay | Admitting: *Deleted

## 2014-07-12 ENCOUNTER — Ambulatory Visit (INDEPENDENT_AMBULATORY_CARE_PROVIDER_SITE_OTHER): Payer: 59 | Admitting: Psychiatry

## 2014-07-12 DIAGNOSIS — F329 Major depressive disorder, single episode, unspecified: Secondary | ICD-10-CM

## 2014-07-12 DIAGNOSIS — F32A Depression, unspecified: Secondary | ICD-10-CM

## 2014-07-12 DIAGNOSIS — F411 Generalized anxiety disorder: Secondary | ICD-10-CM

## 2014-07-13 NOTE — Progress Notes (Signed)
   THERAPIST PROGRESS NOTE  Session Time: 1:00-2:00   Participation Level: Active   Behavioral Response: CasualAlertEuthymic   Type of Therapy: Individual Therapy   Treatment Goals addressed: emotion regulation   Interventions: Strength-based and Supportive   Summary: Nicholas Mcintosh is a 48 y.o. male who presents with depression.   Suicidal/Homicidal: Nowithout intent/plan   Therapist Observations/Response: Pt. Continues to present with positive mood. Pt. 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 Pt. Reported feeling encouraged and validated by the [employer]'s acknowledgement of his injury. Pt. Continues to be focused on providing positive learning environment for his son through school and involvement in sports. Pt. Continues to grieve the loss of his best friend. Most of session spent focused on processing loss of his friend and development of plan for self-care. Pt. Acknowledged need for leisure activities and travel.  Plan: Pt. To continue with CBT focused treatment. Return again in 2-4 weeks.   Diagnosis: Axis I: Depressive Disorder NOS   Axis II: No diagnosis    Nicholas Mcintosh, Nicholas Mcintosh, [facility] 07/13/2014

## 2014-07-15 ENCOUNTER — Telehealth: Payer: Self-pay | Admitting: Internal Medicine

## 2014-07-15 DIAGNOSIS — G8929 Other chronic pain: Secondary | ICD-10-CM

## 2014-07-15 DIAGNOSIS — M549 Dorsalgia, unspecified: Principal | ICD-10-CM

## 2014-07-15 MED ORDER — OXYCODONE-ACETAMINOPHEN 5-325 MG PO TABS: 1.0000 | ORAL_TABLET | ORAL | Status: DC | PRN

## 2014-07-15 NOTE — Telephone Encounter (Signed)
Refill request for percocet 5/325mg. LOV 04/15/2014. Please advise. Thanks!  

## 2014-07-15 NOTE — Telephone Encounter (Signed)
Prescription re-written for Percocet 5-325 mg #90 tabs.

## 2014-07-19 ENCOUNTER — Other Ambulatory Visit: Payer: Self-pay | Admitting: *Deleted

## 2014-07-20 ENCOUNTER — Other Ambulatory Visit (INDEPENDENT_AMBULATORY_CARE_PROVIDER_SITE_OTHER): Payer: 59 | Admitting: *Deleted

## 2014-07-20 ENCOUNTER — Other Ambulatory Visit (INDEPENDENT_AMBULATORY_CARE_PROVIDER_SITE_OTHER): Payer: 59

## 2014-07-20 DIAGNOSIS — R7989 Other specified abnormal findings of blood chemistry: Secondary | ICD-10-CM

## 2014-07-20 LAB — CORTISOL
CORTISOL PLASMA: 8.1 ug/dL
Cortisol, Plasma: 17.2 ug/dL
Cortisol, Plasma: 22.8 ug/dL

## 2014-07-20 MED ORDER — COSYNTROPIN 0.25 MG IJ SOLR
0.2500 mg | Freq: Once | INTRAMUSCULAR | Status: AC
  Administered 2014-07-20: 0.25 mg via INTRAMUSCULAR

## 2014-07-20 NOTE — Addendum Note (Signed)
Addended by: Nehemiah MassedAGBO, Tyriek Hofman D on: 07/20/2014 10:13 AM   Modules accepted: Orders

## 2014-07-20 NOTE — Addendum Note (Signed)
Addended by: Nehemiah MassedAGBO, Clemente Dewey D on: 07/20/2014 09:36 AM   Modules accepted: Orders

## 2014-07-21 ENCOUNTER — Other Ambulatory Visit: Payer: Self-pay | Admitting: Internal Medicine

## 2014-07-21 ENCOUNTER — Other Ambulatory Visit: Payer: Self-pay

## 2014-07-22 LAB — ACTH: C206 ACTH: 44 pg/mL (ref 6–50)

## 2014-07-28 ENCOUNTER — Telehealth: Payer: Self-pay | Admitting: Internal Medicine

## 2014-07-29 NOTE — Telephone Encounter (Signed)
Reviewed the [facility] reporting system, which shows that previous Rx was written on 07/15/2014 and filled on 07/26/2014. Refill request for Percocet 5-325 mg every 4 hours prn #90 is inappropriate.    Massie MaroonHollis,Lachina M, FNP

## 2014-07-29 NOTE — Telephone Encounter (Signed)
Refill request for oxycodone 5/325mg . Please advise. Thanks! 04/15/2014

## 2014-08-03 ENCOUNTER — Ambulatory Visit: Payer: Self-pay | Admitting: Internal Medicine

## 2014-08-06 ENCOUNTER — Telehealth: Payer: Self-pay | Admitting: Internal Medicine

## 2014-08-06 DIAGNOSIS — G8929 Other chronic pain: Secondary | ICD-10-CM

## 2014-08-06 DIAGNOSIS — M549 Dorsalgia, unspecified: Principal | ICD-10-CM

## 2014-08-06 MED ORDER — OXYCODONE-ACETAMINOPHEN 5-325 MG PO TABS: 1.0000 | ORAL_TABLET | ORAL | Status: DC | PRN

## 2014-08-06 NOTE — Telephone Encounter (Signed)
Prescription re-written for Percocet 5-325 mg #90. [place] and no inconsistencies noted.

## 2014-08-06 NOTE — Telephone Encounter (Signed)
Refill request for percocet 5/325mg. LOV 04/15/2014. Please advise. Thanks!  

## 2014-08-12 ENCOUNTER — Ambulatory Visit (INDEPENDENT_AMBULATORY_CARE_PROVIDER_SITE_OTHER): Payer: 59 | Admitting: Internal Medicine

## 2014-08-12 VITALS — BP 147/83 | HR 68 | Temp 98.0°F | Resp 16 | Ht 67.0 in | Wt 235.0 lb

## 2014-08-12 DIAGNOSIS — G894 Chronic pain syndrome: Secondary | ICD-10-CM

## 2014-08-12 DIAGNOSIS — E559 Vitamin D deficiency, unspecified: Secondary | ICD-10-CM

## 2014-08-12 DIAGNOSIS — E669 Obesity, unspecified: Secondary | ICD-10-CM

## 2014-08-12 DIAGNOSIS — M62830 Muscle spasm of back: Secondary | ICD-10-CM

## 2014-08-12 DIAGNOSIS — I10 Essential (primary) hypertension: Secondary | ICD-10-CM

## 2014-08-12 DIAGNOSIS — E8881 Metabolic syndrome: Secondary | ICD-10-CM

## 2014-08-12 LAB — BASIC METABOLIC PANEL
BUN: 12 mg/dL (ref 6–23)
CALCIUM: 9.1 mg/dL (ref 8.4–10.5)
CO2: 26 meq/L (ref 19–32)
Chloride: 105 mEq/L (ref 96–112)
Creat: 0.91 mg/dL (ref 0.50–1.35)
GLUCOSE: 99 mg/dL (ref 70–99)
Potassium: 4.1 mEq/L (ref 3.5–5.3)
Sodium: 139 mEq/L (ref 135–145)

## 2014-08-12 MED ORDER — AMLODIPINE BESYLATE-VALSARTAN 5-320 MG PO TABS: 1.0000 | ORAL_TABLET | Freq: Every day | ORAL | Status: DC

## 2014-08-12 MED ORDER — CARISOPRODOL 350 MG PO TABS: 350.0000 mg | ORAL_TABLET | Freq: Two times a day (BID) | ORAL | Status: DC

## 2014-08-12 NOTE — Progress Notes (Signed)
Patient ID: Nicholas Mcintosh, male   DOB: [birth year], 49 y.o.   MRN: [id number]  HPI 49 y.o. male  presents for 3 month follow up with hypertension, hyperlipidemia and prediabetes. He is also has chronic pain and is on multiple medications for pain management including Percocet, Soma and Mobic. We have discussed multiple times in the past the need for referral to a pain specialist however this has been deferred as the patient was dealing with several IADL issues that were major stessers. However they have been resolved and we are now discussing referral to pain specialist.  His blood pressure has not been controlled at home, today their BP is BP: (!) 147/83 mmHg He does not workout because of the chronic back pain. He denies chest pain, shortness of breath, dizziness.  He is not on cholesterol medication and denies myalgias. His cholesterol is at goal, however he does take fish oil occasionally and is not interested in starting any statins. . The cholesterol last visit was:   Lab Results  Component Value Date   CHOL 131 04/15/2014   HDL 38* 04/15/2014   LDLCALC 59 04/15/2014   TRIG 170* 04/15/2014   CHOLHDL 3.4 04/15/2014   He has not been working on diet and exercise for prediabetes Last A1C in the office was:  Lab Results  Component Value Date   HGBA1C 5.6 04/15/2014   Patient is on Vitamin D supplement.   Lab Results  Component Value Date   VD25OH 33 01/11/2014       Current Medications:  Current Outpatient Prescriptions on File Prior to Visit  Medication Sig Dispense Refill  . albuterol (PROVENTIL HFA;VENTOLIN HFA) 108 (90 BASE) MCG/ACT inhaler Inhale 2 puffs into the lungs every 4 (four) hours as needed for wheezing or shortness of breath. 1 Inhaler 3  . DULoxetine (CYMBALTA) 60 MG capsule Take 1 capsule (60 mg total) by mouth daily. 90 capsule 0  . levothyroxine (SYNTHROID, LEVOTHROID) 50 MCG tablet Take 1 tablet (50 mcg total) by mouth daily. 90 tablet 3  . meloxicam (MOBIC) 15  MG tablet Take 1 tablet (15 mg total) by mouth at bedtime. 90 tablet 3  . oxyCODONE-acetaminophen (PERCOCET/ROXICET) 5-325 MG per tablet Take 1 tablet by mouth every 4 (four) hours as needed. 90 tablet 0  . sildenafil (VIAGRA) 100 MG tablet Take 1 tablet (100 mg total) by mouth daily as needed for erectile dysfunction. 10 tablet 0   No current facility-administered medications on file prior to visit.   Medical History:  Past Medical History  Diagnosis Date  . Asthma   . Hypertension   . Hypothyroidism   . New-onset angina 07/12/2011  . Back pain     Secondary to 2-story fall 2006   Allergies: No Known Allergies   Review of Systems:  Review of Systems  Constitutional: Negative.   HENT: Negative.   Eyes: Negative.   Respiratory: Negative.   Cardiovascular: Negative.   Gastrointestinal: Negative.   Genitourinary: Negative.   Musculoskeletal: Positive for myalgias.  Skin: Negative.   Neurological: Negative.   Endo/Heme/Allergies: Negative.   Psychiatric/Behavioral: Negative.      Family history- Review and unchanged Social history- Review and unchanged Physical Exam: BP 147/83 mmHg  Pulse 68  Temp(Src) 98 F (36.7 C) (Oral)  Resp 16  Ht 5\' 7"  (1.702 m)  Wt 235 lb (106.595 kg)  BMI 36.80 kg/m2 Wt Readings from Last 3 Encounters:  08/12/14 235 lb (106.595 kg)  06/03/14 234 lb 6.4 oz (106.323 kg)  04/15/14 235 lb (106.595 kg)   General Appearance: Well nourished, in no apparent distress. Eyes: PERRLA, EOMs, conjunctiva no swelling or erythema Sinuses: No Frontal/maxillary tenderness ENT/Mouth: Ext aud canals clear, TMs without erythema, bulging. No erythema, swelling, or exudate on post pharynx.  Tonsils not swollen or erythematous. Hearing normal.  Neck: Supple, thyroid normal.  Respiratory: Respiratory effort normal, BS equal bilaterally without rales, rhonchi, wheezing or stridor.  Cardio: RRR with no MRGs. Brisk peripheral pulses without edema.  Abdomen: Soft, +  BS.  Non tender, no guarding, rebound, hernias, masses. Lymphatics: Non tender without lymphadenopathy.  Musculoskeletal: Full ROM, normal strength, normal gait.  Skin: Warm, dry without rashes, lesions, ecchymosis.  Neuro: Cranial nerves intact. Normal muscle tone, no cerebellar symptoms. Sensation intact.  Psych: Awake and oriented X 3, normal affect, Insight and Judgment appropriate.   Assessment and Plan:  1. Essential hypertension - Hypertension: increase medication from Exforge 5-160 mg to 5-320 mg , monitor blood pressure at home. Continue DASH diet.  Reminder to go to the ER if any CP, SOB, nausea, dizziness, severe HA, changes vision/speech, left arm numbness and tingling, and jaw pain.  - amLODipine-valsartan (EXFORGE) 5-320 MG per tablet; Take 1 tablet by mouth daily.  Dispense: 30 tablet; Refill: 2 - Basic Metabolic Panel - Urinalysis  2. .Vitamin D deficiency - continue medications.  3. Metabolic syndrome/ Pre-diabetes -Continue diet and exercise. Check A1C in 3 months - Hemoglobin A1C; Future  4. Elevated Triglycerides - Continue Fish Oil - Continue diet and exercise. Check cholesterol.   5.Obesity - I have discussed diet and exercise with patient. He cites his pain as the barrier to exercising. I have suggested water exercising. Pt will give this some thought  6. Chronic pain syndrome - Will continue Percocet, Soma, Percocet and Mobic for now. - Ambulatory referral to Pain Clinic  7. Muscle spasm of back - Ambulatory referral to Pain Clinic - Instructed in stretching exercises. - carisoprodol (SOMA) 350 MG tablet; Take 1 tablet (350 mg total) by mouth 2 (two) times daily.  Dispense: 60 tablet; Refill: 2   Continue diet and meds as discussed. Further disposition pending results of labs.  Nicholas Mcintosh A., MD 10:05 AM [facility]

## 2014-08-13 LAB — URINALYSIS
BILIRUBIN URINE: NEGATIVE
GLUCOSE, UA: NEGATIVE mg/dL
KETONES UR: NEGATIVE mg/dL
Leukocytes, UA: NEGATIVE
Nitrite: NEGATIVE
PROTEIN: NEGATIVE mg/dL
SPECIFIC GRAVITY, URINE: 1.016 (ref 1.005–1.030)
Urobilinogen, UA: 0.2 mg/dL (ref 0.0–1.0)
pH: 7.5 (ref 5.0–8.0)

## 2014-08-19 ENCOUNTER — Telehealth: Payer: Self-pay | Admitting: Internal Medicine

## 2014-08-19 DIAGNOSIS — M549 Dorsalgia, unspecified: Principal | ICD-10-CM

## 2014-08-19 DIAGNOSIS — G8929 Other chronic pain: Secondary | ICD-10-CM

## 2014-08-19 MED ORDER — OXYCODONE-ACETAMINOPHEN 5-325 MG PO TABS: 1.0000 | ORAL_TABLET | ORAL | Status: DC | PRN

## 2014-08-19 NOTE — Telephone Encounter (Signed)
Prescription re-written for Percocet 5-325 mg #90. [place] and no inconsistencies noted. 

## 2014-08-19 NOTE — Telephone Encounter (Signed)
Refill request for percocet 5/325mg . LOV 08/12/2014. Please advise. Thanks!

## 2014-09-02 ENCOUNTER — Telehealth: Payer: Self-pay | Admitting: Internal Medicine

## 2014-09-02 DIAGNOSIS — M549 Dorsalgia, unspecified: Principal | ICD-10-CM

## 2014-09-02 DIAGNOSIS — G8929 Other chronic pain: Secondary | ICD-10-CM

## 2014-09-02 MED ORDER — OXYCODONE-ACETAMINOPHEN 5-325 MG PO TABS: 1.0000 | ORAL_TABLET | ORAL | Status: DC | PRN

## 2014-09-02 NOTE — Telephone Encounter (Signed)
Prescription written for Oxycodone-APAP 5-325 mg #90. Pt was referred to pain clinic will need to follow up on when he has an appointment.

## 2014-09-02 NOTE — Telephone Encounter (Signed)
Refill request for Percocet 5/325mg . LOV 08/12/2014. Please advise. Thanks!

## 2014-09-03 ENCOUNTER — Telehealth: Payer: Self-pay | Admitting: Internal Medicine

## 2014-09-03 NOTE — Telephone Encounter (Signed)
PT was given an appointment for [facility] but it was cost prohibitive with a co-pay upwards of $200 every 30 days. Pt is unable to afford this co-pay. He will explore other avenues for accessing a pain specialist. Meanwhile he has been stable on his current dose of opiates for more than a year under my care and  For at least 3 years before changing his care to this practice. He has had no abberent activity, or prescribing inconsistencies or misuse. He has not had any adverse interactions with the Tresa GarterSoma that he take for muscle spasms. I will continue to prescribe his opiate medications until he can access pain management.

## 2014-09-06 ENCOUNTER — Ambulatory Visit (HOSPITAL_COMMUNITY): Payer: Self-pay | Admitting: Psychiatry

## 2014-09-16 ENCOUNTER — Telehealth: Payer: Self-pay | Admitting: Internal Medicine

## 2014-09-16 ENCOUNTER — Ambulatory Visit (HOSPITAL_COMMUNITY): Payer: Self-pay | Admitting: Psychiatry

## 2014-09-16 DIAGNOSIS — M549 Dorsalgia, unspecified: Principal | ICD-10-CM

## 2014-09-16 DIAGNOSIS — G8929 Other chronic pain: Secondary | ICD-10-CM

## 2014-09-16 NOTE — Telephone Encounter (Signed)
Refill request for percocet 5/325mg . LOV 08/12/2014. Please advise. Thanks!

## 2014-09-20 MED ORDER — OXYCODONE-ACETAMINOPHEN 5-325 MG PO TABS: 1.0000 | ORAL_TABLET | ORAL | Status: DC | PRN

## 2014-09-20 NOTE — Telephone Encounter (Signed)
Prescription written for Oxycodone-APAP 5-325 mg #90 tabs. [place] reviewed and no inconsistencies noted.

## 2014-09-30 ENCOUNTER — Ambulatory Visit: Payer: Self-pay | Admitting: Internal Medicine

## 2014-10-01 ENCOUNTER — Telehealth: Payer: Self-pay | Admitting: Internal Medicine

## 2014-10-01 DIAGNOSIS — M549 Dorsalgia, unspecified: Principal | ICD-10-CM

## 2014-10-01 DIAGNOSIS — G8929 Other chronic pain: Secondary | ICD-10-CM

## 2014-10-01 MED ORDER — OXYCODONE-ACETAMINOPHEN 5-325 MG PO TABS: 1.0000 | ORAL_TABLET | ORAL | Status: DC | PRN

## 2014-10-01 NOTE — Telephone Encounter (Signed)
Prescription written for Percocet 5-325 mg #90 tabs. [place] reviewed and no inconsistencies noted.

## 2014-10-01 NOTE — Telephone Encounter (Signed)
Refill request for percocet 5/325mg. LOV 08/12/2014. Please advise. Thanks!  

## 2014-10-06 ENCOUNTER — Ambulatory Visit (INDEPENDENT_AMBULATORY_CARE_PROVIDER_SITE_OTHER): Payer: 59 | Admitting: Psychiatry

## 2014-10-06 DIAGNOSIS — F411 Generalized anxiety disorder: Secondary | ICD-10-CM

## 2014-10-06 DIAGNOSIS — F32A Depression, unspecified: Secondary | ICD-10-CM

## 2014-10-06 DIAGNOSIS — F329 Major depressive disorder, single episode, unspecified: Secondary | ICD-10-CM

## 2014-10-08 NOTE — Progress Notes (Signed)
   THERAPIST PROGRESS NOTE  Session Time: 1:30-2:30  Participation Level: Active   Behavioral Response: CasualAlertEuthymic   Type of Therapy: Individual Therapy   Treatment Goals addressed: emotion regulation   Interventions: Strength-based and Supportive   Summary: Linton Hamntonio Harper is a 49 y.o. male who presents with depression.   Suicidal/Homicidal: Nowithout intent/plan   Therapist Observations/Response: Pt. Continues to present with positive mood. Pt. Reports relationship concerns with his wife which he attributes to her needs for things to be "perfect". Pt. Was encouraged to discuss marriage counseling with his wife, but believes that she would not consent to entering therapy with him. Pt. Reports that his primary way of coping has been to focus on his son's academics and sports activities which are positive. Pt was encouraged to focus on other leisure activities that he enjoys such as cooking and travel. Pt. Reported that he has been able to engage in physical exercise which helps his mood although it is frequently painful for him to do so.   Plan: Pt. To continue with CBT focused treatment. Return again in 2-4 weeks.   Diagnosis: Axis I: Depressive Disorder NOS   Axis II: No diagnosis     Shaune PollackBrown, Icela Glymph B, [facility] 10/08/2014

## 2014-10-14 ENCOUNTER — Telehealth: Payer: Self-pay | Admitting: Internal Medicine

## 2014-10-14 DIAGNOSIS — G8929 Other chronic pain: Secondary | ICD-10-CM

## 2014-10-14 DIAGNOSIS — M549 Dorsalgia, unspecified: Principal | ICD-10-CM

## 2014-10-14 MED ORDER — OXYCODONE-ACETAMINOPHEN 5-325 MG PO TABS: 1.0000 | ORAL_TABLET | ORAL | Status: DC | PRN

## 2014-10-14 NOTE — Telephone Encounter (Signed)
Prescription written for Percocet 5-325 mg #90 tabs. [place] reviewed and no inconsistencies noted.

## 2014-10-14 NOTE — Telephone Encounter (Signed)
Refill request for oxycodone 5/325mg . LOV 08/12/2014. Please advise. Thanks!

## 2014-10-19 ENCOUNTER — Other Ambulatory Visit: Payer: Self-pay | Admitting: Internal Medicine

## 2014-10-25 ENCOUNTER — Ambulatory Visit (HOSPITAL_COMMUNITY): Payer: Self-pay | Admitting: Psychiatry

## 2014-10-27 ENCOUNTER — Ambulatory Visit (HOSPITAL_COMMUNITY): Payer: Self-pay | Admitting: Psychiatry

## 2014-10-27 ENCOUNTER — Telehealth (HOSPITAL_COMMUNITY): Payer: Self-pay | Admitting: *Deleted

## 2014-10-27 NOTE — Telephone Encounter (Signed)
Dr. Lolly MustacheArfeen would like patient to wait for appointment on Monday.  LMOM for patient to call office back.

## 2014-10-27 NOTE — Telephone Encounter (Signed)
Dr. Lolly MustacheArfeen,   Patient had an appointment with you today and had to cancel, mother had an emergency, needed son. Patient was last seen with you 06-03-2014. Patient cancelled follow up appointment 09-06-14. Patient had Cymbalta RX filled on 06/16/14 for a 90 day supply no refills. Patient states as of 2 weeks ago he has been out of medication.  [facility] time medication was filled was 06/16/14.  Patient stated he has had medication up until 2 weeks ago.  Patient made an appointment with you on Monday.  Do you want patient to wait until appointment on Monday or refill medication today?  Thank you

## 2014-10-29 ENCOUNTER — Telehealth: Payer: Self-pay | Admitting: Internal Medicine

## 2014-10-29 DIAGNOSIS — G8929 Other chronic pain: Secondary | ICD-10-CM

## 2014-10-29 DIAGNOSIS — M549 Dorsalgia, unspecified: Principal | ICD-10-CM

## 2014-10-29 NOTE — Telephone Encounter (Signed)
Refill request for percocet 5/325mg . LOV 08/12/2014. Please advise. Thanks!

## 2014-10-30 MED ORDER — OXYCODONE-ACETAMINOPHEN 5-325 MG PO TABS: 1.0000 | ORAL_TABLET | ORAL | Status: DC | PRN

## 2014-10-30 NOTE — Telephone Encounter (Signed)
Prescription written for Oxycodone/APAP 5-325 mg#90tabs. [place] reviewed and no inconsistencies noted.

## 2014-11-01 ENCOUNTER — Encounter (HOSPITAL_COMMUNITY): Payer: Self-pay | Admitting: Psychiatry

## 2014-11-01 ENCOUNTER — Ambulatory Visit (INDEPENDENT_AMBULATORY_CARE_PROVIDER_SITE_OTHER): Payer: 59 | Admitting: Psychiatry

## 2014-11-01 VITALS — BP 136/82 | HR 75 | Ht 66.5 in | Wt 234.0 lb

## 2014-11-01 DIAGNOSIS — F39 Unspecified mood [affective] disorder: Secondary | ICD-10-CM

## 2014-11-01 DIAGNOSIS — F32A Depression, unspecified: Secondary | ICD-10-CM

## 2014-11-01 DIAGNOSIS — F329 Major depressive disorder, single episode, unspecified: Secondary | ICD-10-CM

## 2014-11-01 MED ORDER — DULOXETINE HCL 60 MG PO CPEP: 60.0000 mg | ORAL_CAPSULE | Freq: Every day | ORAL | Status: DC

## 2014-11-01 NOTE — Progress Notes (Signed)
[facility] [id number] Progress Note  Nicholas Mcintosh [id number] 49 y.o.  11/01/2014 10:43 AM  Chief Complaint:  Medication management and follow-up.      History of Present Illness:  Nicholas Mcintosh came for his followup appointment.  He was last seen in November and since then he missed appointment.  He is noncompliant with Cymbalta for 2 weeks and he's been experiencing increased anxiety, nervousness, poor sleep and irritability.  He wants to go back on Cymbalta.  He denies any hallucination or any suicidal thoughts but admitted some time get frustrated.  He wants to continue therapy with Nicholas Mcintosh .  Patient has chronic pain he used to take Neurontin but he had decided to take himself off from Neurontin.  He fell Neurontin did not help him.  Patient denies drinking or using any illegal substances.  He lives with his wife who is very supportive.  He wants to go back on Cymbalta .  His appetite is okay.  His vitals are stable.  Suicidal Ideation: No Plan Formed: No Patient has means to carry out plan: No  Homicidal Ideation: No Plan Formed: No Patient has means to carry out plan: No   Review of Systems  Psychiatric/Behavioral: Positive for depression. Negative for suicidal ideas, hallucinations and substance abuse. The patient is nervous/anxious and has insomnia.     Psychiatric: Agitation: No Hallucination: No Depressed Mood: Yes Insomnia: Yes Hypersomnia: No Altered Concentration: No Feels Worthless: No Grandiose Ideas: No Belief In Special Powers: No New/Increased Substance Abuse: No Compulsions: No  Neurologic: Headache: Yes Seizure: No Paresthesias: No  Medical History;  Patient has history of scrotal injury, benign prostrate hypercalcemia, hypertension, asthma, chronic back pain, chronic fatigue, hypothyroidism and dysfunction.  He is seeing Dr. Ashley Mcintosh.  Patient has history of back surgery, head trauma and concussion but do not remember the details very well.   He is taking multiple pain medication from pain management.  Outpatient Encounter Prescriptions as of 11/01/2014  Medication Sig  . albuterol (PROVENTIL HFA;VENTOLIN HFA) 108 (90 BASE) MCG/ACT inhaler Inhale 2 puffs into the lungs every 4 (four) hours as needed for wheezing or shortness of breath.  Marland Kitchen amLODipine-valsartan (EXFORGE) 5-320 MG per tablet Take 1 tablet by mouth daily.  . carisoprodol (SOMA) 350 MG tablet Take 1 tablet (350 mg total) by mouth 2 (two) times daily.  . cholecalciferol (VITAMIN D) 1000 UNITS tablet Take 2,000 Units by mouth daily.  Marland Kitchen doxazosin (CARDURA) 4 MG tablet TAKE 1 TABLET BY MOUTH ONCE DAILY  . DULoxetine (CYMBALTA) 60 MG capsule Take 1 capsule (60 mg total) by mouth daily.  Marland Kitchen levothyroxine (SYNTHROID, LEVOTHROID) 50 MCG tablet Take 1 tablet (50 mcg total) by mouth daily.  . meloxicam (MOBIC) 15 MG tablet Take 1 tablet (15 mg total) by mouth at bedtime.  Marland Kitchen oxyCODONE-acetaminophen (PERCOCET/ROXICET) 5-325 MG per tablet Take 1 tablet by mouth every 4 (four) hours as needed. Do not fill before 10/22/2014  . sildenafil (VIAGRA) 100 MG tablet Take 1 tablet (100 mg total) by mouth daily as needed for erectile dysfunction.  . [DISCONTINUED] DULoxetine (CYMBALTA) 60 MG capsule Take 1 capsule (60 mg total) by mouth daily.   Recent Results (from the past 2160 hour(s))  Urinalysis     Status: Abnormal   Collection Time: 08/12/14  9:32 AM  Result Value Ref Range   Color, Urine YELLOW YELLOW   APPearance CLEAR CLEAR   Specific Gravity, Urine 1.016 1.005 - 1.030   pH 7.5 5.0 - 8.0  Glucose, UA NEG NEG mg/dL   Bilirubin Urine NEG NEG   Ketones, ur NEG NEG mg/dL   Hgb urine dipstick TRACE (A) NEG   Protein, ur NEG NEG mg/dL   Urobilinogen, UA 0.2 0.0 - 1.0 mg/dL   Nitrite NEG NEG   Leukocytes, UA NEG NEG  Basic Metabolic Panel     Status: None   Collection Time: 08/12/14  9:36 AM  Result Value Ref Range   Sodium 139 135 - 145 mEq/L   Potassium 4.1 3.5 - 5.3 mEq/L    Chloride 105 96 - 112 mEq/L   CO2 26 19 - 32 mEq/L   Glucose, Bld 99 70 - 99 mg/dL   BUN 12 6 - 23 mg/dL   Creat 1.610.91 0.960.50 - 0.451.35 mg/dL   Calcium 9.1 8.4 - 40.910.5 mg/dL     Past Psychiatric History/Hospitalization(s) Anxiety: Yes Bipolar Disorder: No Depression: Yes Mania: No Psychosis: No Schizophrenia: No Personality Disorder: No Hospitalization for psychiatric illness: No History of Electroconvulsive Shock Therapy: No Prior Suicide Attempts: No  Physical Exam: Constitutional:  BP 136/82 mmHg  Pulse 75  Ht 5' 6.5" (1.689 m)  Wt 234 lb (106.142 kg)  BMI 37.21 kg/m2  Musculoskeletal: Strength & Muscle Tone: within normal limits Gait & Station: normal Patient leans: N/A  Mental Status Examination;  Patient is casually dressed and groomed.  He is anxious and maintained fair eye contact.  He described his mood depressed and anxious.  His affect is constricted. He denies any auditory or visual hallucination.  He denies any active or passive suicidal thoughts or homicidal thoughts.  His attention and concentration is fair.  His psychomotor activity is normal.  There were no delusions, paranoia or obsession present at this time.  There were no tremors or shakes present at this time.  His fund of knowledge is average.  He is alert and oriented x3.  His insight judgment and impulse control is okay.   Review of Psycho-Social Stressors (1), Review or order clinical lab tests (1), Review and summation of old records (2), Established Problem, Worsening (2), Review of Last Therapy Session (1) and Review of Medication Regimen & Side Effects (2)  Assessment: Axis I: Depressive disorder NOS, mood disorder due to general medical condition  Axis II: Deferred  Axis III: see medical history  Plan:  Reinforce medication compliance and follow-up appointments.  Patient like to go back on Cymbalta 60 mg which was helping his depression and anxiety.  Discussed medication side effects and  benefits.  A new position of Cymbalta 60 mg for 90 days is given .  Recommended to call us back if he has any question or any concern.  Follow-up in 3 months.  Patient like to follow-up in 6 months in the future .  We will discuss this option on his next appointment.  Encouraged to keep appointment with Nicholas LucksJennifer Brown.    Steve Youngberg T., MD 11/01/2014

## 2014-11-04 ENCOUNTER — Ambulatory Visit: Payer: Self-pay | Admitting: Internal Medicine

## 2014-11-10 ENCOUNTER — Ambulatory Visit (HOSPITAL_COMMUNITY): Payer: Self-pay | Admitting: Psychiatry

## 2014-11-12 ENCOUNTER — Telehealth: Payer: Self-pay | Admitting: Internal Medicine

## 2014-11-12 DIAGNOSIS — M549 Dorsalgia, unspecified: Principal | ICD-10-CM

## 2014-11-12 DIAGNOSIS — G8929 Other chronic pain: Secondary | ICD-10-CM

## 2014-11-12 NOTE — Telephone Encounter (Signed)
Refill request for percocet 5/325mg . LOV 08/12/2014. Please advise. ThankS

## 2014-11-16 MED ORDER — OXYCODONE-ACETAMINOPHEN 5-325 MG PO TABS: 1.0000 | ORAL_TABLET | ORAL | Status: DC | PRN

## 2014-11-16 NOTE — Telephone Encounter (Signed)
Prescription written for Percocet 5-325 mg #90 tabs. [place] reviewed and no inconsistencies noted. Pt cancelled appointment on 10/28/2014. He is required to be seen every 3 months and will not be able to receive another prescription for pain medications until he is seen in the office.

## 2014-11-16 NOTE — Telephone Encounter (Signed)
Please schedule patient for appointment.  Thanks. 

## 2014-12-03 ENCOUNTER — Ambulatory Visit (INDEPENDENT_AMBULATORY_CARE_PROVIDER_SITE_OTHER): Payer: 59 | Admitting: Family Medicine

## 2014-12-03 VITALS — BP 147/92 | HR 67 | Temp 98.3°F | Resp 16 | Ht 67.0 in | Wt 239.0 lb

## 2014-12-03 DIAGNOSIS — M549 Dorsalgia, unspecified: Secondary | ICD-10-CM

## 2014-12-03 DIAGNOSIS — G894 Chronic pain syndrome: Secondary | ICD-10-CM | POA: Diagnosis not present

## 2014-12-03 DIAGNOSIS — G8929 Other chronic pain: Secondary | ICD-10-CM

## 2014-12-03 DIAGNOSIS — I10 Essential (primary) hypertension: Secondary | ICD-10-CM | POA: Diagnosis not present

## 2014-12-03 DIAGNOSIS — F329 Major depressive disorder, single episode, unspecified: Secondary | ICD-10-CM

## 2014-12-03 DIAGNOSIS — M545 Low back pain, unspecified: Secondary | ICD-10-CM

## 2014-12-03 DIAGNOSIS — F32A Depression, unspecified: Secondary | ICD-10-CM

## 2014-12-03 MED ORDER — OXYCODONE-ACETAMINOPHEN 5-325 MG PO TABS: 1.0000 | ORAL_TABLET | Freq: Four times a day (QID) | ORAL | Status: DC | PRN

## 2014-12-03 NOTE — Progress Notes (Addendum)
Subjective:    Patient ID: Nicholas Mcintosh, male    DOB: May 19, 1966, 49 y.o.   MRN: [id number]  HPI Nicholas Mcintosh, a 49 year old male with a history of hypertension, depression and chronic back pain, presents for a follow up of chronic back pain. Nicholas Mcintosh states that he has constant back pain daily that interferes with his activities of daily living. Nicholas Mcintosh uses a cane for ambulation with walking long distances.  He is also has chronic pain and is on multiple medications for pain management including Percocet, Soma and Mobic. We have discussed multiple times in the past the need for referral to a pain specialist however this has been deferred as the patient was dealing with several IADL issues and depression, which  were major stessers. However they have been resolved and we are now discussing referral to pain specialist. He states that his current pain intensity is 3/10 described as constant and aching. Pain intensity was moderately relieved by Percocet 5-325, last taken around 11 am.  Nicholas Mcintosh denies headache, dizziness, fatigue, chest pain, shortness of breath, lower extremity edema, nausea, vomiting, or diarrhea.  Past Medical History  Diagnosis Date  . Asthma   . Hypertension   . Hypothyroidism   . New-onset angina 07/12/2011  . Back pain     Secondary to 2-story fall 2006   History   Social History  . Marital Status: Married    Spouse Name: N/A  . Number of Children: N/A  . Years of Education: N/A   Occupational History  . Not on file.   Social History Main Topics  . Smoking status: Never Smoker   . Smokeless tobacco: Not on file  . Alcohol Use: No  . Drug Use: No  . Sexual Activity: No   Other Topics Concern  . Not on file   Social History Narrative    Review of Systems  Constitutional: Negative.   HENT: Negative.   Eyes: Negative.   Respiratory: Negative.   Cardiovascular: Negative.   Gastrointestinal: Negative.   Endocrine: Negative.    Genitourinary: Negative.   Musculoskeletal: Positive for myalgias and back pain.  Skin: Negative.   Allergic/Immunologic: Negative.   Neurological: Negative.   Hematological: Negative.   Psychiatric/Behavioral: Negative.        Patient is under the care of a counselor for depression, controlled on current medication.        Objective:   Physical Exam  Constitutional: He is oriented to person, place, and time. He appears well-developed and well-nourished.  HENT:  Head: Normocephalic and atraumatic.  Right Ear: External ear normal.  Left Ear: External ear normal.  Mouth/Throat: Oropharynx is clear and moist.  Eyes: Conjunctivae are normal. Pupils are equal, round, and reactive to light.  Neck: Normal range of motion. Neck supple.  Cardiovascular: Normal rate, regular rhythm, normal heart sounds and intact distal pulses.   Pulmonary/Chest: Effort normal and breath sounds normal.  Abdominal: Soft. Bowel sounds are normal.  Musculoskeletal:       Lumbar back: He exhibits decreased range of motion and pain. He exhibits no swelling, no edema and no spasm.  Neurological: He is alert and oriented to person, place, and time. He has normal reflexes.  Skin: Skin is warm and dry.  Psychiatric: He has a normal mood and affect. His behavior is normal. Judgment and thought content normal.         BP 147/92 mmHg  Pulse 67  Temp(Src) 98.3 F (36.8 C) (  Oral)  Resp 16  Ht 5\' 7"  (1.702 Mcintosh)  Wt 239 lb (108.41 kg)  BMI 37.42 kg/m2  Assessment & Plan:  1. Chronic back pain We agreed on previously plan on titrating Nicholas Mcintosh's  medication dose. Patient will Receive Percocet 5-325 mg every 6 hours as needed for severe low back pain. Reviewed opiate scale, score is 1, PHQ-9 is 0 on current counseling and medication schedule. Patient has some difficulty with activities of daily living due to chronic back pain. He states that he often ambulates with a can due to difficulty walking long distances.   Reviewed [place] Substance Reporting system prior to reorder, there are no inconsistencies noted. Patient will return to clinic for pain re-assessment in 1 month.   Patient reports that pain management is cost prohibitive. His co-pay would be $200. We discussed that he is to continue receiving his Schedule II prescriptions only from us.  He is also aware that his prescription history is available to us online through the [facility] CSRS. Controlled substance agreement signed previously, we discussed at length.  We reminded Nicholas Mcintosh that all patients receiving Schedule II narcotics must be seen for follow up every three months. We reviewed the terms of our pain agreement, including the need to keep medicines in a safe locked location away from children or pets, and the need to report excess sedation or constipation, measures to avoid constipation, and policies related to early refills and stolen prescriptions. According to the [place] [facility] program, we have reviewed details related to analgesia, adverse effects, aberrant behaviors. Nicholas Mcintosh was given a copy of the pain medication agreement form.  Iron overload from chronic transfusion.   - oxyCODONE-acetaminophen (PERCOCET/ROXICET) 5-325 MG per tablet; Take 1 tablet by mouth every 6 (six) hours as needed for moderate pain.  Dispense: 60 tablet; Refill: 0 - Ambulatory referral to Pain Clinic  2. Bilateral low back pain without sciatica Nicholas Mcintosh was given back stretches on last appointment, but has not attempted stretches.  - Ambulatory referral to Pain Clinic  3. Chronic pain syndrome - Prescription Monitoring Profile (17)-[facility] - Ambulatory referral to Pain Clinic  4. Essential hypertension Blood pressure is not at goal on current medication regimen. He states that he has been taking blood pressure medications consistently. Will continue current medication regimen. Patient is not currently following a low fat diet and is not exercising  due to back pain. He states that his blood pressure has been controlled at home.   5. Depression Depression is currently controlled on Cymbalta 60 mg daily and continuous counseling. He states that counseling has been effective. Patient currently denies suicidal or homicidal ideations.    Nicholas Estelle M, FNP  RTC: 1 month for medication management with [name]

## 2014-12-06 ENCOUNTER — Encounter: Payer: Self-pay | Admitting: Family Medicine

## 2014-12-06 NOTE — Patient Instructions (Signed)

## 2014-12-07 LAB — OPIATES/OPIOIDS (LC/MS-MS)
Codeine Urine: NEGATIVE ng/mL (ref <50)
HYDROMORPHONE: NEGATIVE ng/mL (ref <50)
Hydrocodone: NEGATIVE ng/mL (ref <50)
Morphine Urine: NEGATIVE ng/mL (ref <50)
NORHYDROCODONE, UR: NEGATIVE ng/mL (ref <50)
NOROXYCODONE, UR: 718 ng/mL — AB (ref <50)
Oxycodone, ur: 14641 ng/mL — AB (ref <50)
Oxymorphone: 523 ng/mL — AB (ref <50)

## 2014-12-07 LAB — OXYCODONE, URINE (LC/MS-MS)
NOROXYCODONE, UR: 718 ng/mL — AB (ref <50)
Oxycodone, ur: 14641 ng/mL — AB (ref <50)
Oxymorphone: 523 ng/mL — AB (ref <50)

## 2014-12-08 ENCOUNTER — Ambulatory Visit (INDEPENDENT_AMBULATORY_CARE_PROVIDER_SITE_OTHER): Payer: 59 | Admitting: Psychiatry

## 2014-12-08 DIAGNOSIS — F329 Major depressive disorder, single episode, unspecified: Secondary | ICD-10-CM | POA: Diagnosis not present

## 2014-12-08 DIAGNOSIS — F32A Depression, unspecified: Secondary | ICD-10-CM

## 2014-12-08 LAB — PRESCRIPTION MONITORING PROFILE (SOLSTAS)
Amphetamine/Meth: NEGATIVE ng/mL
BENZODIAZEPINE SCREEN, URINE: NEGATIVE ng/mL
Barbiturate Screen, Urine: NEGATIVE ng/mL
Buprenorphine, Urine: NEGATIVE ng/mL
CREATININE, URINE: 71.41 mg/dL (ref >20.0)
Cannabinoid Scrn, Ur: NEGATIVE ng/mL
Carisoprodol, Urine: NEGATIVE ng/mL
Cocaine Metabolites: NEGATIVE ng/mL
Fentanyl, Ur: NEGATIVE ng/mL
MDMA URINE: NEGATIVE ng/mL
Meperidine, Ur: NEGATIVE ng/mL
Methadone Screen, Urine: NEGATIVE ng/mL
NITRITES URINE, INITIAL: NEGATIVE ug/mL
PH URINE, INITIAL: 6.3 pH (ref 4.5–8.9)
PROPOXYPHENE: NEGATIVE ng/mL
TRAMADOL UR: NEGATIVE ng/mL
Tapentadol, urine: NEGATIVE ng/mL
ZOLPIDEM, URINE: NEGATIVE ng/mL

## 2014-12-09 NOTE — Progress Notes (Signed)
   THERAPIST PROGRESS NOTE  Session Time: 1:30-2:30  Participation Level: Active   Behavioral Response: CasualAlertEuthymic   Type of Therapy: Individual Therapy   Treatment Goals addressed: emotion regulation   Interventions: Strength-based and Supportive   Summary: Nicholas Mcintosh is a 49 y.o. male who presents with depression.   Suicidal/Homicidal: Nowithout intent/plan   Therapist Observations/Response: Pt. Presented with positive mood, smiled and talked appropriately. 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 Pt. Reports interest in exploration, spontaneity, adventure; and some frustration that his wife is not interested in travel or adventure/exploration. Pt. Reports that he has remained active in the last few months. Pt. Reports that he and wife decided to move their son to public school and that his son is happy with the decision. Pt. Continues interest in son's athletics. Pt. Reports continued pattern of coping with depression by immersing himself in his son's activities. Session focused on ways that Pt. Can exercise self-care and needs for adventure ie. Finding opportunities to travel by himself and exploring meetup groups to meet friends with similar interests.   Plan: Pt. To continue with CBT focused treatment. Return again in 2-4 weeks.   Diagnosis: Axis I: Depressive Disorder NOS   Axis II: No diagnosis    Nicholas Mcintosh, Nicholas Mcintosh, [facility] 12/09/2014

## 2014-12-13 ENCOUNTER — Other Ambulatory Visit: Payer: Self-pay | Admitting: Internal Medicine

## 2014-12-13 DIAGNOSIS — G8929 Other chronic pain: Secondary | ICD-10-CM

## 2014-12-13 DIAGNOSIS — M549 Dorsalgia, unspecified: Principal | ICD-10-CM

## 2014-12-13 MED ORDER — OXYCODONE-ACETAMINOPHEN 5-325 MG PO TABS: 1.0000 | ORAL_TABLET | Freq: Four times a day (QID) | ORAL | Status: DC | PRN

## 2014-12-13 NOTE — Telephone Encounter (Signed)
Refill request for percocet 5/325mg . LOV 12/03/2014. Please advise. Thanks!

## 2014-12-13 NOTE — Telephone Encounter (Signed)
Nicholas Mcintosh was last evaluated on 12/06/2014, a referral was also sent to pain management. Reviewed [place] Substance Reporting system prior to reorder, no inconsistencies noted. Patient will need to follow up as scheduled.   Meds ordered this encounter  Medications  . oxyCODONE-acetaminophen (PERCOCET/ROXICET) 5-325 MG per tablet    Sig: Take 1 tablet by mouth every 6 (six) hours as needed for moderate pain.    Dispense:  60 tablet    Refill:  0    Rx not to be filled prior to 12/18/2014    Order Specific Question:  Supervising Provider    Answer:  Marthann SchillerMATTHEWS, MICHELLE A [[id number]]    Massie MaroonHollis,Jered Heiny M, FNP

## 2014-12-28 ENCOUNTER — Telehealth: Payer: Self-pay | Admitting: Internal Medicine

## 2014-12-28 NOTE — Telephone Encounter (Signed)
Refill request for percocet 5/325mg . LOV 12/03/2014. Please advise. Thanks!

## 2014-12-28 NOTE — Telephone Encounter (Signed)
Mr. Nicholas Mcintosh has a 1 month follow up appointment scheduled on Monday, 01/03/2015. Patient will need to be re-assessed prior to receiving Rx.   Massie MaroonHollis,Decklin Weddington M, FNP

## 2015-01-03 ENCOUNTER — Telehealth: Payer: Self-pay | Admitting: Internal Medicine

## 2015-01-03 ENCOUNTER — Ambulatory Visit (INDEPENDENT_AMBULATORY_CARE_PROVIDER_SITE_OTHER): Payer: 59 | Admitting: Internal Medicine

## 2015-01-03 VITALS — BP 151/71 | HR 75 | Temp 98.3°F | Resp 16 | Ht 67.0 in | Wt 240.0 lb

## 2015-01-03 DIAGNOSIS — G894 Chronic pain syndrome: Secondary | ICD-10-CM | POA: Diagnosis not present

## 2015-01-03 DIAGNOSIS — I10 Essential (primary) hypertension: Secondary | ICD-10-CM | POA: Diagnosis not present

## 2015-01-03 DIAGNOSIS — G8929 Other chronic pain: Secondary | ICD-10-CM

## 2015-01-03 DIAGNOSIS — M549 Dorsalgia, unspecified: Secondary | ICD-10-CM | POA: Diagnosis not present

## 2015-01-03 DIAGNOSIS — E782 Mixed hyperlipidemia: Secondary | ICD-10-CM | POA: Diagnosis not present

## 2015-01-03 DIAGNOSIS — Z79899 Other long term (current) drug therapy: Secondary | ICD-10-CM

## 2015-01-03 DIAGNOSIS — Z79891 Long term (current) use of opiate analgesic: Secondary | ICD-10-CM

## 2015-01-03 MED ORDER — ATORVASTATIN CALCIUM 40 MG PO TABS: 40.0000 mg | ORAL_TABLET | Freq: Every day | ORAL | Status: DC

## 2015-01-03 MED ORDER — OXYCODONE-ACETAMINOPHEN 5-325 MG PO TABS: 1.0000 | ORAL_TABLET | Freq: Four times a day (QID) | ORAL | Status: DC | PRN

## 2015-01-03 NOTE — Progress Notes (Signed)
Patient ID: Nicholas Mcintosh, male   DOB: 1965-11-12, 49 y.o.   MRN: [id number]   Nicholas Mcintosh, is a 49 y.o. male  [id number]  [id number]  DOB - 01/24/1966  CC:  Chief Complaint  Patient presents with  . Follow-up       HPI: Nicholas Mcintosh is a 49 y.o. male here today to establish medical care. Patient has No headache, No chest pain, No abdominal pain - No Nausea, No new weakness tingling or numbness, No Cough - SOB.  Allergies  Allergen Reactions  . Molds & Smuts     Asthma attacks around molds   Past Medical History  Diagnosis Date  . Asthma   . Hypertension   . Hypothyroidism   . New-onset angina 07/12/2011  . Back pain     Secondary to 2-story fall 2006   Current Outpatient Prescriptions on File Prior to Visit  Medication Sig Dispense Refill  . amLODipine-valsartan (EXFORGE) 5-320 MG per tablet Take 1 tablet by mouth daily. 30 tablet 2  . cholecalciferol (VITAMIN D) 1000 UNITS tablet Take 2,000 Units by mouth daily.    Marland Kitchen doxazosin (CARDURA) 4 MG tablet TAKE 1 TABLET BY MOUTH ONCE DAILY 30 tablet 3  . DULoxetine (CYMBALTA) 60 MG capsule Take 1 capsule (60 mg total) by mouth daily. 90 capsule 0  . levothyroxine (SYNTHROID, LEVOTHROID) 50 MCG tablet Take 1 tablet (50 mcg total) by mouth daily. 90 tablet 3  . albuterol (PROVENTIL HFA;VENTOLIN HFA) 108 (90 BASE) MCG/ACT inhaler Inhale 2 puffs into the lungs every 4 (four) hours as needed for wheezing or shortness of breath. (Patient not taking: Reported on 11/01/2014) 1 Inhaler 3  . sildenafil (VIAGRA) 100 MG tablet Take 1 tablet (100 mg total) by mouth daily as needed for erectile dysfunction. (Patient not taking: Reported on 01/03/2015) 10 tablet 0   No current facility-administered medications on file prior to visit.   Family History  Problem Relation Age of Onset  . Hypertension Mother   . Cancer Maternal Aunt     Breast   History   Social History  . Marital Status: Married    Spouse Name: N/A  . Number  of Children: N/A  . Years of Education: N/A   Occupational History  . Not on file.   Social History Main Topics  . Smoking status: Never Smoker   . Smokeless tobacco: Not on file  . Alcohol Use: No  . Drug Use: No  . Sexual Activity: No   Other Topics Concern  . Not on file   Social History Narrative    Review of Systems: Constitutional: Negative for fever, chills, diaphoresis, activity change, appetite change and fatigue. HENT: Negative for ear pain, nosebleeds, congestion, facial swelling, rhinorrhea, neck pain, neck stiffness and ear discharge.  Eyes: Negative for pain, discharge, redness, itching and visual disturbance. Respiratory: Negative for cough, choking, chest tightness, shortness of breath, wheezing and stridor.  Cardiovascular: Negative for chest pain, palpitations and leg swelling. Gastrointestinal: Negative for abdominal distention. Genitourinary: Negative for dysuria, urgency, frequency, hematuria, flank pain, decreased urine volume, difficulty urinating and dyspareunia.  Musculoskeletal: Negative for back pain, joint swelling, arthralgia and gait problem. Neurological: Negative for dizziness, tremors, seizures, syncope, facial asymmetry, speech difficulty, weakness, light-headedness, numbness and headaches.  Hematological: Negative for adenopathy. Does not bruise/bleed easily. Psychiatric/Behavioral: Negative for hallucinations, behavioral problems, confusion, dysphoric mood, decreased concentration and agitation.     Objective:   Filed Vitals:   01/03/15 1314  BP: 151/71  Pulse: 75  Temp: 98.3 F (36.8 C)  Resp: 16    Physical Exam: Constitutional: Patient appears well-developed and well-nourished. No distress. HENT: Normocephalic, atraumatic, External right and left ear normal. Oropharynx is clear and moist.  Eyes: Conjunctivae and EOM are normal. PERRLA, no scleral icterus. Neck: Normal ROM. Neck supple. No JVD. No tracheal deviation. No  thyromegaly. CVS: RRR, S1/S2 +, no murmurs, no gallops, no carotid bruit.  Pulmonary: Effort and breath sounds normal, no stridor, rhonchi, wheezes, rales.  Abdominal: Soft. BS +, no distension, tenderness, rebound or guarding.  Musculoskeletal: Normal range of motion. No edema and no tenderness.  Lymphadenopathy: No lymphadenopathy noted, cervical, inguinal or axillary Neuro: Alert. Normal reflexes, muscle tone coordination. No cranial nerve deficit. Skin: Skin is warm and dry. No rash noted. Not diaphoretic. No erythema. No pallor. Psychiatric: Normal mood and affect. Behavior, judgment, thought content normal.   Lab Results  Component Value Date   WBC 4.4 04/28/2014   HGB 12.3* 04/28/2014   HCT 37.8* 04/28/2014   MCV 80.3 04/28/2014   PLT 228 04/28/2014   Lab Results  Component Value Date   CREATININE 0.91 08/12/2014   BUN 12 08/12/2014   NA 139 08/12/2014   K 4.1 08/12/2014   CL 105 08/12/2014   CO2 26 08/12/2014    Lab Results  Component Value Date   HGBA1C 5.6 04/15/2014   Lipid Panel     Component Value Date/Time   CHOL 131 04/15/2014 1447   TRIG 170* 04/15/2014 1447   HDL 38* 04/15/2014 1447   CHOLHDL 3.4 04/15/2014 1447   VLDL 34 04/15/2014 1447   LDLCALC 59 04/15/2014 1447       Assessment and plan:   1. Hypercholesterolemia with hypertriglyceridemia - Calculated ASCVD 10 year risk is 10.8%. Had long discussion with patient on risks and benefits. He is agreeable to starting medications. Will start on Atorvastatin. - atorvastatin (LIPITOR) 40 MG tablet; Take 1 tablet (40 mg total) by mouth daily.  Dispense: 90 tablet; Refill: 3  2. Essential hypertension - pt has had persistently elevated BP here in the office. However he states that his ambulatory BP are much lower. His wife is an experienced nurse and I have asked that she check his BP at home and bring a log of his BP in for review. He remains resistant to escalating his antihypertensive regimen. - Pt  has been resistant to taking a diuretic due to erectile dysfunction. He is unsure if he is taking Cardura as well as Exforge. He will check his medications at home and if he is taking Exforge and Cardura,  then he will need to add a diuretic. - Discontinue Mobic as this can also lead to elevated BP.  3. Chronic pain syndrome - Pt has chronic back pain and has been on a low dose of Percocet for several years. He has maintained his function with this dose of medication and has not required any escalation in dose. Attempts made to wean the dose have resulted in a disruption of sleep and function. He is currently undergoing Physical Therapy and doing well with this dose. He has been able to significantly decrease the use of SOMA which he also had been taking at a TID dosage for years. We agreed on Percocet 5-325 mg 120 tabs per month) with no current plan on titrating his dose. His current MME is  30 mg. Pt will receive a monthly prescription.We discussed that pt is to receive his Schedule II prescriptions only from us.  Pt is also aware that the prescription history is available to [place] online through the [facility] CSRS. Controlled substance agreement signed (Date). We reminded (Pt) that all patients receiving Schedule II narcotics must be seen for follow within one month of prescription being requested. We reviewed the terms of our pain agreement, including the need to keep medicines in a safe locked location away from children or pets, and the need to report excess sedation or constipation, measures to avoid constipation, and policies related to early refills and stolen prescriptions. According to the [place] [facility] program, we have reviewed details related to analgesia, adverse effects, aberrant behaviors.   4. Chronic back pain - Per patient agreement, SOMA is discontinued. Also discontinued Mobic due to effects on BP. Patient is very resistant to taking lots of medication and wants to try to find ways to  minimize his polypharmacy. - oxyCODONE-acetaminophen (PERCOCET/ROXICET) 5-325 MG per tablet; Take 1 tablet by mouth every 6 (six) hours as needed for moderate pain.  Dispense: 120 tablet; Refill: 0  5. Chronic prescription opiate use - See above. Reversal agent prescribed. - Naloxone HCl (NARCAN) 4 MG/0.1ML LIQD; Place 4 mg into the nose once. May repeat q3-5 minutes if sx recur  Dispense: 2 each; Refill: 1   Return for HTN, Back pain, Hypothyroidism, Hyperlipidemia.  The patient was given clear instructions to go to ER or return to medical center if symptoms don't improve, worsen or new problems develop. The patient verbalized understanding. The patient was told to call to get lab results if they haven't heard anything in the next week.     This note has been created with Education officer, environmental. Any transcriptional errors are unintentional.    Kaylynne Andres A., MD [facility] [place], [place] [phone]   01/03/2015, 2:11 PM

## 2015-01-03 NOTE — Telephone Encounter (Signed)
Patient called to inform provider the name of the medication he is taking at home is Cardura 4mg  .

## 2015-01-04 MED ORDER — NALOXONE HCL 4 MG/0.1ML NA LIQD: 4.0000 mg | Freq: Once | NASAL | Status: DC

## 2015-01-05 ENCOUNTER — Other Ambulatory Visit: Payer: Self-pay | Admitting: Internal Medicine

## 2015-01-12 ENCOUNTER — Ambulatory Visit (INDEPENDENT_AMBULATORY_CARE_PROVIDER_SITE_OTHER): Payer: 59 | Admitting: Psychiatry

## 2015-01-12 DIAGNOSIS — F329 Major depressive disorder, single episode, unspecified: Secondary | ICD-10-CM

## 2015-01-12 DIAGNOSIS — F32A Depression, unspecified: Secondary | ICD-10-CM

## 2015-01-13 NOTE — Progress Notes (Signed)
   THERAPIST PROGRESS NOTE  Session Time: 1:35-2:35  Participation Level: Active   Behavioral Response: CasualAlertEuthymic   Type of Therapy: Individual Therapy   Treatment Goals addressed: emotion regulation   Interventions: Strength-based and Supportive   Summary: Nicholas Mcintosh is a 49 y.o. male who presents with depression.   Suicidal/Homicidal: Nowithout intent/plan   Therapist Observations/Response: Pt. Continues to present with euthymic mood. Pt. Reports that his pain management medication is going well, but not happy with relationship with primary care physician who he perceives as not understanding injury and pain history. Pt. Reports that relationship with wife is stable. Pt. Reports that he continues to manage stress primarily by focusing on son's activities and sports. Pt. Reports that over the weekend he drove to [facility] to meet with friends and returned the following day. This was a significant shift and consistent with Pt.'s goal of taking road trips for leisure and reconnection with friends. Pt. Reports that he is planning another trip to the beach by himself and continues to enjoy cooking as a form of stress relief.   Plan: Pt. To continue with CBT focused treatment. Return again in 2-4 weeks.   Diagnosis: Axis I: Depressive Disorder NOS   Axis II: No diagnosis    Shaune Pollack, [facility] 01/13/2015

## 2015-01-17 ENCOUNTER — Encounter: Payer: Self-pay | Admitting: Internal Medicine

## 2015-01-24 ENCOUNTER — Ambulatory Visit (INDEPENDENT_AMBULATORY_CARE_PROVIDER_SITE_OTHER): Payer: 59 | Admitting: Family Medicine

## 2015-01-24 ENCOUNTER — Encounter: Payer: Self-pay | Admitting: Family Medicine

## 2015-01-24 VITALS — BP 144/78 | HR 68 | Temp 98.1°F | Resp 16 | Ht 67.0 in | Wt 238.0 lb

## 2015-01-24 DIAGNOSIS — I10 Essential (primary) hypertension: Secondary | ICD-10-CM

## 2015-01-24 NOTE — Progress Notes (Signed)
Patient ID: Nicholas Mcintosh, male   DOB: [birth year], 49 y.o.   MRN: [id number]   Nicholas Mcintosh, is a 49 y.o. male  [id number]  [id number]  DOB - [birth year]  CC: No chief complaint on file.      HPI: Nicholas Hamntonio Fyfe presents today for a BP check. He was seen on June 6th here by [name] and changes were make his BP. She asked him to return with a record of his home readings. H Patient has No headache, No chest pain, No abdominal pain - No Nausea, No new weakness tingling or numbness, No Cough - SOB.He wants nothing else checked today as he has an appointment for a physical in about 3 weeks. Mr. Nicholas Mcintosh has a chronic pain syndrome with back pain. On his last visit [name] discontinued his Tresa GarterSoma and decrease his Oxycodone from 180 per month to 120 per month. She has been trying to get him into a pain clinic without success to this point. He reports that [facility] cannot [facility] and will [facility] card. I have explained that we may not be able to continue his Percocet and I will check on getting him into [facility]. Other issues will be addressed as his visit in 3 weeks.  Allergies  Allergen Reactions  . Molds & Smuts     Asthma attacks around molds   Past Medical History  Diagnosis Date  . Asthma   . Hypertension   . Hypothyroidism   . New-onset angina 07/12/2011  . Back pain     Secondary to 2-story fall 2006   Current Outpatient Prescriptions on File Prior to Visit  Medication Sig Dispense Refill  . albuterol (PROVENTIL HFA;VENTOLIN HFA) 108 (90 BASE) MCG/ACT inhaler Inhale 2 puffs into the lungs every 4 (four) hours as needed for wheezing or shortness of breath. (Patient not taking: Reported on 11/01/2014) 1 Inhaler 3  . amLODipine-valsartan (EXFORGE) 5-320 MG per tablet TAKE 1 TABLET BY MOUTH DAILY. 30 tablet 2  . atorvastatin (LIPITOR) 40 MG tablet Take 1 tablet (40 mg total) by mouth daily. 90 tablet 3  .  cholecalciferol (VITAMIN D) 1000 UNITS tablet Take 2,000 Units by mouth daily.    Marland Kitchen. doxazosin (CARDURA) 4 MG tablet TAKE 1 TABLET BY MOUTH ONCE DAILY 30 tablet 3  . DULoxetine (CYMBALTA) 60 MG capsule Take 1 capsule (60 mg total) by mouth daily. 90 capsule 0  . levothyroxine (SYNTHROID, LEVOTHROID) 50 MCG tablet Take 1 tablet (50 mcg total) by mouth daily. 90 tablet 3  . Naloxone HCl (NARCAN) 4 MG/0.1ML LIQD Place 4 mg into the nose once. May repeat q3-5 minutes if sx recur 2 each 1  . oxyCODONE-acetaminophen (PERCOCET/ROXICET) 5-325 MG per tablet Take 1 tablet by mouth every 6 (six) hours as needed for moderate pain. 120 tablet 0  . sildenafil (VIAGRA) 100 MG tablet Take 1 tablet (100 mg total) by mouth daily as needed for erectile dysfunction. (Patient not taking: Reported on 01/03/2015) 10 tablet 0   No current facility-administered medications on file prior to visit.   Family History  Problem Relation Age of Onset  . Hypertension Mother   . Cancer Maternal Aunt     Breast   History   Social History  . Marital Status: Married    Spouse Name: N/A  . Number of Children: N/A  . Years of Education: N/A   Occupational History  . Not on file.  Social History Main Topics  . Smoking status: Never Smoker   . Smokeless tobacco: Not on file  . Alcohol Use: No  . Drug Use: No  . Sexual Activity: No   Other Topics Concern  . Not on file   Social History Narrative   ROS:  Const: Denies fever, chills, wt loss, appetite loss Resp: Denies shortness of breath, cough Card:  Denies chest pain, palpitations, swelling of feet and lets GI:  Denies chest pain, N, V, D. MS: Positive for back and joint pain.  Objective:  There were no vitals filed for this visit.  Lab Results  Component Value Date   WBC 4.4 04/28/2014   HGB 12.3* 04/28/2014   HCT 37.8* 04/28/2014   MCV 80.3 04/28/2014   PLT 228 04/28/2014   Lab Results  Component Value Date   CREATININE 0.91 08/12/2014   BUN 12  08/12/2014   NA 139 08/12/2014   K 4.1 08/12/2014   CL 105 08/12/2014   CO2 26 08/12/2014    Lab Results  Component Value Date   HGBA1C 5.6 04/15/2014   Lipid Panel     Component Value Date/Time   CHOL 131 04/15/2014 1447   TRIG 170* 04/15/2014 1447   HDL 38* 04/15/2014 1447   CHOLHDL 3.4 04/15/2014 1447   VLDL 34 04/15/2014 1447   LDLCALC 59 04/15/2014 1447       Assessment and plan:   Hypertension: - Continue current medication regime. - Avoid excess salt intake  Other chronic conditions to be addressed at physical in 3 weeks.       The patient was given clear instructions to go to ER or return to medical center if symptoms don't improve, worsen or new problems develop. The patient verbalized understanding. The patient was told to call to get lab results if they haven't heard anything in the next week.        Henrietta Hoover, MSN, FNP-BC [facility] [place], [place] [phone]   01/24/2015, 1:16 PM

## 2015-01-26 ENCOUNTER — Telehealth: Payer: Self-pay | Admitting: Family Medicine

## 2015-01-26 NOTE — Telephone Encounter (Signed)
Refill request for percocet 5/325mg . LOV 01/24/2015 Please advise. Thanks!

## 2015-01-30 ENCOUNTER — Emergency Department (HOSPITAL_COMMUNITY)
Admission: EM | Admit: 2015-01-30 | Discharge: 2015-01-30 | Disposition: A | Discharge status: Home / Self Care | Payer: 59 | Attending: Emergency Medicine | Admitting: Emergency Medicine

## 2015-01-30 ENCOUNTER — Encounter (HOSPITAL_COMMUNITY): Payer: Self-pay | Admitting: Emergency Medicine

## 2015-01-30 DIAGNOSIS — Z79899 Other long term (current) drug therapy: Secondary | ICD-10-CM | POA: Diagnosis not present

## 2015-01-30 DIAGNOSIS — I10 Essential (primary) hypertension: Secondary | ICD-10-CM | POA: Insufficient documentation

## 2015-01-30 DIAGNOSIS — I209 Angina pectoris, unspecified: Secondary | ICD-10-CM | POA: Diagnosis not present

## 2015-01-30 DIAGNOSIS — J45901 Unspecified asthma with (acute) exacerbation: Secondary | ICD-10-CM | POA: Diagnosis not present

## 2015-01-30 DIAGNOSIS — R062 Wheezing: Secondary | ICD-10-CM | POA: Diagnosis present

## 2015-01-30 DIAGNOSIS — E039 Hypothyroidism, unspecified: Secondary | ICD-10-CM | POA: Diagnosis not present

## 2015-01-30 MED ORDER — METHYLPREDNISOLONE SODIUM SUCC 125 MG IJ SOLR
125.0000 mg | Freq: Once | INTRAMUSCULAR | Status: AC
  Administered 2015-01-30: 125 mg via INTRAMUSCULAR
  Filled 2015-01-30: qty 2

## 2015-01-30 MED ORDER — ALBUTEROL SULFATE (2.5 MG/3ML) 0.083% IN NEBU
5.0000 mg | INHALATION_SOLUTION | Freq: Once | RESPIRATORY_TRACT | Status: AC
  Administered 2015-01-30: 5 mg via RESPIRATORY_TRACT

## 2015-01-30 MED ORDER — IPRATROPIUM-ALBUTEROL 0.5-2.5 (3) MG/3ML IN SOLN
RESPIRATORY_TRACT | Status: AC
  Administered 2015-01-30: 3 mL via RESPIRATORY_TRACT
  Filled 2015-01-30: qty 3

## 2015-01-30 MED ORDER — IPRATROPIUM-ALBUTEROL 0.5-2.5 (3) MG/3ML IN SOLN
3.0000 mL | Freq: Once | RESPIRATORY_TRACT | Status: AC
  Administered 2015-01-30: 3 mL via RESPIRATORY_TRACT

## 2015-01-30 MED ORDER — PREDNISONE 10 MG PO TABS: 20.0000 mg | ORAL_TABLET | Freq: Two times a day (BID) | ORAL | Status: DC

## 2015-01-30 NOTE — ED Notes (Signed)
Awake. Verbally responsive. A/O x4. Resp even and unlabored. No audible adventitious breath sounds noted. ABC's intact.  

## 2015-01-30 NOTE — ED Notes (Signed)
Bed: WA07 Expected date:  Expected time:  Means of arrival:  Comments: Triage 2 

## 2015-01-30 NOTE — ED Notes (Signed)
Awake. Verbally responsive. Resp even and unlabored. No audible adventitious breath sounds. Pt reported occ nonproductive cough. SR on monitor.

## 2015-01-30 NOTE — ED Provider Notes (Signed)
CSN: [id number]     Arrival date & time 01/30/15  0624 History   First MD Initiated Contact with Patient 01/30/15 0715     Chief Complaint  Patient presents with  . Asthma     (Consider location/radiation/quality/duration/timing/severity/associated sxs/prior Treatment) HPI Comments: Patient is a 49 year old male with history of asthma. He presents for evaluation of wheezing and difficulty breathing that started in the night. He believes he is exposed to dust and this is what set him off. He denies any fevers or chills. He denies any chest pain or productive cough.  Patient is a 49 y.o. male presenting with asthma. The history is provided by the patient.  Asthma This is a recurrent problem. The problem occurs constantly. The problem has not changed since onset.Associated symptoms include shortness of breath. Pertinent negatives include no chest pain. Nothing aggravates the symptoms. Nothing relieves the symptoms. He has tried nothing for the symptoms. The treatment provided no relief.    Past Medical History  Diagnosis Date  . Asthma   . Hypertension   . Hypothyroidism   . New-onset angina 07/12/2011  . Back pain     Secondary to 2-story fall 2006   Past Surgical History  Procedure Laterality Date  . Surgery on scrotum    . Cardiac catheterization  2012   Family History  Problem Relation Age of Onset  . Hypertension Mother   . Cancer Maternal Aunt     Breast   History  Substance Use Topics  . Smoking status: Never Smoker   . Smokeless tobacco: Not on file  . Alcohol Use: No    Review of Systems  Respiratory: Positive for shortness of breath.   Cardiovascular: Negative for chest pain.  All other systems reviewed and are negative.     Allergies  Molds & smuts  Home Medications   Prior to Admission medications   Medication Sig Start Date End Date Taking? Authorizing Provider  albuterol (PROVENTIL HFA;VENTOLIN HFA) 108 (90 BASE) MCG/ACT inhaler Inhale 2 puffs into  the lungs every 4 (four) hours as needed for wheezing or shortness of breath. Patient not taking: Reported on 11/01/2014 05/01/14   Cathren LaineKevin Steinl, MD  amLODipine-valsartan (EXFORGE) 5-320 MG per tablet TAKE 1 TABLET BY MOUTH DAILY. 01/05/15   Altha HarmMichelle A Matthews, MD  atorvastatin (LIPITOR) 40 MG tablet Take 1 tablet (40 mg total) by mouth daily. 01/03/15   Altha HarmMichelle A Matthews, MD  cholecalciferol (VITAMIN D) 1000 UNITS tablet Take 2,000 Units by mouth daily.    Historical Provider, MD  doxazosin (CARDURA) 4 MG tablet TAKE 1 TABLET BY MOUTH ONCE DAILY 10/19/14   Altha HarmMichelle A Matthews, MD  DULoxetine (CYMBALTA) 60 MG capsule Take 1 capsule (60 mg total) by mouth daily. 11/01/14   Cleotis NipperSyed T Arfeen, MD  levothyroxine (SYNTHROID, LEVOTHROID) 50 MCG tablet Take 1 tablet (50 mcg total) by mouth daily. 04/15/14   Altha HarmMichelle A Matthews, MD  Naloxone HCl (NARCAN) 4 MG/0.1ML LIQD Place 4 mg into the nose once. May repeat q3-5 minutes if sx recur 01/04/15   Altha HarmMichelle A Matthews, MD  oxyCODONE-acetaminophen (PERCOCET/ROXICET) 5-325 MG per tablet Take 1 tablet by mouth every 6 (six) hours as needed for moderate pain. 01/03/15   Altha HarmMichelle A Matthews, MD  sildenafil (VIAGRA) 100 MG tablet Take 1 tablet (100 mg total) by mouth daily as needed for erectile dysfunction. Patient not taking: Reported on 01/03/2015 03/10/14   Altha HarmMichelle A Matthews, MD   BP 150/89 mmHg  Pulse 72  Temp(Src) 98.4  F (36.9 C) (Oral)  Resp 22  SpO2 96% Physical Exam  Constitutional: He is oriented to person, place, and time. He appears well-developed and well-nourished. No distress.  HENT:  Head: Normocephalic and atraumatic.  Neck: Normal range of motion. Neck supple.  Cardiovascular: Normal rate, regular rhythm and normal heart sounds.   No murmur heard. Pulmonary/Chest: Effort normal. No respiratory distress. He has wheezes.  There are slight bilateral expiratory wheezes present.  Abdominal: Soft. Bowel sounds are normal. He exhibits no distension. There is  no tenderness.  Musculoskeletal: Normal range of motion. He exhibits no edema.  Neurological: He is alert and oriented to person, place, and time.  Skin: Skin is warm and dry. He is not diaphoretic.  Nursing note and vitals reviewed.   ED Course  Procedures (including critical care time) Labs Review Labs Reviewed - No data to display  Imaging Review No results found.   EKG Interpretation   Date/Time:  Sunday January 30 2015 06:48:14 EDT Ventricular Rate:  63 PR Interval:  161 QRS Duration: 106 QT Interval:  372 QTC Calculation: 381 R Axis:   82 Text Interpretation:  Sinus rhythm Ventricular premature complex  Nonspecific T abnormalities, diffuse leads PVCs not seen previously  Confirmed by MOLPUS  MD, Jonny Ruiz ([id number]) on 01/30/2015 6:55:18 AM      MDM   Final diagnoses:  None    Patient's wheezing is markedly improved after one breathing treatment. Oxygen saturations are adequate. He has no fever or chills or productive cough and it do not feel as though chest x-ray is indicated. He will be given IM Solu-Medrol and discharged with prednisone and continued use of his inhaler. He has no chest pain and EKG is unchanged from prior studies. I do not feel as though this is cardiac and have no reason for obtaining further studies into this.    Geoffery Lyons, MD 01/30/15 [phone]

## 2015-01-30 NOTE — ED Notes (Signed)
Pt reports asthma exacerbation starting around 0200 this morning. Thinks he came into contact with dust. Attempted using his inhaler around 0300 with no alleviation. Lung sounds diminished bilaterally. No wheezing heard at this time. Unable to speak full/clear sentences. Voice is hoarse. No other c/c.

## 2015-01-30 NOTE — Discharge Instructions (Signed)
Prednisone as prescribed.  Continue albuterol inhaler as needed for wheezing.  Return to the emergency department if your symptoms significantly worsen or change.   Asthma Asthma is a recurring condition in which the airways tighten and narrow. Asthma can make it difficult to breathe. It can cause coughing, wheezing, and shortness of breath. Asthma episodes, also called asthma attacks, range from minor to life-threatening. Asthma cannot be cured, but medicines and lifestyle changes can help control it. CAUSES Asthma is believed to be caused by inherited (genetic) and environmental factors, but its exact cause is unknown. Asthma may be triggered by allergens, lung infections, or irritants in the air. Asthma triggers are different for each person. Common triggers include:   Animal dander.  Dust mites.  Cockroaches.  Pollen from trees or grass.  Mold.  Smoke.  Air pollutants such as dust, household cleaners, hair sprays, aerosol sprays, paint fumes, strong chemicals, or strong odors.  Cold air, weather changes, and winds (which increase molds and pollens in the air).  Strong emotional expressions such as crying or laughing hard.  Stress.  Certain medicines (such as aspirin) or types of drugs (such as beta-blockers).  Sulfites in foods and drinks. Foods and drinks that may contain sulfites include dried fruit, potato chips, and sparkling grape juice.  Infections or inflammatory conditions such as the flu, a cold, or an inflammation of the nasal membranes (rhinitis).  Gastroesophageal reflux disease (GERD).  Exercise or strenuous activity. SYMPTOMS Symptoms may occur immediately after asthma is triggered or many hours later. Symptoms include:  Wheezing.  Excessive nighttime or early morning coughing.  Frequent or severe coughing with a common cold.  Chest tightness.  Shortness of breath. DIAGNOSIS  The diagnosis of asthma is made by a review of your medical history  and a physical exam. Tests may also be performed. These may include:  Lung function studies. These tests show how much air you breathe in and out.  Allergy tests.  Imaging tests such as X-rays. TREATMENT  Asthma cannot be cured, but it can usually be controlled. Treatment involves identifying and avoiding your asthma triggers. It also involves medicines. There are 2 classes of medicine used for asthma treatment:   Controller medicines. These prevent asthma symptoms from occurring. They are usually taken every day.  Reliever or rescue medicines. These quickly relieve asthma symptoms. They are used as needed and provide short-term relief. Your health care provider will help you create an asthma action plan. An asthma action plan is a written plan for managing and treating your asthma attacks. It includes a list of your asthma triggers and how they may be avoided. It also includes information on when medicines should be taken and when their dosage should be changed. An action plan may also involve the use of a device called a peak flow meter. A peak flow meter measures how well the lungs are working. It helps you monitor your condition. HOME CARE INSTRUCTIONS   Take medicines only as directed by your health care provider. Speak with your health care provider if you have questions about how or when to take the medicines.  Use a peak flow meter as directed by your health care provider. Record and keep track of readings.  Understand and use the action plan to help minimize or stop an asthma attack without needing to seek medical care.  Control your home environment in the following ways to help prevent asthma attacks:  Do not smoke. Avoid being exposed to secondhand smoke.  Change your heating and air conditioning filter regularly.  Limit your use of fireplaces and wood stoves.  Get rid of pests (such as roaches and mice) and their droppings.  Throw away plants if you see mold on  them.  Clean your floors and dust regularly. Use unscented cleaning products.  Try to have someone else vacuum for you regularly. Stay out of rooms while they are being vacuumed and for a short while afterward. If you vacuum, use a dust mask from a hardware store, a double-layered or microfilter vacuum cleaner bag, or a vacuum cleaner with a HEPA filter.  Replace carpet with wood, tile, or vinyl flooring. Carpet can trap dander and dust.  Use allergy-proof pillows, mattress covers, and box spring covers.  Wash bed sheets and blankets every week in hot water and dry them in a dryer.  Use blankets that are made of polyester or cotton.  Clean bathrooms and kitchens with bleach. If possible, have someone repaint the walls in these rooms with mold-resistant paint. Keep out of the rooms that are being cleaned and painted.  Wash hands frequently. SEEK MEDICAL CARE IF:   You have wheezing, shortness of breath, or a cough even if taking medicine to prevent attacks.  The colored mucus you cough up (sputum) is thicker than usual.  Your sputum changes from clear or white to yellow, green, gray, or bloody.  You have any problems that may be related to the medicines you are taking (such as a rash, itching, swelling, or trouble breathing).  You are using a reliever medicine more than 2-3 times per week.  Your peak flow is still at 50-79% of your personal best after following your action plan for 1 hour.  You have a fever. SEEK IMMEDIATE MEDICAL CARE IF:   You seem to be getting worse and are unresponsive to treatment during an asthma attack.  You are short of breath even at rest.  You get short of breath when doing very little physical activity.  You have difficulty eating, drinking, or talking due to asthma symptoms.  You develop chest pain.  You develop a fast heartbeat.  You have a bluish color to your lips or fingernails.  You are light-headed, dizzy, or faint.  Your peak flow  is less than 50% of your personal best. MAKE SURE YOU:   Understand these instructions.  Will watch your condition.  Will get help right away if you are not doing well or get worse. Document Released: 07/16/2005 Document Revised: 11/30/2013 Document Reviewed: 02/12/2013 [facility] Patient Information 2015 [place], [place]. This information is not intended to replace advice given to you by your health care provider. Make sure you discuss any questions you have with your health care provider.

## 2015-02-01 ENCOUNTER — Ambulatory Visit (HOSPITAL_COMMUNITY): Payer: Self-pay | Admitting: Psychiatry

## 2015-02-01 ENCOUNTER — Other Ambulatory Visit: Payer: Self-pay | Admitting: Family Medicine

## 2015-02-01 DIAGNOSIS — M549 Dorsalgia, unspecified: Principal | ICD-10-CM

## 2015-02-01 DIAGNOSIS — G8929 Other chronic pain: Secondary | ICD-10-CM

## 2015-02-01 MED ORDER — OXYCODONE-ACETAMINOPHEN 5-325 MG PO TABS
1.0000 | ORAL_TABLET | Freq: Four times a day (QID) | ORAL | Status: DC | PRN
Start: 2015-02-01 — End: 2015-02-15

## 2015-02-08 ENCOUNTER — Other Ambulatory Visit: Payer: Self-pay | Admitting: Physician Assistant

## 2015-02-08 ENCOUNTER — Ambulatory Visit
Admission: RE | Admit: 2015-02-08 | Discharge: 2015-02-08 | Disposition: A | Discharge status: Home / Self Care | Payer: 59 | Source: Ambulatory Visit | Attending: Physician Assistant | Admitting: Physician Assistant

## 2015-02-08 DIAGNOSIS — R1031 Right lower quadrant pain: Secondary | ICD-10-CM

## 2015-02-09 ENCOUNTER — Other Ambulatory Visit: Payer: Self-pay | Admitting: Physician Assistant

## 2015-02-09 ENCOUNTER — Other Ambulatory Visit: Payer: Self-pay

## 2015-02-09 DIAGNOSIS — R109 Unspecified abdominal pain: Secondary | ICD-10-CM

## 2015-02-10 ENCOUNTER — Encounter: Payer: Self-pay | Admitting: Family Medicine

## 2015-02-10 ENCOUNTER — Ambulatory Visit (INDEPENDENT_AMBULATORY_CARE_PROVIDER_SITE_OTHER): Payer: 59 | Admitting: Family Medicine

## 2015-02-10 ENCOUNTER — Ambulatory Visit
Admission: RE | Admit: 2015-02-10 | Discharge: 2015-02-10 | Disposition: A | Discharge status: Home / Self Care | Payer: 59 | Source: Ambulatory Visit | Attending: Physician Assistant | Admitting: Physician Assistant

## 2015-02-10 VITALS — BP 159/78 | HR 55 | Temp 98.1°F | Resp 16 | Ht 67.0 in | Wt 237.0 lb

## 2015-02-10 DIAGNOSIS — I10 Essential (primary) hypertension: Secondary | ICD-10-CM

## 2015-02-10 DIAGNOSIS — R109 Unspecified abdominal pain: Secondary | ICD-10-CM

## 2015-02-10 DIAGNOSIS — E039 Hypothyroidism, unspecified: Secondary | ICD-10-CM | POA: Diagnosis not present

## 2015-02-10 LAB — COMPLETE METABOLIC PANEL WITH GFR
ALBUMIN: 3.8 g/dL (ref 3.5–5.2)
ALT: 25 U/L (ref 0–53)
AST: 19 U/L (ref 0–37)
Alkaline Phosphatase: 57 U/L (ref 39–117)
BUN: 21 mg/dL (ref 6–23)
CALCIUM: 8.8 mg/dL (ref 8.4–10.5)
CO2: 24 meq/L (ref 19–32)
Chloride: 103 mEq/L (ref 96–112)
Creat: 1.47 mg/dL — ABNORMAL HIGH (ref 0.50–1.35)
GFR, Est African American: 64 mL/min
GFR, Est Non African American: 55 mL/min — ABNORMAL LOW
GLUCOSE: 97 mg/dL (ref 70–99)
POTASSIUM: 3.9 meq/L (ref 3.5–5.3)
Sodium: 139 mEq/L (ref 135–145)
Total Bilirubin: 0.6 mg/dL (ref 0.2–1.2)
Total Protein: 6.8 g/dL (ref 6.0–8.3)

## 2015-02-10 LAB — TSH: TSH: 1.123 u[IU]/mL (ref 0.350–4.500)

## 2015-02-10 LAB — CBC WITH DIFFERENTIAL/PLATELET
Basophils Absolute: 0 10*3/uL (ref 0.0–0.1)
Basophils Relative: 0 % (ref 0–1)
EOS ABS: 0.1 10*3/uL (ref 0.0–0.7)
EOS PCT: 1 % (ref 0–5)
HCT: 37 % — ABNORMAL LOW (ref 39.0–52.0)
Hemoglobin: 11.9 g/dL — ABNORMAL LOW (ref 13.0–17.0)
LYMPHS PCT: 26 % (ref 12–46)
Lymphs Abs: 2 10*3/uL (ref 0.7–4.0)
MCH: 25.4 pg — ABNORMAL LOW (ref 26.0–34.0)
MCHC: 32.2 g/dL (ref 30.0–36.0)
MCV: 78.9 fL (ref 78.0–100.0)
MONO ABS: 0.6 10*3/uL (ref 0.1–1.0)
Monocytes Relative: 8 % (ref 3–12)
NEUTROS PCT: 65 % (ref 43–77)
Neutro Abs: 5 10*3/uL (ref 1.7–7.7)
Platelets: 345 10*3/uL (ref 150–400)
RBC: 4.69 MIL/uL (ref 4.22–5.81)
RDW: 18 % — ABNORMAL HIGH (ref 11.5–15.5)
WBC: 7.7 10*3/uL (ref 4.0–10.5)

## 2015-02-10 LAB — LIPID PANEL
CHOL/HDL RATIO: 3.5 ratio
Cholesterol: 126 mg/dL (ref 0–200)
HDL: 36 mg/dL — ABNORMAL LOW (ref >=40)
LDL Cholesterol: 60 mg/dL (ref 0–99)
Triglycerides: 152 mg/dL — ABNORMAL HIGH (ref <150)
VLDL: 30 mg/dL (ref 0–40)

## 2015-02-10 MED ORDER — AMLODIPINE BESYLATE-VALSARTAN 5-320 MG PO TABS: 1.0000 | ORAL_TABLET | Freq: Every day | ORAL | Status: DC

## 2015-02-10 MED ORDER — DOXAZOSIN MESYLATE 4 MG PO TABS: 4.0000 mg | ORAL_TABLET | Freq: Every day | ORAL | Status: DC

## 2015-02-10 NOTE — Patient Instructions (Signed)
Continue blood pressure at current dosage and remember to take daily. Be sure to take the morning of your next visit. Follow-up with GI about Kidney Stones. Let us know if you need a referral to urology from us. Keep appointment with pain clinic. Continue to keep narcotics in a safe place and locked. Follow-up her in 3 months and as needed

## 2015-02-10 NOTE — Progress Notes (Signed)
Patient ID: Nicholas Mcintosh, male   DOB: Mar 26, 1966, 49 y.o.   MRN: [id number]   Nicholas Mcintosh, is a 49 y.o. male  [id number]  [id number]  DOB - Feb 25, 1966  CC:  Chief Complaint  Patient presents with  . Hypertension  . Back Pain    kidney stones       HPI: Nicholas Mcintosh is a 49 y.o. male follow-up hypertension, hypothyroidism, back pain.  He has not had his BP medication today. He reports his systolic pressures are between 130-140 at home. He was seen this morning at [facility] for abd pain. A CT showed multiple bilateral kidney stones. He anticipated they will refer him to urology. He reports that his pain at this time is 10/10. He has chronic back pain and has been referred to pain clinic and has a appointment coming up. He should have enough pain medication to last until his appointment. He reports that he was started on atorvastatin by Dr. Ashley Royalty. He does not feel he needs this and has discontinued. His last LDL was at goal. His trigylcerides were 175.   Allergies  Allergen Reactions  . Molds & Smuts     Asthma attacks around molds   Past Medical History  Diagnosis Date  . Asthma   . Hypertension   . Hypothyroidism   . New-onset angina 07/12/2011  . Back pain     Secondary to 2-story fall 2006   Current Outpatient Prescriptions on File Prior to Visit  Medication Sig Dispense Refill  . albuterol (PROVENTIL HFA;VENTOLIN HFA) 108 (90 BASE) MCG/ACT inhaler Inhale 2 puffs into the lungs every 4 (four) hours as needed for wheezing or shortness of breath. (Patient not taking: Reported on 11/01/2014) 1 Inhaler 3  . amLODipine-valsartan (EXFORGE) 5-320 MG per tablet TAKE 1 TABLET BY MOUTH DAILY. 30 tablet 2  . atorvastatin (LIPITOR) 40 MG tablet Take 1 tablet (40 mg total) by mouth daily. (Patient not taking: Reported on 02/10/2015) 90 tablet 3  . cholecalciferol (VITAMIN D) 1000 UNITS tablet Take 2,000 Units by mouth daily.    Marland Kitchen doxazosin (CARDURA) 4 MG tablet TAKE 1  TABLET BY MOUTH ONCE DAILY 30 tablet 3  . DULoxetine (CYMBALTA) 60 MG capsule Take 1 capsule (60 mg total) by mouth daily. 90 capsule 0  . levothyroxine (SYNTHROID, LEVOTHROID) 50 MCG tablet Take 1 tablet (50 mcg total) by mouth daily. 90 tablet 3  . Naloxone HCl (NARCAN) 4 MG/0.1ML LIQD Place 4 mg into the nose once. May repeat q3-5 minutes if sx recur 2 each 1  . oxyCODONE-acetaminophen (PERCOCET/ROXICET) 5-325 MG per tablet Take 1 tablet by mouth every 6 (six) hours as needed for moderate pain. 120 tablet 0  . predniSONE (DELTASONE) 10 MG tablet Take 2 tablets (20 mg total) by mouth 2 (two) times daily. (Patient not taking: Reported on 02/10/2015) 12 tablet 0  . sildenafil (VIAGRA) 100 MG tablet Take 1 tablet (100 mg total) by mouth daily as needed for erectile dysfunction. (Patient not taking: Reported on 01/03/2015) 10 tablet 0   No current facility-administered medications on file prior to visit.   Family History  Problem Relation Age of Onset  . Hypertension Mother   . Cancer Maternal Aunt     Breast   History   Social History  . Marital Status: Married    Spouse Name: N/A  . Number of Children: N/A  . Years of Education: N/A   Occupational History  . Not on file.   Social History Main  Topics  . Smoking status: Never Smoker   . Smokeless tobacco: Not on file  . Alcohol Use: No  . Drug Use: No  . Sexual Activity: No   Other Topics Concern  . Not on file   Social History Narrative    Review of Systems: Constitutional: Negative for fever, chills, appetite change, weight loss,  fatigue. HENT: Negative for ear pain, ear discharge.nose bleeds Eyes: Negative for pain, discharge, redness, itching and visual disturbance. Wears glasses Neck: Negative for pain, stiffness Respiratory: Negative for cough, shortness of breath,   Cardiovascular: Negative for chest pain, palpitations and leg swelling. Gastrointestinal: Negative for abdominal distention, nausea, vomiting, diarrhea,  constipations. Positive for abd pain. Was seen this morning and diagnosed with kidney stones at [facility]. Genitourinary: Negative for dysuria, urgency, frequency, hematuria, flank pain,  Musculoskeletal: Positive for mid to low pack pain. Negative for other  joint pain, joint  swelling, arthralgia and gait problem.Negative for weakness.Negative for CVA tenderness. Neurological: Negative for dizziness, tremors, seizures, syncope,   light-headedness, numbness and headaches.  Hematological: Negative for easy bruising or bleeding Psychiatric/Behavioral: Negative for depression, anxiety, decreased concentration, confusion    Objective:   Filed Vitals:   02/10/15 0934  BP: 159/78  Pulse: 55  Temp: 98.1 F (36.7 C)  Resp: 16    Physical Exam: Constitutional: Patient appears well-developed and well-nourished. Is in mild/moderate distress with presumed kidney stone pain. HENT: Normocephalic, atraumatic, External right and left ear normal. Oropharynx is clear and moist.  Eyes: Conjunctivae and EOM are normal. PERRLA, no scleral icterus. Neck: Normal ROM. Neck supple. No lymphadenopathy, No thyromegaly. CVS: RRR, S1/S2 +, no murmurs, no gallops, no rubs Pulmonary: Effort and breath sounds normal, no stridor, rhonchi, wheezes, rales.  Abdominal: Soft. Normoactive BS,, no distension, rebound or guarding. Positive for generalized tenderness. Musculoskeletal: Normal range of motion. No edema and no tenderness.  Neuro: Alert.Normal muscle tone coordination. Non-focal Skin: Skin is warm and dry. No rash noted. Not diaphoretic. No erythema. No pallor. Psychiatric: Normal mood and affect. Behavior, judgment, thought content normal.  Lab Results  Component Value Date   WBC 4.4 04/28/2014   HGB 12.3* 04/28/2014   HCT 37.8* 04/28/2014   MCV 80.3 04/28/2014   PLT 228 04/28/2014   Lab Results  Component Value Date   CREATININE 0.91 08/12/2014   BUN 12 08/12/2014   NA 139 08/12/2014   K 4.1  08/12/2014   CL 105 08/12/2014   CO2 26 08/12/2014    Lab Results  Component Value Date   HGBA1C 5.6 04/15/2014   Lipid Panel     Component Value Date/Time   CHOL 131 04/15/2014 1447   TRIG 170* 04/15/2014 1447   HDL 38* 04/15/2014 1447   CHOLHDL 3.4 04/15/2014 1447   VLDL 34 04/15/2014 1447   LDLCALC 59 04/15/2014 1447       Assessment and plan:   Hypertension - Have refilled his Exforge 5/320  and doxazosin 4 mg. -Have reminded him of need to watch salt - Have recommented weight loss with reduced calorie diet -Have recommened walking 5/7 days for 30 minutes if possible. -Cmet with gfr, cbc, lipid panel, will notify of abnormal results.  Chronic pain -Has been referred to pain clinic and has an appointment. He should have sufficient narcotic pain medication to last until then  Acute pain, presumed to be related to kidney stones - follow-up with GI about referral to urology. He will let us know if we need to be involved.  Hypothyroidism on levothyroxine 50 mcg daily. -TSH today -will inform if he needs to change dosage.   Follow-up -3 months and prn.  The patient was given clear instructions to go to ER or return to medical center if symptoms don't improve, worsen or new problems develop. The patient verbalized understanding. The patient was told to call to get lab results if they haven't heard anything in the next week.       Henrietta Hoover, MSN, FNP-BC   02/10/2015, 9:57 AM

## 2015-02-11 ENCOUNTER — Other Ambulatory Visit: Payer: Self-pay | Admitting: Urology

## 2015-02-11 ENCOUNTER — Other Ambulatory Visit: Payer: Self-pay | Admitting: Family Medicine

## 2015-02-11 ENCOUNTER — Telehealth: Payer: Self-pay | Admitting: *Deleted

## 2015-02-11 ENCOUNTER — Other Ambulatory Visit: Payer: Self-pay

## 2015-02-11 MED ORDER — FOLIC ACID 1 MG PO TABS: 1.0000 mg | ORAL_TABLET | Freq: Every day | ORAL | Status: DC

## 2015-02-11 NOTE — Telephone Encounter (Signed)
Pt called and wanted to make you aware that the Pain clinic called him yesterday and cancelled his appt on Aug 11th due to Dr conflict. They R/S the appt for Aug 30th. Thanks

## 2015-02-11 NOTE — Progress Notes (Signed)
Please release orders to sign and held surgery 02-15-15 pre op 02-14-15 Thanks

## 2015-02-14 ENCOUNTER — Encounter (HOSPITAL_COMMUNITY): Payer: Self-pay

## 2015-02-14 ENCOUNTER — Encounter (HOSPITAL_COMMUNITY)
Admission: RE | Admit: 2015-02-14 | Discharge: 2015-02-14 | Disposition: A | Discharge status: Home / Self Care | Payer: 59 | Source: Ambulatory Visit | Attending: Urology | Admitting: Urology

## 2015-02-14 ENCOUNTER — Other Ambulatory Visit: Payer: Self-pay

## 2015-02-14 DIAGNOSIS — Z79891 Long term (current) use of opiate analgesic: Secondary | ICD-10-CM | POA: Diagnosis not present

## 2015-02-14 DIAGNOSIS — Z791 Long term (current) use of non-steroidal anti-inflammatories (NSAID): Secondary | ICD-10-CM | POA: Diagnosis not present

## 2015-02-14 DIAGNOSIS — I10 Essential (primary) hypertension: Secondary | ICD-10-CM | POA: Diagnosis not present

## 2015-02-14 DIAGNOSIS — Z79899 Other long term (current) drug therapy: Secondary | ICD-10-CM | POA: Diagnosis not present

## 2015-02-14 DIAGNOSIS — J45909 Unspecified asthma, uncomplicated: Secondary | ICD-10-CM | POA: Diagnosis not present

## 2015-02-14 DIAGNOSIS — E039 Hypothyroidism, unspecified: Secondary | ICD-10-CM | POA: Diagnosis not present

## 2015-02-14 DIAGNOSIS — N201 Calculus of ureter: Secondary | ICD-10-CM | POA: Diagnosis present

## 2015-02-14 DIAGNOSIS — N132 Hydronephrosis with renal and ureteral calculous obstruction: Secondary | ICD-10-CM | POA: Diagnosis not present

## 2015-02-14 NOTE — Progress Notes (Signed)
Cbc with dif,cmet, tsh 02-10-15 epic, chest xray 05-01-14 epic ekg 01-30-15 epic

## 2015-02-14 NOTE — Patient Instructions (Addendum)
Nicholas Mcintosh  02/14/2015    Your procedure is scheduled on: Tuesday July 19th, 2016  Report to [facility]  Entrance take [place]  elevators to 3rd floor to  Short Stay Center at 300 pm  Call this number if you have problems the morning of surgery [phone]   Remember: ONLY 1 PERSON MAY GO WITH YOU TO SHORT STAY TO GET  READY MORNING OF YOUR SURGERY.  Do not eat food  :After Midnight tonight, clear liquids midnight until 1100 am  nothing by mouth  after 1100 am day tomorrow.        Take these medicines the morning of surgery with A SIP OF WATER: Oxycodone if needed,  Albuterol Inhaler if needed and bring inhaler with you                              You may not have any metal on your body including hair pins and              piercings  Do not wear jewelry, make-up, lotions, powders or perfumes, deodorant             Do not wear nail polish.  Do not shave  48 hours prior to surgery.              Men may shave face and neck.   Do not bring valuables to the hospital. [facility] IS NOT             RESPONSIBLE   FOR VALUABLES.  Contacts, dentures or bridgework may not be worn into surgery.  Leave suitcase in the car. After surgery it may be brought to your room.     Patients discharged the day of surgery will not be allowed to drive home.  Name and phone number of your driver:Sophia Schifano wife cell [phone]  Special Instructions: N/A              Please read over the following fact sheets you were given: _____________________________________________________________________                CLEAR LIQUID DIET   Foods Allowed                                                                     Foods Excluded  Coffee and tea, regular and decaf                             liquids that you cannot  Plain Jell-O in any flavor                                             see through such as: Fruit ices (not with fruit pulp)                                      milk, soups, orange  juice  Iced Popsicles                                    All solid food Carbonated beverages, regular and diet                                    Cranberry, grape and apple juices Sports drinks like Gatorade Lightly seasoned clear broth or consume(fat free) Sugar, honey syrup  Sample Menu Breakfast                                Lunch                                     Supper Cranberry juice                    Beef broth                            Chicken broth Jell-O                                     Grape juice                           Apple juice Coffee or tea                        Jell-O                                      Popsicle                                                Coffee or tea                        Coffee or tea  _____________________________________________________________________  [facility] - Preparing for Surgery Before surgery, you can play an important role.  Because skin is not sterile, your skin needs to be as free of germs as possible.  You can reduce the number of germs on your skin by washing with CHG (chlorahexidine gluconate) soap before surgery.  CHG is an antiseptic cleaner which kills germs and bonds with the skin to continue killing germs even after washing. Please DO NOT use if you have an allergy to CHG or antibacterial soaps.  If your skin becomes reddened/irritated stop using the CHG and inform your nurse when you arrive at Short Stay. Do not shave (including legs and underarms) for at least 48 hours prior to the first CHG shower.  You may shave your face/neck. Please follow these instructions carefully:  1.  Shower with CHG Soap the night before surgery and the  morning of Surgery.  2.  If you choose to wash your hair, wash  your hair first as usual with your  normal  shampoo.  3.  After you shampoo, rinse your hair and body thoroughly to remove the  shampoo.                           4.  Use CHG as you would any other  liquid soap.  You can apply chg directly  to the skin and wash                       Gently with a scrungie or clean washcloth.  5.  Apply the CHG Soap to your body ONLY FROM THE NECK DOWN.   Do not use on face/ open                           Wound or open sores. Avoid contact with eyes, ears mouth and genitals (private parts).                       Wash face,  Genitals (private parts) with your normal soap.             6.  Wash thoroughly, paying special attention to the area where your surgery  will be performed.  7.  Thoroughly rinse your body with warm water from the neck down.  8.  DO NOT shower/wash with your normal soap after using and rinsing off  the CHG Soap.                9.  Pat yourself dry with a clean towel.            10.  Wear clean pajamas.            11.  Place clean sheets on your bed the night of your first shower and do not  sleep with pets. Day of Surgery : Do not apply any lotions/deodorants the morning of surgery.  Please wear clean clothes to the hospital/surgery center.  FAILURE TO FOLLOW THESE INSTRUCTIONS MAY RESULT IN THE CANCELLATION OF YOUR SURGERY PATIENT SIGNATURE_________________________________  NURSE SIGNATURE__________________________________  ________________________________________________________________________ .

## 2015-02-14 NOTE — H&P (Signed)
Reason For Visit 2 Lt ureteral stones   Active Problems Problems  1. Left ureteral stone (N20.1)  History of Present Illness    49 YO male returns today because he has been having bilateral flank pain x 1 mo . CT on 02/10/15 shows two Lt mid ureteral stones with moderate hydronephrosis. 1. 4.48mm and 2. 1-2 mm.     He was previously seen by Jimmey Ralph, NP on 07/29/14 for painless gross hematuria. He takes Meloxicam QHS for pain. Developed painless gross hematuria throughout stream with clots about 10 days ago and it lasted up till 2 days ago. Has remote hx of nephrolithiasis with last stone >10 yrs ago.     Hx of low Testosterone levels & feeling fatigue. He has purchased a product at the [employer]. wt: 228-230. Ht 5'6.5". C/o bilateral breast development. ( began after taking Androxy orally). He has been off hormone for > 1 year. Hx of normal testes.    Previous hx of microhematuria and epididymitis.   Past Medical History Problems  1. History of A Fall From A Ladder 2. History of Asthma (J45.909) 3. History of hypertension (Z86.79) 4. History of kidney stones (Z87.442) 5. History of thyroid disease (Z86.39) 6. 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 History of Surgery Spermatic Cord Excision Of Varicocele  Current Meds 1. Exforge TABS;  Therapy: (FFMBWGYK:[id number]) to Recorded 2. Levothyroxine Sodium TABS;  Therapy: (BLTJQZES:[id number]) to Recorded 3. Meloxicam TABS;  Therapy: (UQJFHLKT:[id number]) to Recorded 4. Oxycodone-Acetaminophen 5-325 MG Oral Tablet;  Therapy: (Recorded:31Dec2015) to Recorded 5. Soma TABS;  Therapy: (Recorded:11May2009) to Recorded 6. Viagra 100 MG Oral Tablet; USE AS DIRECTED;  Therapy: [id number] to (Last Rx:08Jan2013)  Requested for: [id number] Ordered  Allergies Medication  1. No Known Drug Allergies  Family History Problems  1. Family history of Family Health Status - Mother's Age    90 2. Family history of Family Health Status Number Of Children   3 sons, 1 daughter 3. No pertinent family history : Mother  Social History Problems  1. Denied: History of Alcohol Use 2. Denied: History of Caffeine Use 3. Marital History - Currently Married 4. Never A Smoker 5. Occupation:   unemployed 85. Denied: History of Tobacco Use  Review of Systems Genitourinary, constitutional, skin, eye, otolaryngeal, hematologic/lymphatic, cardiovascular, pulmonary, endocrine, musculoskeletal, gastrointestinal, neurological and psychiatric system(s) were reviewed and pertinent findings if present are noted and are otherwise negative.  Genitourinary: hematuria.    Physical Exam Constitutional:1  Well nourished1  and well developed1  . No acute distress1 .  ENT:1 . The ears and nose are normal in appearance1 .  Neck:1  The appearance of the neck is normal1  and no neck mass is present1 .  Pulmonary:1  No respiratory distress1  and normal respiratory rhythm and effort1 .  Cardiovascular:1  Heart rate and rhythm are normal1  . No peripheral edema.1 .  Abdomen: The abdomen is soft and nontender1  No masses are palpated1  No CVA tenderness1 . No hernias are palpable1  No hepatosplenomegaly noted1   Rectal: Rectal exam demonstrates1  normal sphincter tone1 , no tenderness1  and no masses1 . The prostate1  has no nodularity1  and is not tender1 . The left seminal vesicle is1  nonpalpable1 . The right seminal vesicle is1  nonpalpable1 . The perineum is normal on inspection1 .  Genitourinary: Examination of the penis demonstrates1  no discharge1 , no masses1 , no lesions1  and a normal  meatus1 . The scrotum is1  without lesions1 . The right epididymis is1  palpably normal1  and non-tender1 . The left epididymis is1  palpably normal1  and non-tender1 . The right testis is1  non-tender1  and without masses1 . The left testis is1  non-tender1  and without masses1 .  Lymphatics: The  femoral1  and   inguinal1  nodes are not enlarged or tender1 .  Skin:1  Normal skin turgor1 , no visible rash1  and no visible skin lesions1 .  Neuro/Psych:1 . Mood and affect are appropriate1 .     1 Amended By: Carolan Clines; Feb 11 2015 12:27 PM EST  Assessment  Left ureteral stones: 2 stones, 14m distal and 236mabove it. Pt using no colas, and probably has genetic predisposition ( only family member).1     1 Amended By: SiCarolan ClinesJul 15 2016 12:25 PM EST  Plan  Toradol, flomax, and schedule surgery for Tuesday.1     1 Amended By: SiCarolan ClinesJul 15 2016 12:25 PM EST  Discussion/Summary  Toradol and 1  1,2  strainer 2  and Flomax.1       1 Amended By: SiCarolan ClinesJul 15 2016 12:25 PM EST  2 Amended By: SiCarolan ClinesJul 15 2016 12:31 PM EST  Signatures Electronically signed by : SiCarolan ClinesM.D.; Feb 11 2015 12:34PM EST

## 2015-02-15 ENCOUNTER — Encounter (HOSPITAL_COMMUNITY)
Admission: RE | Disposition: A | Discharge status: Home / Self Care | Payer: Self-pay | Source: Ambulatory Visit | Attending: Urology

## 2015-02-15 ENCOUNTER — Encounter (HOSPITAL_COMMUNITY): Payer: Self-pay | Admitting: *Deleted

## 2015-02-15 ENCOUNTER — Ambulatory Visit (HOSPITAL_COMMUNITY)
Admission: RE | Admit: 2015-02-15 | Discharge: 2015-02-15 | Disposition: A | Discharge status: Home / Self Care | Payer: 59 | Source: Ambulatory Visit | Attending: Urology | Admitting: Urology

## 2015-02-15 ENCOUNTER — Ambulatory Visit (HOSPITAL_COMMUNITY): Payer: 59 | Admitting: Certified Registered Nurse Anesthetist

## 2015-02-15 DIAGNOSIS — N132 Hydronephrosis with renal and ureteral calculous obstruction: Secondary | ICD-10-CM | POA: Insufficient documentation

## 2015-02-15 DIAGNOSIS — E039 Hypothyroidism, unspecified: Secondary | ICD-10-CM | POA: Insufficient documentation

## 2015-02-15 DIAGNOSIS — Z79899 Other long term (current) drug therapy: Secondary | ICD-10-CM | POA: Insufficient documentation

## 2015-02-15 DIAGNOSIS — J45909 Unspecified asthma, uncomplicated: Secondary | ICD-10-CM | POA: Insufficient documentation

## 2015-02-15 DIAGNOSIS — Z79891 Long term (current) use of opiate analgesic: Secondary | ICD-10-CM | POA: Insufficient documentation

## 2015-02-15 DIAGNOSIS — N200 Calculus of kidney: Secondary | ICD-10-CM

## 2015-02-15 DIAGNOSIS — Z791 Long term (current) use of non-steroidal anti-inflammatories (NSAID): Secondary | ICD-10-CM | POA: Insufficient documentation

## 2015-02-15 DIAGNOSIS — I10 Essential (primary) hypertension: Secondary | ICD-10-CM | POA: Insufficient documentation

## 2015-02-15 HISTORY — DX: Other specified postprocedural states: R11.2

## 2015-02-15 HISTORY — PX: CYSTOSCOPY WITH HOLMIUM LASER LITHOTRIPSY: SHX6639

## 2015-02-15 HISTORY — DX: Other complications of anesthesia, initial encounter: T88.59XA

## 2015-02-15 HISTORY — PX: CYSTOSCOPY/RETROGRADE/URETEROSCOPY/STONE EXTRACTION WITH BASKET: SHX5317

## 2015-02-15 HISTORY — PX: CYSTOSCOPY WITH STENT PLACEMENT: SHX5790

## 2015-02-15 HISTORY — DX: Adverse effect of unspecified anesthetic, initial encounter: T41.45XA

## 2015-02-15 HISTORY — DX: Other specified postprocedural states: Z98.890

## 2015-02-15 SURGERY — CYSTOSCOPY, WITH CALCULUS REMOVAL USING BASKET
Anesthesia: General | Site: Ureter | Laterality: Left

## 2015-02-15 MED ORDER — DEXAMETHASONE SODIUM PHOSPHATE 10 MG/ML IJ SOLN
INTRAMUSCULAR | Status: DC | PRN
  Administered 2015-02-15: 10 mg via INTRAVENOUS

## 2015-02-15 MED ORDER — OXYBUTYNIN CHLORIDE 5 MG PO TABS
5.0000 mg | ORAL_TABLET | Freq: Three times a day (TID) | ORAL | Status: DC | PRN
Start: 2015-02-15 — End: 2017-01-14

## 2015-02-15 MED ORDER — IOHEXOL 300 MG/ML  SOLN
INTRAMUSCULAR | Status: DC | PRN
  Administered 2015-02-15: 25 mL
  Administered 2015-02-15: 25 mL via URETHRAL

## 2015-02-15 MED ORDER — LIDOCAINE HCL (CARDIAC) 20 MG/ML IV SOLN
INTRAVENOUS | Status: AC
  Filled 2015-02-15: qty 5

## 2015-02-15 MED ORDER — LACTATED RINGERS IV SOLN
INTRAVENOUS | Status: DC
  Administered 2015-02-15: 1000 mL via INTRAVENOUS
  Administered 2015-02-15: 19:00:00 via INTRAVENOUS

## 2015-02-15 MED ORDER — KETOROLAC TROMETHAMINE 30 MG/ML IJ SOLN
INTRAMUSCULAR | Status: AC
  Filled 2015-02-15: qty 1

## 2015-02-15 MED ORDER — FENTANYL CITRATE (PF) 250 MCG/5ML IJ SOLN
INTRAMUSCULAR | Status: AC
  Filled 2015-02-15: qty 5

## 2015-02-15 MED ORDER — PHENAZOPYRIDINE HCL 100 MG PO TABS: 100.0000 mg | ORAL_TABLET | Freq: Three times a day (TID) | ORAL | Status: DC | PRN

## 2015-02-15 MED ORDER — LIDOCAINE HCL (CARDIAC) 20 MG/ML IV SOLN
INTRAVENOUS | Status: DC | PRN
  Administered 2015-02-15: 100 mg via INTRAVENOUS

## 2015-02-15 MED ORDER — EPHEDRINE SULFATE 50 MG/ML IJ SOLN
INTRAMUSCULAR | Status: DC | PRN
  Administered 2015-02-15: 10 mg via INTRAVENOUS

## 2015-02-15 MED ORDER — CEFAZOLIN SODIUM-DEXTROSE 2-3 GM-% IV SOLR
2.0000 g | INTRAVENOUS | Status: AC
  Administered 2015-02-15: 2 g via INTRAVENOUS

## 2015-02-15 MED ORDER — HYDROCODONE-ACETAMINOPHEN 5-325 MG PO TABS: 1.0000 | ORAL_TABLET | Freq: Four times a day (QID) | ORAL | Status: DC | PRN

## 2015-02-15 MED ORDER — LIDOCAINE HCL 2 % EX GEL
CUTANEOUS | Status: AC
  Filled 2015-02-15: qty 10

## 2015-02-15 MED ORDER — SODIUM CHLORIDE 0.9 % IR SOLN
Status: DC | PRN
  Administered 2015-02-15: 1000 mL
  Administered 2015-02-15 (×2): 3000 mL
  Administered 2015-02-15 (×2): 1000 mL

## 2015-02-15 MED ORDER — ONDANSETRON HCL 4 MG/2ML IJ SOLN
INTRAMUSCULAR | Status: DC | PRN
  Administered 2015-02-15: 4 mg via INTRAVENOUS

## 2015-02-15 MED ORDER — DOCUSATE SODIUM 100 MG PO CAPS: 100.0000 mg | ORAL_CAPSULE | Freq: Two times a day (BID) | ORAL | Status: DC

## 2015-02-15 MED ORDER — FENTANYL CITRATE (PF) 100 MCG/2ML IJ SOLN
INTRAMUSCULAR | Status: DC | PRN
  Administered 2015-02-15 (×5): 50 ug via INTRAVENOUS

## 2015-02-15 MED ORDER — PROMETHAZINE HCL 25 MG/ML IJ SOLN
6.2500 mg | INTRAMUSCULAR | Status: DC | PRN
  Administered 2015-02-15: 6.25 mg via INTRAVENOUS

## 2015-02-15 MED ORDER — TAMSULOSIN HCL 0.4 MG PO CAPS: 0.4000 mg | ORAL_CAPSULE | Freq: Every day | ORAL | Status: DC

## 2015-02-15 MED ORDER — BELLADONNA ALKALOIDS-OPIUM 16.2-60 MG RE SUPP
RECTAL | Status: AC
  Filled 2015-02-15: qty 1

## 2015-02-15 MED ORDER — ACETAMINOPHEN 10 MG/ML IV SOLN
1000.0000 mg | Freq: Once | INTRAVENOUS | Status: AC
  Administered 2015-02-15: 1000 mg via INTRAVENOUS

## 2015-02-15 MED ORDER — ACETAMINOPHEN 10 MG/ML IV SOLN
INTRAVENOUS | Status: AC
  Filled 2015-02-15: qty 100

## 2015-02-15 MED ORDER — PROPOFOL 10 MG/ML IV BOLUS
INTRAVENOUS | Status: AC
  Filled 2015-02-15: qty 20

## 2015-02-15 MED ORDER — PROPOFOL 10 MG/ML IV BOLUS
INTRAVENOUS | Status: DC | PRN
  Administered 2015-02-15: 200 mg via INTRAVENOUS

## 2015-02-15 MED ORDER — 0.9 % SODIUM CHLORIDE (POUR BTL) OPTIME
TOPICAL | Status: DC | PRN
  Administered 2015-02-15: 1000 mL

## 2015-02-15 MED ORDER — MIDAZOLAM HCL 5 MG/5ML IJ SOLN
INTRAMUSCULAR | Status: DC | PRN
  Administered 2015-02-15: 2 mg via INTRAVENOUS

## 2015-02-15 MED ORDER — KETOROLAC TROMETHAMINE 15 MG/ML IJ SOLN
INTRAMUSCULAR | Status: DC | PRN
  Administered 2015-02-15: 15 mg via INTRAVENOUS

## 2015-02-15 MED ORDER — ONDANSETRON HCL 4 MG/2ML IJ SOLN
INTRAMUSCULAR | Status: AC
  Filled 2015-02-15: qty 2

## 2015-02-15 MED ORDER — KETOROLAC TROMETHAMINE 30 MG/ML IJ SOLN: 30.0000 mg | Freq: Once | INTRAMUSCULAR | Status: DC

## 2015-02-15 MED ORDER — AMLODIPINE BESYLATE 5 MG PO TABS
5.0000 mg | ORAL_TABLET | Freq: Once | ORAL | Status: AC
  Administered 2015-02-15: 5 mg via ORAL
  Filled 2015-02-15: qty 1

## 2015-02-15 MED ORDER — BELLADONNA ALKALOIDS-OPIUM 16.2-60 MG RE SUPP
RECTAL | Status: DC | PRN
  Administered 2015-02-15: 1 via RECTAL

## 2015-02-15 MED ORDER — PHENYLEPHRINE HCL 10 MG/ML IJ SOLN
INTRAMUSCULAR | Status: DC | PRN
  Administered 2015-02-15: 40 ug via INTRAVENOUS
  Administered 2015-02-15: 80 ug via INTRAVENOUS
  Administered 2015-02-15: 40 ug via INTRAVENOUS
  Administered 2015-02-15: 80 ug via INTRAVENOUS

## 2015-02-15 MED ORDER — CEFAZOLIN SODIUM-DEXTROSE 2-3 GM-% IV SOLR
INTRAVENOUS | Status: AC
  Filled 2015-02-15: qty 50

## 2015-02-15 MED ORDER — MIDAZOLAM HCL 2 MG/2ML IJ SOLN
INTRAMUSCULAR | Status: AC
  Filled 2015-02-15: qty 2

## 2015-02-15 MED ORDER — PROMETHAZINE HCL 25 MG/ML IJ SOLN
INTRAMUSCULAR | Status: AC
  Filled 2015-02-15: qty 1

## 2015-02-15 MED ORDER — ONDANSETRON HCL 8 MG PO TABS: 8.0000 mg | ORAL_TABLET | Freq: Three times a day (TID) | ORAL | Status: DC | PRN

## 2015-02-15 MED ORDER — HYDROMORPHONE HCL 1 MG/ML IJ SOLN: 0.2500 mg | INTRAMUSCULAR | Status: DC | PRN

## 2015-02-15 SURGICAL SUPPLY — 27 items
BAG URO CATCHER STRL LF (DRAPE) ×3 IMPLANT
BASKET STNLS GEMINI 4WIRE 3FR (BASKET) IMPLANT
BASKET ZERO TIP NITINOL 2.4FR (BASKET) ×2 IMPLANT
BSKT STON RTRVL GEM 120X11 3FR (BASKET)
BSKT STON RTRVL ZERO TP 2.4FR (BASKET) ×1
CATH INTERMIT  6FR 70CM (CATHETERS) ×3 IMPLANT
CLOTH BEACON ORANGE TIMEOUT ST (SAFETY) ×3 IMPLANT
FIBER LASER FLEXIVA 1000 (UROLOGICAL SUPPLIES) IMPLANT
FIBER LASER FLEXIVA 200 (UROLOGICAL SUPPLIES) ×2 IMPLANT
FIBER LASER FLEXIVA 365 (UROLOGICAL SUPPLIES) IMPLANT
FIBER LASER FLEXIVA 550 (UROLOGICAL SUPPLIES) IMPLANT
FIBER LASER TRAC TIP (UROLOGICAL SUPPLIES) IMPLANT
GLOVE BIOGEL M STRL SZ7.5 (GLOVE) ×3 IMPLANT
GOWN STRL REUS W/TWL LRG LVL3 (GOWN DISPOSABLE) ×3 IMPLANT
GOWN STRL REUS W/TWL XL LVL3 (GOWN DISPOSABLE) ×3 IMPLANT
GUIDEWIRE ANG ZIPWIRE 038X150 (WIRE) ×2 IMPLANT
GUIDEWIRE STR DUAL SENSOR (WIRE) ×3 IMPLANT
IV NS 1000ML (IV SOLUTION) ×3
IV NS 1000ML BAXH (IV SOLUTION) ×1 IMPLANT
MANIFOLD NEPTUNE II (INSTRUMENTS) ×3 IMPLANT
NS IRRIG 1000ML POUR BTL (IV SOLUTION) ×3 IMPLANT
PACK CYSTO (CUSTOM PROCEDURE TRAY) ×3 IMPLANT
SCRUB PCMX 4 OZ (MISCELLANEOUS) ×3 IMPLANT
STENT URET 6FRX24 CONTOUR (STENTS) ×2 IMPLANT
SYRINGE 10CC LL (SYRINGE) ×2 IMPLANT
TUBING CONNECTING 10 (TUBING) ×2 IMPLANT
TUBING CONNECTING 10' (TUBING) ×1

## 2015-02-15 NOTE — Anesthesia Postprocedure Evaluation (Signed)
  Anesthesia Post-op Note  Patient: Nicholas Mcintosh  Procedure(s) Performed: Procedure(s) with comments: CYSTOSCOPY/RETROGRADE/URETEROSCOPY/STONE EXTRACTION WITH BASKET (Left) - **ADD ON CASE** CYSTOSCOPY WITH HOLMIUM LASER LITHOTRIPSY (Left) CYSTOSCOPY WITH STENT PLACEMENT LEFT URETER (Left) - POSSIBLE JJ STENT  Patient Location: PACU  Anesthesia Type:General  Level of Consciousness: awake  Airway and Oxygen Therapy: Patient Spontanous Breathing  Post-op Pain: mild  Post-op Assessment: Post-op Vital signs reviewed              Post-op Vital Signs: Reviewed  Last Vitals:  Filed Vitals:   02/15/15 2000  BP: 141/81  Pulse:   Temp: 36.4 C  Resp:     Complications: No apparent anesthesia complications

## 2015-02-15 NOTE — Discharge Instructions (Signed)
You have been discharged home with a ureteral stent in place. This is a temporary tube placed in the tube (aka ureter) that drains from the kidney to the bladder. This stent is TEMPORARY and it must be removed or exchanged within three months or else significant damage can occur.  Stents cause most patients to have side effects which may include urinary urgency, frequency, and/or pain with voiding. In a male, they can cause erection problems. They may also cause discomfort with strenuous activity. You will likely receive several medicines to help with these symptoms, for example, oxybutynin (ditropan) or tamsulosin (flomax). Oxybutynin helps quiet the bladder, but it may cause constipation or dry mouth. Please use over-the-counter laxatives or stool softeners to help with the former, and sucking on a sugar-free lozenge can help with dry mouth. Tamsulosin is usually taken at night before going to bed, and it helps with stent discomfort. You may also be prescribed a urinary analgesic (e.g. pyridium, prosed, uribel) to help with pain with urination that can change the color of your urine. Please do not be alarmed by this color change.  You may see some blood in your urine, but this is usually normal. If you have persistent bleeding that is not improving and/or large amounts of clots in your urine and the inability to urinate.  1. You may see some blood in the urine and may have some burning with urination for 48-72 hours. You also may notice that you have to urinate more frequently or urgently after your procedure which is normal.  2. You should call should you develop an inability urinate, fever > 101, persistent nausea and vomiting that prevents you from eating or drinking to stay hydrated.  3. If you have a stent, you will likely urinate more frequently and urgently until the stent is removed and you may experience some discomfort/pain in the lower abdomen and flank especially when urinating. You may take  pain medication prescribed to you if needed for pain. You may also intermittently have blood in the urine until the stent is removed. 4. If you have a catheter, you will be taught how to take care of the catheter by the nursing staff prior to discharge from the hospital.  You may periodically feel a strong urge to void with the catheter in place.  This is a bladder spasm and most often can occur when having a bowel movement or moving around. It is typically self-limited and usually will stop after a few minutes.  You may use some Vaseline or Neosporin around the tip of the catheter to reduce friction at the tip of the penis. You may also see some blood in the urine.  A very small amount of blood can make the urine look quite red.  As long as the catheter is draining well, there usually is not a problem.  However, if the catheter is not draining well and is bloody, you should call the office [phone]) to notify us. General Anesthesia, Care After Refer to this sheet in the next few weeks. These instructions provide you with information on caring for yourself after your procedure. Your health care provider may also give you more specific instructions. Your treatment has been planned according to current medical practices, but problems sometimes occur. Call your health care provider if you have any problems or questions after your procedure. WHAT TO EXPECT AFTER THE PROCEDURE After the procedure, it is typical to experience:  Sleepiness.  Nausea and vomiting. HOME CARE INSTRUCTIONS  For the first 24 hours after general anesthesia:  Have a responsible person with you.  Do not drive a car. If you are alone, do not take public transportation.  Do not drink alcohol.  Do not take medicine that has not been prescribed by your health care provider.  Do not sign important papers or make important decisions.  You may resume a normal diet and activities as directed by your health care  provider.  Change bandages (dressings) as directed.  If you have questions or problems that seem related to general anesthesia, call the hospital and ask for the anesthetist or anesthesiologist on call. SEEK MEDICAL CARE IF:  You have nausea and vomiting that continue the day after anesthesia.  You develop a rash. SEEK IMMEDIATE MEDICAL CARE IF:   You have difficulty breathing.  You have chest pain.  You have any allergic problems. Document Released: 10/22/2000 Document Revised: 07/21/2013 Document Reviewed: 01/29/2013 [facility] Patient Information 2015 [place], [place]. This information is not intended to replace advice given to you by your health care provider. Make sure you discuss any questions you have with your health care provider. General Anesthesia General anesthesia is a sleep-like state of non-feeling produced by medicines (anesthetics). General anesthesia prevents you from being alert and feeling pain during a medical procedure. Your caregiver may recommend general anesthesia if your procedure: Is long. Is painful or uncomfortable. Would be frightening to see or hear. Requires you to be still. Affects your breathing. Causes significant blood loss. LET YOUR CAREGIVER KNOW ABOUT: Allergies to food or medicine. Medicines taken, including vitamins, herbs, eyedrops, over-the-counter medicines, and creams. Use of steroids (by mouth or creams). Previous problems with anesthetics or numbing medicines, including problems experienced by relatives. History of bleeding problems or blood clots. Previous surgeries and types of anesthetics received. Possibility of pregnancy, if this applies. Use of cigarettes, alcohol, or illegal drugs. Any health condition(s), especially diabetes, sleep apnea, and high blood pressure. RISKS AND COMPLICATIONS General anesthesia rarely causes complications. However, if complications do occur, they can be life threatening. Complications include: A  lung infection. A stroke. A heart attack. Waking up during the procedure. When this occurs, the patient may be unable to move and communicate that he or she is awake. The patient may feel severe pain. Older adults and adults with serious medical problems are more likely to have complications than adults who are young and healthy. Some complications can be prevented by answering all of your caregiver's questions thoroughly and by following all pre-procedure instructions. It is important to tell your caregiver if any of the pre-procedure instructions, especially those related to diet, were not followed. Any food or liquid in the stomach can cause problems when you are under general anesthesia. BEFORE THE PROCEDURE Ask your caregiver if you will have to spend the night at the hospital. If you will not have to spend the night, arrange to have an adult drive you and stay with you for 24 hours. Follow your caregiver's instructions if you are taking dietary supplements or medicines. Your caregiver may tell you to stop taking them or to reduce your dosage. Do not smoke for as long as possible before your procedure. If possible, stop smoking 3-6 weeks before the procedure. Do not take new dietary supplements or medicines within 1 week of your procedure unless your caregiver approves them. Do not eat within 8 hours of your procedure or as directed by your caregiver. Drink only clear liquids, such as water, black coffee (without milk  or cream), and fruit juices (without pulp). Do not drink within 3 hours of your procedure or as directed by your caregiver. You may brush your teeth on the morning of the procedure, but make sure to spit out the toothpaste and water when finished. PROCEDURE  You will receive anesthetics through a mask, through an intravenous (IV) access tube, or through both. A doctor who specializes in anesthesia (anesthesiologist) or a nurse who specializes in anesthesia (nurse anesthetist) or both  will stay with you throughout the procedure to make sure you remain unconscious. He or she will also watch your blood pressure, pulse, and oxygen levels to make sure that the anesthetics do not cause any problems. Once you are asleep, a breathing tube or mask may be used to help you breathe. AFTER THE PROCEDURE You will wake up after the procedure is complete. You may be in the room where the procedure was performed or in a recovery area. You may have a sore throat if a breathing tube was used. You may also feel: Dizzy. Weak. Drowsy. Confused. Nauseous. Cold. These are all normal responses and can be expected to last for up to 24 hours after the procedure is complete. A caregiver will tell you when you are ready to go home. This will usually be when you are fully awake and in stable condition. Document Released: 10/23/2007 Document Revised: 11/30/2013 Document Reviewed: 11/14/2011 [facility] Patient Information 2015 [place]. This information is not intended to replace advice given to you by your health care provider. Make sure you discuss any questions you have with your health care provider. General Anesthesia General anesthesia is a sleep-like state of non-feeling produced by medicines (anesthetics). General anesthesia prevents you from being alert and feeling pain during a medical procedure. Your caregiver may recommend general anesthesia if your procedure:  Is long.  Is painful or uncomfortable.  Would be frightening to see or hear.  Requires you to be still.  Affects your breathing.  Causes significant blood loss. LET YOUR CAREGIVER KNOW ABOUT:  Allergies to food or medicine.  Medicines taken, including vitamins, herbs, eyedrops, over-the-counter medicines, and creams.  Use of steroids (by mouth or creams).  Previous problems with anesthetics or numbing medicines, including problems experienced by relatives.  History of bleeding problems or blood clots.  Previous  surgeries and types of anesthetics received.  Possibility of pregnancy, if this applies.  Use of cigarettes, alcohol, or illegal drugs.  Any health condition(s), especially diabetes, sleep apnea, and high blood pressure. RISKS AND COMPLICATIONS General anesthesia rarely causes complications. However, if complications do occur, they can be life threatening. Complications include:  A lung infection.  A stroke.  A heart attack.  Waking up during the procedure. When this occurs, the patient may be unable to move and communicate that he or she is awake. The patient may feel severe pain. Older adults and adults with serious medical problems are more likely to have complications than adults who are young and healthy. Some complications can be prevented by answering all of your caregiver's questions thoroughly and by following all pre-procedure instructions. It is important to tell your caregiver if any of the pre-procedure instructions, especially those related to diet, were not followed. Any food or liquid in the stomach can cause problems when you are under general anesthesia. BEFORE THE PROCEDURE  Ask your caregiver if you will have to spend the night at the hospital. If you will not have to spend the night, arrange to have an adult  drive you and stay with you for 24 hours.  Follow your caregiver's instructions if you are taking dietary supplements or medicines. Your caregiver may tell you to stop taking them or to reduce your dosage.  Do not smoke for as long as possible before your procedure. If possible, stop smoking 3-6 weeks before the procedure.  Do not take new dietary supplements or medicines within 1 week of your procedure unless your caregiver approves them.  Do not eat within 8 hours of your procedure or as directed by your caregiver. Drink only clear liquids, such as water, black coffee (without milk or cream), and fruit juices (without pulp).  Do not drink within 3 hours of  your procedure or as directed by your caregiver.  You may brush your teeth on the morning of the procedure, but make sure to spit out the toothpaste and water when finished. PROCEDURE  You will receive anesthetics through a mask, through an intravenous (IV) access tube, or through both. A doctor who specializes in anesthesia (anesthesiologist) or a nurse who specializes in anesthesia (nurse anesthetist) or both will stay with you throughout the procedure to make sure you remain unconscious. He or she will also watch your blood pressure, pulse, and oxygen levels to make sure that the anesthetics do not cause any problems. Once you are asleep, a breathing tube or mask may be used to help you breathe. AFTER THE PROCEDURE You will wake up after the procedure is complete. You may be in the room where the procedure was performed or in a recovery area. You may have a sore throat if a breathing tube was used. You may also feel:  Dizzy.  Weak.  Drowsy.  Confused.  Nauseous.  Cold. These are all normal responses and can be expected to last for up to 24 hours after the procedure is complete. A caregiver will tell you when you are ready to go home. This will usually be when you are fully awake and in stable condition. Document Released: 10/23/2007 Document Revised: 11/30/2013 Document Reviewed: 11/14/2011 [facility] Patient Information 2015 [place], [place]. This information is not intended to replace advice given to you by your health care provider. Make sure you discuss any questions you have with your health care provider.

## 2015-02-15 NOTE — Op Note (Signed)
Post-operative Diagnosis: 1. Ureteral stone (592.1)  Procedure and Anesthesia:  Procedure(s) and Anesthesia Type:    * CYSTOSCOPY/RETROGRADE/URETEROSCOPY/STONE EXTRACTION WITH BASKET - General    * CYSTOSCOPY WITH HOLMIUM LASER LITHOTRIPSY - General    * CYSTOSCOPY WITH STENT PLACEMENT - General 1. Cystourethroscopy 2. Ureteral catheterization with instillation of contrast 3. Ureteroscopy with lithotripsy and basket extraction 4. Intra-operative fluoroscopy <1 hr  Surgeon: Surgeon(s) and Role:    * Jethro Bolus, MD - Primary   Resident:  Loura Pardon, MD  EBL: * No blood loss amount entered *  IVF: See anesthesia record  UOP: See anesthesia record  Drains:  LEFT 6 Fr x 24cm JJ ureteral stent without dangler  Specimens:  1. Stone for chemical analysis  Complications: * No complications entered in OR log *  Indications for Surgery: 49 y.o. male with nephrolithiasis. See pre-operative H&P for full details. Risks, benefits, and alternatives of the above procedure were discussed previously in detail and informed consents was signed and verified.  Findings:  Cystoscopy revealed normal anterior, prostatic urethra and bladder neck. Bladder was normal with orthotopic ureteral orifices bilaterally, no evidence of lesion, stone, trabeculation or infected urine.  Distal ureter was exceedingly lateral and exceptionally challenging to cannulate. Ultimately via 6.5 Fr ureteroscopic guidance we were able to pass an an angled glidewire into the ureter. The distal ureter was very ratty with a significant false passage medially. Successful stent placement post-procedurally.  Ureteroscopy revealed multiple golden yellow stones in the distal ureter broken into smaller fragments via laser and removed via basket extraction. All visible fragments larger than the tip of the laser fiber were removed.  KUB/retrograde pyelogram demonstrated radioopaque stone in the mid ureter. No clear retrograde  pyelogram was performed as we ultimately accessed the ureter under direct visualization  Procedure Details: The patient and consent was verified in the pre-op holding area and brought to the operating room where they were placed on the operating table. Pre-induction time out was called and general anesthesia was induced. SCDs were placed and IV antibiotics were started.   We began by inserting the 22.5 Fr rigid cystoscope per urethra with ample lubrication and normal saline running. We performed pan cystoscopy with the findings as documented above. We then turned our attention to the LEFT ureteral orifice which was exceptionally lateral and small and very difficult to cannulate. We ultimately were able to navigate to the true lumen with the 6.5 Fr rigid ureteroscope and we cannulated with an angled-tip 0.038 glidewire via the scope under direct visualization through the ureteroscope. We immediately encountered golden yellow-brownish stone some of which we were able to basket extract, however we ultimately discovered a ~72mm golden-brown stone.  We then elected to advance a 270 micron laser fiber and performed laser lithotripsy with settings starting at 0.6 Joules and 6 Hertz. The stone was fragmented to small enough pieces to be removed via a 0-tip nitinol basket. The stone fragments were dropped in the bladder until all visible pieces were removed. We then advanced the scope beyond the level of the stone as far as the ureter would accommodate and no additional stones were seen. We then re-inserted our cystoscope and emptied the bladder of all stone fragments which were sent for chemical analysis.  At this point we proceeded with ureteral stent placement. We advanced a 6 Fr x 24 cm JJ stent over our sensor wire under visual and fluoroscopic guidance. Appropriate placement of the proximal curl was noted in the renal pelvis and  the distal curl was confirmed in the bladder. Stent appeared to be a good fit without  the distal curl crossing the midline. The bladder was emptied, anesthesia was reversed and the patient awoke having tolerated the procedure well. They were transferred to the PACU in stable condition.  Post-operative plan: 1. Discharge home per PACU protocol 2. Follow-up in 2 weeks in clinic for cysto, JJ stent removal  Teaching Physician Attestation: Dr. Patsi Searsannenbaum was present and scrubbed for the entirety of the procedure  Madlyn FrankelMatthew D. Nunzio CoryLyons, MD Resident [facility] Department of Urologic Surgery/[facility]

## 2015-02-15 NOTE — Anesthesia Procedure Notes (Addendum)
Procedure Name: LMA Insertion Date/Time: 02/15/2015 5:46 PM Performed by: Epimenio SarinJARVELA, Keeton Kassebaum R Pre-anesthesia Checklist: Patient identified, Emergency Drugs available, Suction available, Patient being monitored and Timeout performed Patient Re-evaluated:Patient Re-evaluated prior to inductionOxygen Delivery Method: Circle system utilized Preoxygenation: Pre-oxygenation with 100% oxygen Intubation Type: IV induction Ventilation: Mask ventilation without difficulty LMA: LMA with gastric port inserted LMA Size: 4.0 Number of attempts: 1 Dental Injury: Teeth and Oropharynx as per pre-operative assessment

## 2015-02-15 NOTE — Anesthesia Preprocedure Evaluation (Signed)
Anesthesia Evaluation  Patient identified by MRN, date of birth, ID band Patient awake    Reviewed: Allergy & Precautions, NPO status , Patient's Chart, lab work & pertinent test results  History of Anesthesia Complications (+) PONV  Airway Mallampati: II  TM Distance: >3 FB Neck ROM: Full    Dental   Pulmonary asthma ,  breath sounds clear to auscultation        Cardiovascular hypertension, + angina Rhythm:Regular Rate:Normal     Neuro/Psych    GI/Hepatic negative GI ROS, Neg liver ROS,   Endo/Other  Hypothyroidism   Renal/GU negative Renal ROS     Musculoskeletal   Abdominal   Peds  Hematology   Anesthesia Other Findings   Reproductive/Obstetrics                             Anesthesia Physical Anesthesia Plan  ASA: III  Anesthesia Plan: General   Post-op Pain Management:    Induction: Intravenous  Airway Management Planned: LMA  Additional Equipment:   Intra-op Plan:   Post-operative Plan: Extubation in OR  Informed Consent: I have reviewed the patients History and Physical, chart, labs and discussed the procedure including the risks, benefits and alternatives for the proposed anesthesia with the patient or authorized representative who has indicated his/her understanding and acceptance.   Dental advisory given  Plan Discussed with: CRNA and Anesthesiologist  Anesthesia Plan Comments:         Anesthesia Quick Evaluation

## 2015-02-15 NOTE — Interval H&P Note (Signed)
History and Physical Interval Note:  02/15/2015 5:23 PM  Nicholas Mcintosh  has presented today for surgery, with the diagnosis of LEFT MID URETERAL STONES  The various methods of treatment have been discussed with the patient and family. After consideration of risks, benefits and other options for treatment, the patient has consented to  Procedure(s) with comments: CYSTOSCOPY/RETROGRADE/URETEROSCOPY/STONE EXTRACTION WITH BASKET (Left) - **ADD ON CASE** CYSTOSCOPY WITH HOLMIUM LASER LITHOTRIPSY (Left) CYSTOSCOPY WITH STENT PLACEMENT (Left) - POSSIBLE JJ STENT as a surgical intervention .  The patient's history has been reviewed, patient examined, no change in status, stable for surgery.  I have reviewed the patient's chart and labs.  Questions were answered to the patient's satisfaction.     Jonel Sick I Jorma Tassinari

## 2015-02-15 NOTE — Transfer of Care (Signed)
Immediate Anesthesia Transfer of Care Note  Patient: Nicholas Mcintosh  Procedure(s) Performed: Procedure(s) with comments: CYSTOSCOPY/RETROGRADE/URETEROSCOPY/STONE EXTRACTION WITH BASKET (Left) - **ADD ON CASE** CYSTOSCOPY WITH HOLMIUM LASER LITHOTRIPSY (Left) CYSTOSCOPY WITH STENT PLACEMENT LEFT URETER (Left) - POSSIBLE JJ STENT  Patient Location: PACU  Anesthesia Type:General  Level of Consciousness: sedated, patient cooperative and responds to stimulation  Airway & Oxygen Therapy: Patient Spontanous Breathing and Patient connected to face mask oxygen  Post-op Assessment: Report given to RN and Post -op Vital signs reviewed and stable  Post vital signs: Reviewed and stable  Last Vitals:  Filed Vitals:   02/15/15 1503  BP: 135/80  Pulse: 80  Temp: 36.9 C  Resp: 18    Complications: No apparent anesthesia complications

## 2015-02-16 ENCOUNTER — Encounter (HOSPITAL_COMMUNITY): Payer: Self-pay | Admitting: Urology

## 2015-02-17 ENCOUNTER — Encounter: Payer: Self-pay | Admitting: Physical Medicine & Rehabilitation

## 2015-02-24 ENCOUNTER — Telehealth: Payer: Self-pay | Admitting: Family Medicine

## 2015-02-24 NOTE — Telephone Encounter (Signed)
Refill request for oxycodone 5/325mg . LOV 02/10/2015. Please advise. Thanks!

## 2015-02-25 ENCOUNTER — Other Ambulatory Visit (HOSPITAL_COMMUNITY): Payer: Self-pay | Admitting: Family Medicine

## 2015-02-25 ENCOUNTER — Ambulatory Visit (HOSPITAL_COMMUNITY): Payer: Self-pay | Admitting: Psychiatry

## 2015-03-01 ENCOUNTER — Other Ambulatory Visit: Payer: Self-pay | Admitting: Family Medicine

## 2015-03-01 MED ORDER — HYDROCODONE-ACETAMINOPHEN 5-325 MG PO TABS: 1.0000 | ORAL_TABLET | Freq: Four times a day (QID) | ORAL | Status: DC | PRN

## 2015-03-01 MED ORDER — OXYCODONE-ACETAMINOPHEN 5-325 MG PO TABS: 1.0000 | ORAL_TABLET | ORAL | Status: DC | PRN

## 2015-03-05 ENCOUNTER — Emergency Department (HOSPITAL_COMMUNITY): Payer: 59

## 2015-03-05 ENCOUNTER — Emergency Department (HOSPITAL_COMMUNITY)
Admission: EM | Admit: 2015-03-05 | Discharge: 2015-03-06 | Disposition: A | Discharge status: Home / Self Care | Payer: 59 | Attending: Emergency Medicine | Admitting: Emergency Medicine

## 2015-03-05 ENCOUNTER — Encounter (HOSPITAL_COMMUNITY): Payer: Self-pay | Admitting: *Deleted

## 2015-03-05 DIAGNOSIS — E039 Hypothyroidism, unspecified: Secondary | ICD-10-CM | POA: Insufficient documentation

## 2015-03-05 DIAGNOSIS — I10 Essential (primary) hypertension: Secondary | ICD-10-CM | POA: Diagnosis not present

## 2015-03-05 DIAGNOSIS — J45990 Exercise induced bronchospasm: Secondary | ICD-10-CM | POA: Insufficient documentation

## 2015-03-05 DIAGNOSIS — N23 Unspecified renal colic: Secondary | ICD-10-CM | POA: Diagnosis not present

## 2015-03-05 DIAGNOSIS — R1011 Right upper quadrant pain: Secondary | ICD-10-CM | POA: Diagnosis present

## 2015-03-05 DIAGNOSIS — R109 Unspecified abdominal pain: Secondary | ICD-10-CM

## 2015-03-05 DIAGNOSIS — I209 Angina pectoris, unspecified: Secondary | ICD-10-CM | POA: Diagnosis not present

## 2015-03-05 DIAGNOSIS — Z7952 Long term (current) use of systemic steroids: Secondary | ICD-10-CM | POA: Insufficient documentation

## 2015-03-05 DIAGNOSIS — Z79899 Other long term (current) drug therapy: Secondary | ICD-10-CM | POA: Diagnosis not present

## 2015-03-05 DIAGNOSIS — N132 Hydronephrosis with renal and ureteral calculous obstruction: Secondary | ICD-10-CM | POA: Insufficient documentation

## 2015-03-05 LAB — COMPREHENSIVE METABOLIC PANEL
ALT: 36 U/L (ref 17–63)
ANION GAP: 7 (ref 5–15)
AST: 29 U/L (ref 15–41)
Albumin: 4.6 g/dL (ref 3.5–5.0)
Alkaline Phosphatase: 66 U/L (ref 38–126)
BUN: 18 mg/dL (ref 6–20)
CO2: 28 mmol/L (ref 22–32)
Calcium: 9.6 mg/dL (ref 8.9–10.3)
Chloride: 104 mmol/L (ref 101–111)
Creatinine, Ser: 1.03 mg/dL (ref 0.61–1.24)
GFR calc Af Amer: >60 mL/min (ref >60)
GFR calc non Af Amer: >60 mL/min (ref >60)
GLUCOSE: 127 mg/dL — AB (ref 65–99)
Potassium: 4 mmol/L (ref 3.5–5.1)
SODIUM: 139 mmol/L (ref 135–145)
Total Bilirubin: 0.8 mg/dL (ref 0.3–1.2)
Total Protein: 8 g/dL (ref 6.5–8.1)

## 2015-03-05 LAB — URINALYSIS, ROUTINE W REFLEX MICROSCOPIC
BILIRUBIN URINE: NEGATIVE
Glucose, UA: NEGATIVE mg/dL
Ketones, ur: NEGATIVE mg/dL
Leukocytes, UA: NEGATIVE
NITRITE: NEGATIVE
Protein, ur: NEGATIVE mg/dL
Specific Gravity, Urine: 1.014 (ref 1.005–1.030)
Urobilinogen, UA: 0.2 mg/dL (ref 0.0–1.0)
pH: 7.5 (ref 5.0–8.0)

## 2015-03-05 LAB — URINE MICROSCOPIC-ADD ON

## 2015-03-05 LAB — LIPASE, BLOOD: Lipase: 21 U/L — ABNORMAL LOW (ref 22–51)

## 2015-03-05 LAB — CBC
HCT: 36.6 % — ABNORMAL LOW (ref 39.0–52.0)
Hemoglobin: 12 g/dL — ABNORMAL LOW (ref 13.0–17.0)
MCH: 26.2 pg (ref 26.0–34.0)
MCHC: 32.8 g/dL (ref 30.0–36.0)
MCV: 79.9 fL (ref 78.0–100.0)
Platelets: 198 10*3/uL (ref 150–400)
RBC: 4.58 MIL/uL (ref 4.22–5.81)
RDW: 16.3 % — ABNORMAL HIGH (ref 11.5–15.5)
WBC: 7.8 10*3/uL (ref 4.0–10.5)

## 2015-03-05 MED ORDER — KETOROLAC TROMETHAMINE 30 MG/ML IJ SOLN
30.0000 mg | Freq: Once | INTRAMUSCULAR | Status: AC
  Administered 2015-03-05: 30 mg via INTRAVENOUS
  Filled 2015-03-05: qty 1

## 2015-03-05 MED ORDER — ONDANSETRON HCL 4 MG/2ML IJ SOLN
4.0000 mg | Freq: Once | INTRAMUSCULAR | Status: AC
  Filled 2015-03-05: qty 2

## 2015-03-05 MED ORDER — SODIUM CHLORIDE 0.9 % IV BOLUS (SEPSIS)
1000.0000 mL | Freq: Once | INTRAVENOUS | Status: AC
  Administered 2015-03-05: 1000 mL via INTRAVENOUS

## 2015-03-05 MED ORDER — FENTANYL CITRATE (PF) 100 MCG/2ML IJ SOLN
100.0000 ug | Freq: Once | INTRAMUSCULAR | Status: AC
  Administered 2015-03-05: 100 ug via INTRAVENOUS
  Filled 2015-03-05: qty 2

## 2015-03-05 MED ORDER — HYDROMORPHONE HCL 1 MG/ML IJ SOLN
1.0000 mg | Freq: Once | INTRAMUSCULAR | Status: AC
  Administered 2015-03-05: 1 mg via INTRAVENOUS
  Filled 2015-03-05: qty 1

## 2015-03-05 MED ORDER — ONDANSETRON HCL 4 MG/2ML IJ SOLN
4.0000 mg | Freq: Once | INTRAMUSCULAR | Status: AC | PRN
  Administered 2015-03-05: 4 mg via INTRAVENOUS

## 2015-03-05 MED ORDER — ONDANSETRON HCL 4 MG/2ML IJ SOLN
4.0000 mg | Freq: Once | INTRAMUSCULAR | Status: AC
  Administered 2015-03-05: 4 mg via INTRAVENOUS
  Filled 2015-03-05: qty 2
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
## 2015-03-05 NOTE — ED Notes (Signed)
Pt states that he has had RUQ discomfort for thw last 2 months; pt states that the pain suddenly increased this afternoon; pt c/o nausea with no vomiting; pt recently treated for kidney stones but states that this is not the same

## 2015-03-06 MED ORDER — KETOROLAC TROMETHAMINE 10 MG PO TABS: 10.0000 mg | ORAL_TABLET | Freq: Four times a day (QID) | ORAL | Status: DC | PRN

## 2015-03-06 MED ORDER — PROMETHAZINE HCL 25 MG PO TABS
25.0000 mg | ORAL_TABLET | Freq: Four times a day (QID) | ORAL | Status: DC | PRN
Start: 2015-03-06 — End: 2015-04-26

## 2015-03-06 MED ORDER — OXYCODONE HCL 5 MG PO TABS
5.0000 mg | ORAL_TABLET | ORAL | Status: DC | PRN
Start: 2015-03-06 — End: 2015-08-22

## 2015-03-06 NOTE — ED Notes (Signed)
Pt advised to call urology on Monday morning and follow up per discussion he had with [place] PA

## 2015-03-06 NOTE — Discharge Instructions (Signed)
Kidney Stones  Kidney stones (urolithiasis) are deposits that form inside your kidneys. The intense pain is caused by the stone moving through the urinary tract. When the stone moves, the ureter goes into spasm around the stone. The stone is usually passed in the urine.   CAUSES   · A disorder that makes certain neck glands produce too much parathyroid hormone (primary hyperparathyroidism).  · A buildup of uric acid crystals, similar to gout in your joints.  · Narrowing (stricture) of the ureter.  · A kidney obstruction present at birth (congenital obstruction).  · Previous surgery on the kidney or ureters.  · Numerous kidney infections.  SYMPTOMS   · Feeling sick to your stomach (nauseous).  · Throwing up (vomiting).  · Blood in the urine (hematuria).  · Pain that usually spreads (radiates) to the groin.  · Frequency or urgency of urination.  DIAGNOSIS   · Taking a history and physical exam.  · Blood or urine tests.  · CT scan.  · Occasionally, an examination of the inside of the urinary bladder (cystoscopy) is performed.  TREATMENT   · Observation.  · Increasing your fluid intake.  · Extracorporeal shock wave lithotripsy--This is a noninvasive procedure that uses shock waves to break up kidney stones.  · Surgery may be needed if you have severe pain or persistent obstruction. There are various surgical procedures. Most of the procedures are performed with the use of small instruments. Only small incisions are needed to accommodate these instruments, so recovery time is minimized.  The size, location, and chemical composition are all important variables that will determine the proper choice of action for you. Talk to your health care provider to better understand your situation so that you will minimize the risk of injury to yourself and your kidney.   HOME CARE INSTRUCTIONS   · Drink enough water and fluids to keep your urine clear or pale yellow. This will help you to pass the stone or stone fragments.  · Strain  all urine through the provided strainer. Keep all particulate matter and stones for your health care provider to see. The stone causing the pain may be as small as a grain of salt. It is very important to use the strainer each and every time you pass your urine. The collection of your stone will allow your health care provider to analyze it and verify that a stone has actually passed. The stone analysis will often identify what you can do to reduce the incidence of recurrences.  · Only take over-the-counter or prescription medicines for pain, discomfort, or fever as directed by your health care provider.  · Make a follow-up appointment with your health care provider as directed.  · Get follow-up X-rays if required. The absence of pain does not always mean that the stone has passed. It may have only stopped moving. If the urine remains completely obstructed, it can cause loss of kidney function or even complete destruction of the kidney. It is your responsibility to make sure X-rays and follow-ups are completed. Ultrasounds of the kidney can show blockages and the status of the kidney. Ultrasounds are not associated with any radiation and can be performed easily in a matter of minutes.  SEEK MEDICAL CARE IF:  · You experience pain that is progressive and unresponsive to any pain medicine you have been prescribed.  SEEK IMMEDIATE MEDICAL CARE IF:   · Pain cannot be controlled with the prescribed medicine.  · You have a fever or   shaking chills.  · The severity or intensity of pain increases over 18 hours and is not relieved by pain medicine.  · You develop a new onset of abdominal pain.  · You feel faint or pass out.  · You are unable to urinate.  MAKE SURE YOU:   · Understand these instructions.  · Will watch your condition.  · Will get help right away if you are not doing well or get worse.  Document Released: 07/16/2005 Document Revised: 03/18/2013 Document Reviewed: 12/17/2012  ExitCare® Patient Information ©2015  [employer]. This information is not intended to replace advice given to you by your health care provider. Make sure you discuss any questions you have with your health care provider.    Dietary Guidelines to Help Prevent Kidney Stones  Your risk of kidney stones can be decreased by adjusting the foods you eat. The most important thing you can do is drink enough fluid. You should drink enough fluid to keep your urine clear or pale yellow. The following guidelines provide specific information for the type of kidney stone you have had.  GUIDELINES ACCORDING TO TYPE OF KIDNEY STONE  Calcium Oxalate Kidney Stones  · Reduce the amount of salt you eat. Foods that have a lot of salt cause your body to release excess calcium into your urine. The excess calcium can combine with a substance called oxalate to form kidney stones.  · Reduce the amount of animal protein you eat if the amount you eat is excessive. Animal protein causes your body to release excess calcium into your urine. Ask your dietitian how much protein from animal sources you should be eating.  · Avoid foods that are high in oxalates. If you take vitamins, they should have less than 500 mg of vitamin C. Your body turns vitamin C into oxalates. You do not need to avoid fruits and vegetables high in vitamin C.  Calcium Phosphate Kidney Stones  · Reduce the amount of salt you eat to help prevent the release of excess calcium into your urine.  · Reduce the amount of animal protein you eat if the amount you eat is excessive. Animal protein causes your body to release excess calcium into your urine. Ask your dietitian how much protein from animal sources you should be eating.  · Get enough calcium from food or take a calcium supplement (ask your dietitian for recommendations). Food sources of calcium that do not increase your risk of kidney stones include:  ¨ Broccoli.  ¨ Dairy products, such as cheese and yogurt.  ¨ Pudding.  Uric Acid Kidney Stones  · Do not  have more than 6 oz of animal protein per day.  FOOD SOURCES  Animal Protein Sources  · Meat (all types).  · Poultry.  · Eggs.  · Fish, seafood.  Foods High in Salt  · Salt seasonings.  · Soy sauce.  · Teriyaki sauce.  · Cured and processed meats.  · Salted crackers and snack foods.  · Fast food.  · Canned soups and most canned foods.  Foods High in Oxalates  · Grains:  ¨ Amaranth.  ¨ Barley.  ¨ Grits.  ¨ Wheat germ.  ¨ Bran.  ¨ Buckwheat flour.  ¨ All bran cereals.  ¨ Pretzels.  ¨ Whole wheat bread.  · Vegetables:  ¨ Beans (wax).  ¨ Beets and beet greens.  ¨ Collard greens.  ¨ Eggplant.  ¨ Escarole.  ¨ Leeks.  ¨ Okra.  ¨ Parsley.  ¨ Rutabagas.  ¨   Spinach.  ¨ Swiss chard.  ¨ Tomato paste.  ¨ Fried potatoes.  ¨ Sweet potatoes.  · Fruits:  ¨ Red currants.  ¨ Figs.  ¨ Kiwi.  ¨ Rhubarb.  · Meat and Other Protein Sources:  ¨ Beans (dried).  ¨ Soy burgers and other soybean products.  ¨ Miso.  ¨ Nuts (peanuts, almonds, pecans, cashews, hazelnuts).  ¨ Nut butters.  ¨ Sesame seeds and tahini (paste made of sesame seeds).  ¨ Poppy seeds.  · Beverages:  ¨ Chocolate drink mixes.  ¨ Soy milk.  ¨ Instant iced tea.  ¨ Juices made from high-oxalate fruits or vegetables.  · Other:  ¨ Carob.  ¨ Chocolate.  ¨ Fruitcake.  ¨ Marmalades.  Document Released: 11/10/2010 Document Revised: 07/21/2013 Document Reviewed: 06/12/2013  ExitCare® Patient Information ©2015 [employer]. This information is not intended to replace advice given to you by your health care provider. Make sure you discuss any questions you have with your health care provider.

## 2015-03-10 ENCOUNTER — Ambulatory Visit: Payer: Self-pay | Admitting: Physical Medicine & Rehabilitation

## 2015-03-10 ENCOUNTER — Ambulatory Visit: Payer: Self-pay

## 2015-03-12 ENCOUNTER — Other Ambulatory Visit: Payer: Self-pay | Admitting: Family Medicine

## 2015-03-12 NOTE — Telephone Encounter (Signed)
According to my calculations, he is not due for a regular refill until 8/22. Plus he go 2 days worth from ED which means he should have enough to has until 8.24.  Check me please.

## 2015-03-14 NOTE — Telephone Encounter (Signed)
This was an old message. Has already been taken care of. Thanks!

## 2015-03-15 ENCOUNTER — Other Ambulatory Visit: Payer: Self-pay | Admitting: Urology

## 2015-03-24 ENCOUNTER — Telehealth: Payer: Self-pay | Admitting: Family Medicine

## 2015-03-24 NOTE — Telephone Encounter (Signed)
Received medical records request from [facility] for patient. Forwarded to [employer].

## 2015-03-29 ENCOUNTER — Encounter: Payer: 59 | Attending: Physical Medicine & Rehabilitation

## 2015-03-29 ENCOUNTER — Ambulatory Visit (HOSPITAL_BASED_OUTPATIENT_CLINIC_OR_DEPARTMENT_OTHER): Payer: 59 | Admitting: Physical Medicine & Rehabilitation

## 2015-03-29 ENCOUNTER — Encounter: Payer: Self-pay | Admitting: Physical Medicine & Rehabilitation

## 2015-03-29 ENCOUNTER — Other Ambulatory Visit: Payer: Self-pay | Admitting: Physical Medicine & Rehabilitation

## 2015-03-29 VITALS — BP 131/96 | HR 86

## 2015-03-29 DIAGNOSIS — G894 Chronic pain syndrome: Secondary | ICD-10-CM | POA: Diagnosis not present

## 2015-03-29 DIAGNOSIS — G8929 Other chronic pain: Secondary | ICD-10-CM | POA: Diagnosis present

## 2015-03-29 DIAGNOSIS — M545 Low back pain: Secondary | ICD-10-CM | POA: Diagnosis present

## 2015-03-29 DIAGNOSIS — R209 Unspecified disturbances of skin sensation: Secondary | ICD-10-CM | POA: Insufficient documentation

## 2015-03-29 DIAGNOSIS — M549 Dorsalgia, unspecified: Secondary | ICD-10-CM | POA: Diagnosis not present

## 2015-03-29 DIAGNOSIS — Z79899 Other long term (current) drug therapy: Secondary | ICD-10-CM | POA: Diagnosis not present

## 2015-03-29 DIAGNOSIS — Z5181 Encounter for therapeutic drug level monitoring: Secondary | ICD-10-CM | POA: Diagnosis not present
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
## 2015-03-31 LAB — PMP ALCOHOL METABOLITE (ETG): ETGU: NEGATIVE ng/mL

## 2015-04-04 LAB — OXYCODONE, URINE (LC/MS-MS)
NOROXYCODONE, UR: 667 ng/mL (ref <50)
OXYCODONE, UR: 812 ng/mL (ref <50)
Oxymorphone: 483 ng/mL (ref <50)

## 2015-04-05 LAB — PRESCRIPTION MONITORING PROFILE (SOLSTAS)
Amphetamine/Meth: NEGATIVE ng/mL
BENZODIAZEPINE SCREEN, URINE: NEGATIVE ng/mL
Barbiturate Screen, Urine: NEGATIVE ng/mL
Buprenorphine, Urine: NEGATIVE ng/mL
COCAINE METABOLITES: NEGATIVE ng/mL
CREATININE, URINE: 98.11 mg/dL (ref >20.0)
Cannabinoid Scrn, Ur: NEGATIVE ng/mL
Carisoprodol, Urine: NEGATIVE ng/mL
FENTANYL URINE: NEGATIVE ng/mL
MDMA URINE: NEGATIVE ng/mL
MEPERIDINE UR: NEGATIVE ng/mL
Methadone Screen, Urine: NEGATIVE ng/mL
NITRITES URINE, INITIAL: NEGATIVE ug/mL
Opiate Screen, Urine: NEGATIVE ng/mL
PH URINE, INITIAL: 5.4 pH (ref 4.5–8.9)
Propoxyphene: NEGATIVE ng/mL
TAPENTADOLUR: NEGATIVE ng/mL
Tramadol Scrn, Ur: NEGATIVE ng/mL
Zolpidem, Urine: NEGATIVE ng/mL

## 2015-04-07 ENCOUNTER — Encounter (HOSPITAL_BASED_OUTPATIENT_CLINIC_OR_DEPARTMENT_OTHER): Payer: Self-pay | Admitting: *Deleted

## 2015-04-11 ENCOUNTER — Encounter (HOSPITAL_BASED_OUTPATIENT_CLINIC_OR_DEPARTMENT_OTHER): Payer: Self-pay | Admitting: *Deleted

## 2015-04-14 ENCOUNTER — Telehealth (HOSPITAL_COMMUNITY): Payer: Self-pay

## 2015-04-14 NOTE — Telephone Encounter (Signed)
Telephone call with patient after receiving a fax request refill for Cymbalta and patient last evaluated on 11/01/14.  Patient states he has been doing fine and taking medication daily.  Discussed patient's need to be seen due to canceled appointment from 02/25/15 and he had not rescheduled.  Patient now scheduled for 04/26/15 at 10:00 am with Dr. Lolly Mustache and states he still as enough from last 90 day supply of Cymbalta to last until sees Dr. Lolly Mustache that date.  Patient to call back if any problems prior to appointment.

## 2015-04-14 NOTE — Progress Notes (Signed)
Urine drug screen for this encounter is consistent for prescribed medication 

## 2015-04-15 ENCOUNTER — Ambulatory Visit (HOSPITAL_BASED_OUTPATIENT_CLINIC_OR_DEPARTMENT_OTHER): Admission: RE | Admit: 2015-04-15 | Payer: 59 | Source: Ambulatory Visit | Admitting: Urology

## 2015-04-15 ENCOUNTER — Encounter (HOSPITAL_BASED_OUTPATIENT_CLINIC_OR_DEPARTMENT_OTHER): Admission: RE | Payer: Self-pay | Source: Ambulatory Visit

## 2015-04-15 HISTORY — DX: Other chronic pain: G89.29

## 2015-04-15 HISTORY — DX: Calculus of kidney: N20.0

## 2015-04-15 HISTORY — DX: Dorsalgia, unspecified: M54.9

## 2015-04-15 HISTORY — DX: Exercise induced bronchospasm: J45.990

## 2015-04-15 HISTORY — DX: Male erectile dysfunction, unspecified: N52.9

## 2015-04-15 HISTORY — DX: Calculus of ureter: N20.1

## 2015-04-15 HISTORY — DX: Personal history of urinary calculi: Z87.442

## 2015-04-15 HISTORY — DX: Personal history of traumatic brain injury: Z87.820

## 2015-04-15 SURGERY — CYSTOSCOPY, WITH CALCULUS REMOVAL USING BASKET
Anesthesia: General | Laterality: Right

## 2015-04-26 ENCOUNTER — Ambulatory Visit (INDEPENDENT_AMBULATORY_CARE_PROVIDER_SITE_OTHER): Payer: 59 | Admitting: Psychiatry

## 2015-04-26 ENCOUNTER — Encounter (HOSPITAL_COMMUNITY): Payer: Self-pay | Admitting: Psychiatry

## 2015-04-26 VITALS — BP 130/78 | HR 98 | Ht 67.0 in | Wt 230.0 lb

## 2015-04-26 DIAGNOSIS — F329 Major depressive disorder, single episode, unspecified: Secondary | ICD-10-CM

## 2015-04-26 DIAGNOSIS — F32A Depression, unspecified: Secondary | ICD-10-CM

## 2015-04-26 MED ORDER — DULOXETINE HCL 60 MG PO CPEP: 60.0000 mg | ORAL_CAPSULE | Freq: Every day | ORAL | Status: DC
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
## 2015-05-09 ENCOUNTER — Other Ambulatory Visit: Payer: Self-pay | Admitting: Urology

## 2015-05-13 ENCOUNTER — Ambulatory Visit: Payer: Self-pay | Admitting: Family Medicine

## 2015-05-13 ENCOUNTER — Other Ambulatory Visit: Payer: Self-pay | Admitting: Internal Medicine

## 2015-06-14 ENCOUNTER — Ambulatory Visit (HOSPITAL_COMMUNITY): Payer: Self-pay | Admitting: Psychiatry

## 2015-06-30 ENCOUNTER — Encounter (HOSPITAL_COMMUNITY): Payer: Self-pay | Admitting: *Deleted

## 2015-06-30 ENCOUNTER — Ambulatory Visit (HOSPITAL_COMMUNITY)
Admission: RE | Admit: 2015-06-30 | Discharge: 2015-06-30 | Disposition: A | Discharge status: Home / Self Care | Payer: 59 | Source: Ambulatory Visit | Attending: Urology | Admitting: Urology

## 2015-06-30 ENCOUNTER — Encounter (HOSPITAL_COMMUNITY)
Admission: RE | Disposition: A | Discharge status: Home / Self Care | Payer: Self-pay | Source: Ambulatory Visit | Attending: Urology

## 2015-06-30 ENCOUNTER — Ambulatory Visit (HOSPITAL_COMMUNITY): Payer: 59

## 2015-06-30 DIAGNOSIS — N138 Other obstructive and reflux uropathy: Secondary | ICD-10-CM | POA: Diagnosis not present

## 2015-06-30 DIAGNOSIS — N5201 Erectile dysfunction due to arterial insufficiency: Secondary | ICD-10-CM | POA: Insufficient documentation

## 2015-06-30 DIAGNOSIS — N200 Calculus of kidney: Secondary | ICD-10-CM | POA: Insufficient documentation

## 2015-06-30 DIAGNOSIS — Z87442 Personal history of urinary calculi: Secondary | ICD-10-CM | POA: Diagnosis not present

## 2015-06-30 DIAGNOSIS — N401 Enlarged prostate with lower urinary tract symptoms: Secondary | ICD-10-CM | POA: Diagnosis not present

## 2015-06-30 DIAGNOSIS — R351 Nocturia: Secondary | ICD-10-CM | POA: Diagnosis not present

## 2015-06-30 DIAGNOSIS — Z79899 Other long term (current) drug therapy: Secondary | ICD-10-CM | POA: Diagnosis not present

## 2015-06-30 DIAGNOSIS — R339 Retention of urine, unspecified: Secondary | ICD-10-CM | POA: Insufficient documentation

## 2015-06-30 DIAGNOSIS — E291 Testicular hypofunction: Secondary | ICD-10-CM | POA: Diagnosis not present

## 2015-06-30 DIAGNOSIS — I10 Essential (primary) hypertension: Secondary | ICD-10-CM | POA: Diagnosis not present

## 2015-06-30 DIAGNOSIS — J45909 Unspecified asthma, uncomplicated: Secondary | ICD-10-CM | POA: Diagnosis not present

## 2015-06-30 DIAGNOSIS — E079 Disorder of thyroid, unspecified: Secondary | ICD-10-CM | POA: Diagnosis not present

## 2015-06-30 DIAGNOSIS — Z791 Long term (current) use of non-steroidal anti-inflammatories (NSAID): Secondary | ICD-10-CM | POA: Diagnosis not present

## 2015-06-30 SURGERY — LITHOTRIPSY, ESWL
Anesthesia: LOCAL | Laterality: Right

## 2015-06-30 MED ORDER — DIPHENHYDRAMINE HCL 25 MG PO CAPS
25.0000 mg | ORAL_CAPSULE | ORAL | Status: AC
  Administered 2015-06-30: 25 mg via ORAL
  Filled 2015-06-30: qty 1

## 2015-06-30 MED ORDER — CIPROFLOXACIN HCL 500 MG PO TABS
500.0000 mg | ORAL_TABLET | ORAL | Status: AC
  Administered 2015-06-30: 500 mg via ORAL
  Filled 2015-06-30: qty 1

## 2015-06-30 MED ORDER — SODIUM CHLORIDE 0.9 % IV SOLN
INTRAVENOUS | Status: DC
  Administered 2015-06-30: 09:00:00 via INTRAVENOUS

## 2015-06-30 MED ORDER — DIAZEPAM 5 MG PO TABS
10.0000 mg | ORAL_TABLET | ORAL | Status: AC
  Administered 2015-06-30: 10 mg via ORAL
  Filled 2015-06-30: qty 2

## 2015-06-30 NOTE — Discharge Instructions (Signed)
Dietary Guidelines to Help Prevent Kidney Stones Your risk of kidney stones can be decreased by adjusting the foods you eat. The most important thing you can do is drink enough fluid. You should drink enough fluid to keep your urine clear or pale yellow. The following guidelines provide specific information for the type of kidney stone you have had. GUIDELINES ACCORDING TO TYPE OF KIDNEY STONE Calcium Oxalate Kidney Stones  Reduce the amount of salt you eat. Foods that have a lot of salt cause your body to release excess calcium into your urine. The excess calcium can combine with a substance called oxalate to form kidney stones.  Reduce the amount of animal protein you eat if the amount you eat is excessive. Animal protein causes your body to release excess calcium into your urine. Ask your dietitian how much protein from animal sources you should be eating.  Avoid foods that are high in oxalates. If you take vitamins, they should have less than 500 mg of vitamin C. Your body turns vitamin C into oxalates. You do not need to avoid fruits and vegetables high in vitamin C. Calcium Phosphate Kidney Stones  Reduce the amount of salt you eat to help prevent the release of excess calcium into your urine.  Reduce the amount of animal protein you eat if the amount you eat is excessive. Animal protein causes your body to release excess calcium into your urine. Ask your dietitian how much protein from animal sources you should be eating.  Get enough calcium from food or take a calcium supplement (ask your dietitian for recommendations). Food sources of calcium that do not increase your risk of kidney stones include:  Broccoli.  Dairy products, such as cheese and yogurt.  Pudding. Uric Acid Kidney Stones  Do not have more than 6 oz of animal protein per day. FOOD SOURCES Animal Protein Sources  Meat (all types).  Poultry.  Eggs.  Fish, seafood. Foods High in [employer] seasonings.  Soy  sauce.  Teriyaki sauce.  Cured and processed meats.  Salted crackers and snack foods.  Fast food.  Canned soups and most canned foods. Foods High in Oxalates  Grains:  Amaranth.  Barley.  Grits.  Wheat germ.  Bran.  Buckwheat flour.  All bran cereals.  Pretzels.  Whole wheat bread.  Vegetables:  Beans (wax).  Beets and beet greens.  Collard greens.  Eggplant.  Escarole.  Leeks.  Okra.  Parsley.  Rutabagas.  Spinach.  Swiss chard.  Tomato paste.  Fried potatoes.  Sweet potatoes.  Fruits:  Red currants.  Figs.  Kiwi.  Rhubarb.  Meat and Other Protein Sources:  Beans (dried).  Soy burgers and other soybean products.  Miso.  Nuts (peanuts, almonds, pecans, cashews, hazelnuts).  Nut butters.  Sesame seeds and tahini (paste made of sesame seeds).  Poppy seeds.  Beverages:  Chocolate drink mixes.  Soy milk.  Instant iced tea.  Juices made from high-oxalate fruits or vegetables.  Other:  Carob.  Chocolate.  Fruitcake.  Marmalades.   This information is not intended to replace advice given to you by your health care provider. Make sure you discuss any questions you have with your health care provider.   Document Released: 11/10/2010 Document Revised: 07/21/2013 Document Reviewed: 06/12/2013 Elsevier Interactive Patient Education 2016 [place] Guidelines to Help Prevent Kidney Stones Your risk of kidney stones can be decreased by adjusting the foods you eat. The most important thing you can do is drink enough  fluid. You should drink enough fluid to keep your urine clear or pale yellow. The following guidelines provide specific information for the type of kidney stone you have had. GUIDELINES ACCORDING TO TYPE OF KIDNEY STONE Calcium Oxalate Kidney Stones  Reduce the amount of salt you eat. Foods that have a lot of salt cause your body to release excess calcium into your urine. The excess  calcium can combine with a substance called oxalate to form kidney stones.  Reduce the amount of animal protein you eat if the amount you eat is excessive. Animal protein causes your body to release excess calcium into your urine. Ask your dietitian how much protein from animal sources you should be eating.  Avoid foods that are high in oxalates. If you take vitamins, they should have less than 500 mg of vitamin C. Your body turns vitamin C into oxalates. You do not need to avoid fruits and vegetables high in vitamin C. Calcium Phosphate Kidney Stones  Reduce the amount of salt you eat to help prevent the release of excess calcium into your urine.  Reduce the amount of animal protein you eat if the amount you eat is excessive. Animal protein causes your body to release excess calcium into your urine. Ask your dietitian how much protein from animal sources you should be eating.  Get enough calcium from food or take a calcium supplement (ask your dietitian for recommendations). Food sources of calcium that do not increase your risk of kidney stones include:  Broccoli.  Dairy products, such as cheese and yogurt.  Pudding. Uric Acid Kidney Stones  Do not have more than 6 oz of animal protein per day. FOOD SOURCES Animal Protein Sources  Meat (all types).  Poultry.  Eggs.  Fish, seafood. Foods High in [employer]  Salt seasonings.  Soy sauce.  Teriyaki sauce.  Cured and processed meats.  Salted crackers and snack foods.  Fast food.  Canned soups and most canned foods. Foods High in Oxalates  Grains:  Amaranth.  Barley.  Grits.  Wheat germ.  Bran.  Buckwheat flour.  All bran cereals.  Pretzels.  Whole wheat bread.  Vegetables:  Beans (wax).  Beets and beet greens.  Collard greens.  Eggplant.  Escarole.  Leeks.  Okra.  Parsley.  Rutabagas.  Spinach.  Swiss chard.  Tomato paste.  Fried potatoes.  Sweet potatoes.  Fruits:  Red  currants.  Figs.  Kiwi.  Rhubarb.  Meat and Other Protein Sources:  Beans (dried).  Soy burgers and other soybean products.  Miso.  Nuts (peanuts, almonds, pecans, cashews, hazelnuts).  Nut butters.  Sesame seeds and tahini (paste made of sesame seeds).  Poppy seeds.  Beverages:  Chocolate drink mixes.  Soy milk.  Instant iced tea.  Juices made from high-oxalate fruits or vegetables.  Other:  Carob.  Chocolate.  Fruitcake.  Marmalades.   This information is not intended to replace advice given to you by your health care provider. Make sure you discuss any questions you have with your health care provider.   Document Released: 11/10/2010 Document Revised: 07/21/2013 Document Reviewed: 06/12/2013 Elsevier Interactive Patient Education [employer]2016 Elsevier Inc.

## 2015-06-30 NOTE — H&P (Signed)
Reason For Visit Discuss ESWL   Active Problems Problems  1. 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 Epididymitis (N45.1) 4. Erectile dysfunction due to arterial insufficiency (N52.01)   Assessed By: Jethro Bolusannenbaum, Nilam Quakenbush (Urology); Last Assessed: 30 Dec 2013 5. Gross hematuria (R31.0) 6. 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 Hypogonadism, testicular (E29.1)   Assessed By: Jethro Bolusannenbaum, Derrien Anschutz (Urology); Last Assessed: 29 Apr 2015 8. Incomplete bladder emptying (R33.9) 9. Left nephrolithiasis (N20.0)   Assessed By: Jethro Bolusannenbaum, Jonaven Hilgers (Urology); Last Assessed: 29 Apr 2015 10. Left ureteral stone (N20.1) 11. Microscopic hematuria (R31.29) 12. Nephrolithiasis (N20.0) 13. Nocturia (R35.1)   Assessed By: Jethro Bolusannenbaum, Hymen Arnett (Urology); Last Assessed: 10 Mar 2015 14. Orchitis and epididymitis (N45.3) 15. Prostate cancer screening (Z12.5) 16. Pyuria (N39.0) 17. Right inguinal hernia (K40.90) 18. Right nephrolithiasis (N20.0)   Assessed By: Jethro Bolusannenbaum, Brigitte Soderberg (Urology); Last Assessed: 24 Aug 2014 19. Right ureteral stone (N20.1) 20. Ureteral obstruction (N13.5) 21. Urinary tract infection (N39.0)  History of Present Illness      49 YO male returns today to discuss ESWL for hx of bilateral renal stones.    He is s/p cysto/Lt RPG/Lt ureteroscopy/laser of ureteral stones/basket extraction/Lt JJ stent on 02/15/15. Lt JJ stent was removed on 03/04/15. Hx of bilateral flank pain x 1 mo . CT on 02/10/15 shows two Lt mid ureteral stones with moderate hydronephrosis. 1. 4.254mm and 2. 1-2 mm.     He was previously seen by Jetta Loutiane Warden, NP on 07/29/14 for painless gross hematuria. He takes Meloxicam QHS for pain. Developed painless gross hematuria throughout stream with clots about 10 days ago and it lasted up till 2 days ago. Has remote hx of  nephrolithiasis with last stone >10 yrs ago.     Hx of low Testosterone levels & feeling fatigue. He has purchased a product at the [employer] [facility]. wt: 228-230. Ht 5'6.5". C/o bilateral breast development. ( began after taking Androxy orally). He has been off hormone for > 1 year. Hx of normal testes.    Previous hx of microhematuria and epididymitis.   Past Medical History Problems  1. History of A Fall From A Ladder 2. History of Asthma (J45.909) 3. History of hypertension (Z86.79) 4. History of kidney stones (Z87.442) 5. History of thyroid disease (Z86.39) 6. 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 History of Cystoscopy With Insertion Of Ureteral Stent Left 2. History of Cystoscopy With Ureteroscopy With Lithotripsy 3. History of Surgery Spermatic Cord Excision Of Varicocele  Current Meds 1. Allopurinol 100 MG Oral Tablet; TAKE 1 TABLET DAILY AS DIRECTED;  Therapy: 05Aug2016 to (Evaluate:31Jul2017)  Requested for: 05Aug2016; Last  Rx:05Aug2016 Ordered 2. Diazepam 10 MG Oral Tablet; (1) TAB 30 MINUTES PRIOR TO PROCEDURE;  Therapy: 21Jul2016 to (Last Rx:21Jul2016) Ordered 3. Exforge TABS;  Therapy: (Recorded:03Jun2015) to Recorded 4. Ketorolac Tromethamine 60 MG/2ML Injection Solution; INJECT 60  MG Intramuscular;  To Be Done: 15Jul2016; Status: HOLD FOR - Administration Ordered 5. Levothyroxine Sodium TABS;  Therapy: (Recorded:03Jun2015) to Recorded 6. Meloxicam TABS;  Therapy: (Recorded:03Jun2015) to Recorded 7. Soma TABS;  Therapy: (Recorded:11May2009) to Recorded 8. Tamsulosin HCl - 0.4 MG Oral Capsule; TAKE 1 CAPSULE Bedtime;  Therapy: 15Jul2016 to (Last Rx:15Jul2016)  Requested for: 15Jul2016 Ordered 9. Valsartan-Hydrochlorothiazide 160-12.5 MG Oral Tablet; TAKE 1 TABLET DAILY;  Therapy: 05Aug2016 to (Evaluate:31Jul2017)  Requested for: 05Aug2016; Last  Rx:05Aug2016 Ordered 10. Viagra 100 MG Oral Tablet;  USE AS DIRECTED;    Therapy: 08Jan2013 to (Last Rx:08Jan2013)  Requested for: 08Jan2013 Ordered  Allergies Medication  1. No Known Drug Allergies  Family History Problems  1. Family history of Family Health Status - Mother's Age   30 2. Family history of Family Health Status Number Of Children   3 sons, 1 daughter 3. No pertinent family history : Mother  Social History Problems  1. Denied: History of Alcohol Use 2. Denied: History of Caffeine Use 3. Marital History - Currently Married 4. Never A Smoker 5. Occupation:   unemployed 6. Denied: History of Tobacco Use  Review of Systems Genitourinary, constitutional, skin, eye, otolaryngeal, hematologic/lymphatic, cardiovascular, pulmonary, endocrine, musculoskeletal, gastrointestinal, neurological and psychiatric system(s) were reviewed and pertinent findings if present are noted and are otherwise negative.  Genitourinary: hematuria.  Gastrointestinal: nausea, vomiting, flank pain and abdominal pain.    Vitals Vital Signs [Data Includes: Last 1 Day]  Recorded: 30Sep2016 10:29AM  Blood Pressure: 130 / 81 Temperature: 97.5 F Heart Rate: 64  Physical Exam Constitutional: Well nourished and well developed . No acute distress.  ENT:. The ears and nose are normal in appearance.  Neck: The appearance of the neck is normal and no neck mass is present.  Pulmonary: No respiratory distress and normal respiratory rhythm and effort.  Cardiovascular: Heart rate and rhythm are normal . No peripheral edema.  Abdomen: The abdomen is soft and nontender. No masses are palpated. No CVA tenderness. No hernias are palpable. No hepatosplenomegaly noted.  Rectal: Rectal exam demonstrates normal sphincter tone, no tenderness and no masses. The prostate has no nodularity and is not tender. The left seminal vesicle is nonpalpable. The right seminal vesicle is nonpalpable. The perineum is normal on inspection.  Genitourinary: Examination of the penis demonstrates no  discharge, no masses, no lesions and a normal meatus. The scrotum is without lesions. The right epididymis is palpably normal and non-tender. The left epididymis is palpably normal and non-tender. The right testis is non-tender and without masses. The left testis is non-tender and without masses.  Lymphatics: The femoral and inguinal nodes are not enlarged or tender.  Skin: Normal skin turgor, no visible rash and no visible skin lesions.  Neuro/Psych:. Mood and affect are appropriate.    Results/Data  Urine [Data Includes: Last 1 Day]   30Sep2016  COLOR YELLOW   APPEARANCE CLEAR   SPECIFIC GRAVITY 1.020   pH 6.0   GLUCOSE NEGATIVE   BILIRUBIN NEGATIVE   KETONE NEGATIVE   BLOOD 2+   PROTEIN NEGATIVE   NITRITE NEGATIVE   LEUKOCYTE ESTERASE TRACE   SQUAMOUS EPITHELIAL/HPF 0-5 HPF  WBC 6-10 WBC/HPF  RBC 20-40 RBC/HPF  BACTERIA NONE SEEN HPF  CRYSTALS NONE SEEN HPF  CASTS NONE SEEN LPF  Other    Yeast NONE SEEN HPF  Selected Results  AU CT-STONE PROTOCOL 14Sep2016 12:00AM Jethro Bolus   Test Name Result Flag Reference  CT-STONE PROTOCOL (Report)    ** RADIOLOGY REPORT BY [facility] **   CLINICAL DATA: Micro hematuria off, right ureteral stone.  EXAM: CT ABDOMEN AND PELVIS WITHOUT CONTRAST  TECHNIQUE: Multidetector CT imaging of the abdomen and pelvis was performed following the standard protocol without IV contrast.  COMPARISON: 03/05/2015.  FINDINGS: Lower chest: Visualized portions of the show no acute findings. No pericardial or pleural effusion.  Hepatobiliary: Liver and gallbladder are unremarkable. No biliary ductal dilatation.  Pancreas: Negative.  Spleen: Negative.  Adrenals/Urinary Tract: Adrenal glands are unremarkable. Several stones are seen  in the kidneys bilaterally. Hyper attenuating lesion in the upper pole left kidney measures 8 mm, too small to characterize. Additional low-attenuation lesions in the kidneys measure up to 11  mm in the lower pole left kidney, also too small to characterize. No ureteral stones. Ureters are decompressed. Bladder is unremarkable.  Stomach/Bowel: Stomach, small bowel, appendix and colon are unremarkable.  Vascular/Lymphatic: Vascular structures are unremarkable. No pathologically enlarged lymph nodes.  Reproductive: Prostate is normal in size.  Other: Small periumbilical hernia contains fat. Mesenteries and peritoneum are unremarkable. No free fluid. Right inguinal hernia appears to contain unobstructed small bowel.  Musculoskeletal: No worrisome lytic or sclerotic lesions.  IMPRESSION: 1. Bilateral renal stones. No ureteral stones or obstruction. 2. Small right inguinal hernia appears to contain unobstructed small bowel.   Electronically Signed  By: Leanna Battles M.D.  On: 04/13/2015 12:06    29 Apr 2015 9:56 AM  UA With REFLEX    COLOR YELLOW     APPEARANCE CLEAR     SPECIFIC GRAVITY 1.020     pH 6.0     GLUCOSE NEGATIVE     BILIRUBIN NEGATIVE     KETONE NEGATIVE     BLOOD 2+     PROTEIN NEGATIVE     NITRITE NEGATIVE     LEUKOCYTE ESTERASE TRACE     WBC 6-10     RBC 20-40     SQUAMOUS EPITHELIAL/HPF 0-5     BACTERIA NONE SEEN     CRYSTALS NONE SEEN     CASTS NONE SEEN     Yeast NONE SEEN     Other  Assessment Assessed  1. 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 Gross hematuria (R31.0) 3. 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 Hypogonadism, testicular (E29.1) 5. Incomplete bladder emptying (R33.9) 6. Left nephrolithiasis (N20.0) 7. Left ureteral stone (N20.1) 8. Microscopic hematuria (R31.29) 9. 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 Nephrolithiasis (N20.0)  Bilateral nephrolithiasis. Right side, he has 2 stones, in the mid renal pelvis and in the RLP. WE hasve measured the RLP stone , and it is 6mm, with 285 HU, and is 11.4cm from the skin edge; and the Right renal pelvic stone is 5mm , and is 12.2cm from the skin  edge and is 336 HU. We have discussed alternatives for removing his stones, including ureteroscopy and laser, and PN, as well as lithotripsy. The pat's wife is an N at the hospital, and has discussed alternatives with him also. He would like to have lithotripsy, understanding that his distance from stone to sidewall is somewhat long. He wouls like for me to perform his procedure and will wait until my next litho date in December for staged treatment.   Plan Ureteral obstruction  1. Administer: Ketorolac Tromethamine 60 MG/2ML Injection Solution; INJECT 60  MG  Intramuscular  lithotreatment.   Discussion/Summary cc: Dr. Dalia Heading Electronically signed by : Jethro Bolus, M.D.; Apr 29 2015 11:03AM EST

## 2015-06-30 NOTE — Interval H&P Note (Signed)
History and Physical Interval Note:  06/30/2015 8:36 AM  Nicholas Mcintosh  has presented today for surgery, with the diagnosis of RIGHT RENAL STONE  The various methods of treatment have been discussed with the patient and family. After consideration of risks, benefits and other options for treatment, the patient has consented to  Procedure(s): RIGHT EXTRACORPOREAL SHOCK WAVE LITHOTRIPSY (ESWL) (Right) as a surgical intervention .  The patient's history has been reviewed, patient examined, no change in status, stable for surgery.  I have reviewed the patient's chart and labs.  Questions were answered to the patient's satisfaction.   Right arm marked.   Aubrina Nieman I [place]

## 2015-07-19 ENCOUNTER — Other Ambulatory Visit (HOSPITAL_COMMUNITY): Payer: Self-pay | Admitting: Psychiatry

## 2015-07-19 DIAGNOSIS — F329 Major depressive disorder, single episode, unspecified: Secondary | ICD-10-CM

## 2015-07-19 DIAGNOSIS — F32A Depression, unspecified: Secondary | ICD-10-CM

## 2015-07-19 NOTE — Telephone Encounter (Signed)
Met with Dr. Lovena Le helping to cover for Dr. Adele Schilder out this week as he approved a new 90 day order for patient's prescribed Cymbalta.  A new order e-scribed to patient's [place] as approved and patient to keep next appointment on 08/02/15 with Dr. Adele Schilder.

## 2015-08-02 ENCOUNTER — Ambulatory Visit (HOSPITAL_COMMUNITY): Payer: Self-pay | Admitting: Psychiatry

## 2015-08-03 MED FILL — VIAGRA 100 MG TABLET: 100 | 30 days supply | Qty: 6 | Fill #0

## 2015-08-03 MED FILL — OXYCODONE/APAP 5/325MG: 5-325 | 30 days supply | Qty: 90 | Fill #0

## 2015-08-12 ENCOUNTER — Other Ambulatory Visit: Payer: Self-pay | Admitting: Family Medicine

## 2015-08-12 MED FILL — FOLIC ACID 1 MG TABLET: 1 | 30 days supply | Qty: 30 | Fill #0 | Status: TO

## 2015-08-12 MED FILL — DOXAZOSIN MESYLATE 4 MG TAB: 4 | 30 days supply | Qty: 30 | Fill #3

## 2015-08-22 ENCOUNTER — Encounter (HOSPITAL_COMMUNITY): Payer: Self-pay | Admitting: Psychiatry

## 2015-08-22 ENCOUNTER — Ambulatory Visit (INDEPENDENT_AMBULATORY_CARE_PROVIDER_SITE_OTHER): Payer: 59 | Admitting: Psychiatry

## 2015-08-22 VITALS — BP 152/83 | HR 76 | Ht 66.0 in | Wt 233.6 lb

## 2015-08-22 DIAGNOSIS — F329 Major depressive disorder, single episode, unspecified: Secondary | ICD-10-CM | POA: Diagnosis not present

## 2015-08-22 DIAGNOSIS — F411 Generalized anxiety disorder: Secondary | ICD-10-CM

## 2015-08-22 DIAGNOSIS — F32A Depression, unspecified: Secondary | ICD-10-CM

## 2015-08-22 MED ORDER — DULOXETINE HCL 60 MG PO CPEP: 60.0000 mg | ORAL_CAPSULE | Freq: Every day | ORAL | Status: DC

## 2015-08-22 NOTE — Progress Notes (Signed)
[facility] [id number] Progress Note  Nicholas Mcintosh [id number] 50 y.o.  08/22/2015 9:20 AM  Chief Complaint:  Medication management and follow-up.      History of Present Illness:  Nicholas Mcintosh came for his followup appointment.  He could not come on his last appointment because he was sick.  He had procedure for his kidney stone and he is relieved that it did go very well.  His pain is less intense from the past.  He was very anxious and nervous before the procedure but things are going very well.  He denies any major panic attack.  Denies any crying spells.  His anxiety and depression is under control.  He denies any irritability, feeling of hopelessness or worthlessness.  He has not seen Nicholas Mcintosh because he felt he does not needed.  He is taking Cymbalta is helping his symptoms.  He has no tremors or shakes.  He denies drinking or using any illegal substances.  His energy level is good.  His appetite is okay.  He lives with his wife who is very supportive.  Next month he is seeing Nicholas Mcintosh for his regular physical checkup at [facility] physician.  Suicidal Ideation: No Plan Formed: No Patient has means to carry out plan: No  Homicidal Ideation: No Plan Formed: No Patient has means to carry out plan: No   Review of Systems  Skin: Negative for itching and rash.  Neurological: Negative for headaches.  Psychiatric/Behavioral: Negative for suicidal ideas, hallucinations and substance abuse.    Psychiatric: Agitation: No Hallucination: No Depressed Mood: No Insomnia: No Hypersomnia: No Altered Concentration: No Feels Worthless: No Grandiose Ideas: No Belief In Special Powers: No New/Increased Substance Abuse: No Compulsions: No  Neurologic: Headache: Yes Seizure: No Paresthesias: No  Medical History;  Patient has history of scrotal injury, benign prostrate hypercalcemia, hypertension, asthma, kidney stone, chronic back pain, chronic fatigue, hypothyroidism and  dysfunction.  He is seeing Nicholas Mcintosh.  Patient has history of back surgery, head trauma and concussion but do not remember the details very well.  He is taking multiple pain medication from pain management.  Outpatient Encounter Prescriptions as of 08/22/2015  Medication Sig  . albuterol (PROVENTIL HFA;VENTOLIN HFA) 108 (90 BASE) MCG/ACT inhaler Inhale 2 puffs into the lungs every 4 (four) hours as needed for wheezing or shortness of breath.  . allopurinol (ZYLOPRIM) 100 MG tablet   . amLODipine-valsartan (EXFORGE) 5-320 MG per tablet Take 1 tablet by mouth daily.  Marland Kitchen atorvastatin (LIPITOR) 40 MG tablet Take 1 tablet (40 mg total) by mouth daily.  . calcium carbonate (TUMS - DOSED IN MG ELEMENTAL CALCIUM) 500 MG chewable tablet Chew 2 tablets by mouth daily as needed for indigestion or heartburn.  . docusate sodium (COLACE) 100 MG capsule Take 1 capsule (100 mg total) by mouth 2 (two) times daily.  Marland Kitchen doxazosin (CARDURA) 4 MG tablet Take 1 tablet (4 mg total) by mouth daily.  . DULoxetine (CYMBALTA) 60 MG capsule Take 1 capsule (60 mg total) by mouth daily.  . folic acid (FOLVITE) 1 MG tablet TAKE 1 TABLET BY MOUTH DAILY.  Marland Kitchen levothyroxine (SYNTHROID, LEVOTHROID) 50 MCG tablet TAKE 1 TABLET BY MOUTH ONCE DAILY  . Multiple Vitamins-Minerals (MEGAVITE FRUITS & VEGGIES) TABS Take 1 tablet by mouth daily.  Marland Kitchen oxybutynin (DITROPAN) 5 MG tablet Take 1 tablet (5 mg total) by mouth 3 (three) times daily as needed for bladder spasms.  Marland Kitchen oxyCODONE-acetaminophen (PERCOCET/ROXICET) 5-325 MG tablet   . senna (SENOKOT) 8.6  MG TABS tablet Take 1 tablet by mouth daily as needed for mild constipation or moderate constipation.  . sildenafil (VIAGRA) 100 MG tablet Take 1 tablet (100 mg total) by mouth daily as needed for erectile dysfunction.  . tamsulosin (FLOMAX) 0.4 MG CAPS capsule Take 1 capsule (0.4 mg total) by mouth at bedtime.  . [DISCONTINUED] DULoxetine (CYMBALTA) 60 MG capsule TAKE 1 CAPSULE BY MOUTH ONCE  DAILY  . [DISCONTINUED] oxyCODONE (OXY IR/ROXICODONE) 5 MG immediate release tablet Take 1-2 tablets (5-10 mg total) by mouth every 4 (four) hours as needed for severe pain.   No facility-administered encounter medications on file as of 08/22/2015.   No results found for this or any previous visit (from the past 2160 hour(s)).   Past Psychiatric History/Hospitalization(s) Anxiety: Yes Bipolar Disorder: No Depression: Yes Mania: No Psychosis: No Schizophrenia: No Personality Disorder: No Hospitalization for psychiatric illness: No History of Electroconvulsive Shock Therapy: No Prior Suicide Attempts: No  Physical Exam: Constitutional:  BP 152/83 mmHg  Pulse 76  Ht  (1.676 m)  Wt 233 lb 9.6 oz (105.96 kg)  BMI 37.72 kg/m2  Musculoskeletal: Strength & Muscle Tone: within normal limits Gait & Station: normal Patient leans: N/A  Mental Status Examination;  Patient is casually dressed and groomed.  He is pleasant and cooperative.  He maintained good eye contact.  He described mood euthymic and his affect is appropriate.  He denies any auditory or visual hallucination.  He denies any active or passive suicidal thoughts or homicidal thoughts.  His attention and concentration is fair.  His psychomotor activity is normal.  There were no delusions, paranoia or obsession present at this time.  There were no tremors or shakes present at this time.  His fund of knowledge is average.  He is alert and oriented x3.  His insight judgment and impulse control is okay.   Established Problem, Stable/Improving (1), Review of Last Therapy Session (1) and Review of Medication Regimen & Side Effects (2)  Assessment: Axis I: Depressive disorder NOS, mood disorder due to general medical condition  Axis II: Deferred  Axis III: see medical history  Plan:  Patient doing better on Cymbalta 60 mg daily.  He has no side effects.   He does not feel that he needed to see a therapist at this time.   Continue Cymbalta 60 mg daily.  Recommended to call us back if he has any question or any concern.  Follow-up in 3 months.  Gopal Malter T., MD 08/22/2015

## 2015-09-02 MED FILL — OXYCODONE/APAP 5/325MG: 5-325 | 30 days supply | Qty: 90 | Fill #0

## 2015-09-06 DIAGNOSIS — N200 Calculus of kidney: Secondary | ICD-10-CM | POA: Diagnosis not present

## 2015-09-06 DIAGNOSIS — Z0000 Encounter for general adult medical examination without abnormal findings: Secondary | ICD-10-CM | POA: Diagnosis not present

## 2015-09-06 MED FILL — AMLODIPINE-VALSARTAN 5-320: 5-320 | 30 days supply | Qty: 30 | Fill #2 | Status: TO

## 2015-09-22 ENCOUNTER — Ambulatory Visit (HOSPITAL_COMMUNITY): Payer: Self-pay | Admitting: Psychiatry

## 2015-11-16 DIAGNOSIS — H524 Presbyopia: Secondary | ICD-10-CM | POA: Diagnosis not present

## 2015-11-16 DIAGNOSIS — H5213 Myopia, bilateral: Secondary | ICD-10-CM | POA: Diagnosis not present

## 2015-11-16 DIAGNOSIS — H52223 Regular astigmatism, bilateral: Secondary | ICD-10-CM | POA: Diagnosis not present

## 2015-11-21 ENCOUNTER — Ambulatory Visit (HOSPITAL_COMMUNITY): Payer: Self-pay | Admitting: Psychiatry

## 2015-12-20 ENCOUNTER — Ambulatory Visit (HOSPITAL_COMMUNITY): Payer: Self-pay | Admitting: Psychiatry

## 2015-12-22 ENCOUNTER — Encounter (HOSPITAL_COMMUNITY): Payer: Self-pay | Admitting: Psychiatry

## 2015-12-22 ENCOUNTER — Ambulatory Visit (INDEPENDENT_AMBULATORY_CARE_PROVIDER_SITE_OTHER): Payer: 59 | Admitting: Psychiatry

## 2015-12-22 VITALS — BP 132/78 | HR 63 | Ht 67.0 in | Wt 236.0 lb

## 2015-12-22 DIAGNOSIS — F329 Major depressive disorder, single episode, unspecified: Secondary | ICD-10-CM | POA: Diagnosis not present

## 2015-12-22 DIAGNOSIS — F32A Depression, unspecified: Secondary | ICD-10-CM

## 2015-12-22 MED ORDER — DULOXETINE HCL 60 MG PO CPEP: 60.0000 mg | ORAL_CAPSULE | Freq: Every day | ORAL | Status: DC

## 2015-12-22 NOTE — Progress Notes (Signed)
[facility] [id number] Progress Note  Nicholas Mcintosh [id number] 50 y.o.  12/22/2015 9:02 AM  Chief Complaint:  Medication management and follow-up.      History of Present Illness:  Keeghan came for his followup appointment.  He is taking Cymbalta for sometime he feels it is not working.  He continues to have episodes of depression and frustration and crying spells.  He does not want to change his antidepressant.  He is unable to see [name] for past few months and he is frustrated.  [name] schedule has been change a few times and he does not want to start therapy with the new person.  He sleeping okay.  He continues to ruminate about his sexual performance.  Patient believe he had a rectal injury when he was an [employer] and that cause him unable to perform sexually.  He has seen a urologist but he was told there is no cure .  Patient also had kidney stone and he's glad that has not any episode in past few months.  He is tolerating Cymbalta.  He denies any tremors, shakes, irritability.  He denies any suicidal thoughts or homicidal thought.  He still have some time hopelessness but he is sleeping good.  His energy level is fair.  His appetite is okay.  He is scheduled to see his primary care physician on seventh .  Patient denies drinking alcohol or using any illegal substances.  His vitals are stable.  Suicidal Ideation: No Plan Formed: No Patient has means to carry out plan: No  Homicidal Ideation: No Plan Formed: No Patient has means to carry out plan: No   Review of Systems  Skin: Negative for itching and rash.  Neurological: Negative for headaches.  Psychiatric/Behavioral: Negative for suicidal ideas, hallucinations and substance abuse.    Psychiatric: Agitation: No Hallucination: No Depressed Mood: No Insomnia: No Hypersomnia: No Altered Concentration: No Feels Worthless: No Grandiose Ideas: No Belief In Special Powers: No New/Increased Substance Abuse:  No Compulsions: No  Neurologic: Headache: Yes Seizure: No Paresthesias: No  Medical History;  Patient has history of scrotal injury, benign prostrate hypercalcemia, hypertension, asthma, kidney stone, chronic back pain, chronic fatigue, hypothyroidism and dysfunction.  He is seeing Dr. Nickolas MadridHossain.  Patient has history of back surgery, head trauma and concussion but do not remember the details very well.  He is taking multiple pain medication from pain management.  Outpatient Encounter Prescriptions as of 12/22/2015  Medication Sig  . albuterol (PROVENTIL HFA;VENTOLIN HFA) 108 (90 BASE) MCG/ACT inhaler Inhale 2 puffs into the lungs every 4 (four) hours as needed for wheezing or shortness of breath.  . allopurinol (ZYLOPRIM) 100 MG tablet   . amLODipine-valsartan (EXFORGE) 5-320 MG per tablet Take 1 tablet by mouth daily.  Marland Kitchen. atorvastatin (LIPITOR) 40 MG tablet Take 1 tablet (40 mg total) by mouth daily.  . calcium carbonate (TUMS - DOSED IN MG ELEMENTAL CALCIUM) 500 MG chewable tablet Chew 2 tablets by mouth daily as needed for indigestion or heartburn.  . docusate sodium (COLACE) 100 MG capsule Take 1 capsule (100 mg total) by mouth 2 (two) times daily.  Marland Kitchen. doxazosin (CARDURA) 4 MG tablet Take 1 tablet (4 mg total) by mouth daily.  . DULoxetine (CYMBALTA) 60 MG capsule Take 1 capsule (60 mg total) by mouth daily.  . folic acid (FOLVITE) 1 MG tablet TAKE 1 TABLET BY MOUTH DAILY.  Marland Kitchen. levothyroxine (SYNTHROID, LEVOTHROID) 50 MCG tablet TAKE 1 TABLET BY MOUTH ONCE DAILY  . Multiple  Vitamins-Minerals (MEGAVITE FRUITS & VEGGIES) TABS Take 1 tablet by mouth daily.  Marland Kitchen oxybutynin (DITROPAN) 5 MG tablet Take 1 tablet (5 mg total) by mouth 3 (three) times daily as needed for bladder spasms.  Marland Kitchen oxyCODONE-acetaminophen (PERCOCET/ROXICET) 5-325 MG tablet   . senna (SENOKOT) 8.6 MG TABS tablet Take 1 tablet by mouth daily as needed for mild constipation or moderate constipation.  . sildenafil (VIAGRA) 100 MG  tablet Take 1 tablet (100 mg total) by mouth daily as needed for erectile dysfunction.  . tamsulosin (FLOMAX) 0.4 MG CAPS capsule Take 1 capsule (0.4 mg total) by mouth at bedtime.  . [DISCONTINUED] DULoxetine (CYMBALTA) 60 MG capsule Take 1 capsule (60 mg total) by mouth daily.   No facility-administered encounter medications on file as of 12/22/2015.   No results found for this or any previous visit (from the past 2160 hour(s)).   Past Psychiatric History/Hospitalization(s) Anxiety: Yes Bipolar Disorder: No Depression: Yes Mania: No Psychosis: No Schizophrenia: No Personality Disorder: No Hospitalization for psychiatric illness: No History of Electroconvulsive Shock Therapy: No Prior Suicide Attempts: No  Physical Exam: Constitutional:  BP 132/78 mmHg  Pulse 63  Ht  (1.702 m)  Wt 236 lb (107.049 kg)  BMI 36.95 kg/m2  Musculoskeletal: Strength & Muscle Tone: within normal limits Gait & Station: normal Patient leans: N/A  Mental Status Examination;  Patient is casually dressed and groomed.  He is Cooperative and maintained fair eye contact.  He described his mood sad and his affect is mood appropriate.  His speech is slow but clear coherent. He denies any auditory or visual hallucination.  He denies any active or passive suicidal thoughts or homicidal thoughts.  His attention and concentration is fair.  His psychomotor activity is normal.  There were no delusions, paranoia or obsession present at this time.  There were no tremors or shakes present at this time.  His fund of knowledge is average.  He is alert and oriented x3.  His insight judgment and impulse control is okay.   Established Problem, Stable/Improving (1), Review of Last Therapy Session (1) and Review of Medication Regimen & Side Effects (2)  Assessment: Axis I: Depressive disorder NOS, mood disorder due to general medical condition  Axis II: Deferred  Axis III: see medical history  Plan:  I offered a  different counselor but patient does not want to start counseling with a new person.  He promised that he will call me if symptoms started to get worse.  He wants to continue Cymbalta 60 mg daily.  He has no side effects.  I will call prescription for 90 days to optimum Rx.  Recommended to call us back if he has any question or any concern.  Follow-up in 3 months.  Discuss safety plan that anytime having active suicidal thoughts or homicidal thought but he need to call 911 or go to local emergency room.  ARFEEN,SYED T., MD 12/22/2015

## 2015-12-27 ENCOUNTER — Ambulatory Visit (HOSPITAL_COMMUNITY)
Admission: EM | Admit: 2015-12-27 | Discharge: 2015-12-27 | Disposition: A | Discharge status: Home / Self Care | Payer: 59 | Attending: Family Medicine | Admitting: Family Medicine

## 2015-12-27 ENCOUNTER — Encounter (HOSPITAL_COMMUNITY): Payer: Self-pay | Admitting: Emergency Medicine

## 2015-12-27 DIAGNOSIS — J4531 Mild persistent asthma with (acute) exacerbation: Secondary | ICD-10-CM | POA: Diagnosis not present

## 2015-12-27 MED ORDER — ALBUTEROL SULFATE (2.5 MG/3ML) 0.083% IN NEBU
5.0000 mg | INHALATION_SOLUTION | Freq: Once | RESPIRATORY_TRACT | Status: AC
  Administered 2015-12-27: 5 mg via RESPIRATORY_TRACT

## 2015-12-27 MED ORDER — METHYLPREDNISOLONE SODIUM SUCC 125 MG IJ SOLR
INTRAMUSCULAR | Status: AC
  Filled 2015-12-27: qty 2

## 2015-12-27 MED ORDER — METHYLPREDNISOLONE SODIUM SUCC 125 MG IJ SOLR
125.0000 mg | Freq: Once | INTRAMUSCULAR | Status: AC
  Administered 2015-12-27: 125 mg via INTRAMUSCULAR

## 2015-12-27 MED ORDER — METHYLPREDNISOLONE 4 MG PO TBPK: ORAL_TABLET | ORAL | Status: DC

## 2015-12-27 MED ORDER — ALBUTEROL SULFATE (2.5 MG/3ML) 0.083% IN NEBU
INHALATION_SOLUTION | RESPIRATORY_TRACT | Status: AC
  Filled 2015-12-27: qty 6

## 2015-12-27 MED ORDER — IPRATROPIUM BROMIDE 0.02 % IN SOLN
RESPIRATORY_TRACT | Status: AC
  Filled 2015-12-27: qty 2.5

## 2015-12-27 MED ORDER — IPRATROPIUM BROMIDE 0.02 % IN SOLN
0.5000 mg | Freq: Once | RESPIRATORY_TRACT | Status: AC
  Administered 2015-12-27: 0.5 mg via RESPIRATORY_TRACT

## 2015-12-27 NOTE — ED Notes (Signed)
Pt has tried his nebulizer at home 4 times today with no relief.  He states when this happens he usually needs "two shots and a breathing treatment here" at our facility to help him out.

## 2015-12-27 NOTE — Discharge Instructions (Signed)
Use medicine as prescribed, see your doctor as planned. °

## 2015-12-27 NOTE — ED Provider Notes (Signed)
CSN: [id number]     Arrival date & time 12/27/15  1455 History   First MD Initiated Contact with Patient 12/27/15 1542     Chief Complaint  Patient presents with  . Asthma   (Consider location/radiation/quality/duration/timing/severity/associated sxs/prior Treatment) Patient is a 50 y.o. male presenting with asthma. The history is provided by the patient.  Asthma This is a chronic problem. The current episode started 6 to 12 hours ago. The problem has been gradually worsening. Associated symptoms include shortness of breath. Pertinent negatives include no chest pain and no abdominal pain.    Past Medical History  Diagnosis Date  . Hypertension   . Hypothyroidism   . Complication of anesthesia   . PONV (postoperative nausea and vomiting)   . Exercise-induced asthma   . Chronic back pain     hx fall 2006 from 3 stories , vertebral fx  . History of concussion     2006 from fall  . Nephrolithiasis     bilateral  . Right ureteral stone   . History of kidney stones   . ED (erectile dysfunction)    Past Surgical History  Procedure Laterality Date  . Cystoscopy/retrograde/ureteroscopy/stone extraction with basket Left 02/15/2015    Procedure: CYSTOSCOPY/RETROGRADE/URETEROSCOPY/STONE EXTRACTION WITH BASKET;  Surgeon: Jethro BolusSigmund Tannenbaum, MD;  Location: [facility];  Service: Urology;  Laterality: Left;  . Cystoscopy with holmium laser lithotripsy Left 02/15/2015    Procedure: CYSTOSCOPY WITH HOLMIUM LASER LITHOTRIPSY;  Surgeon: Jethro BolusSigmund Tannenbaum, MD;  Location: [facility];  Service: Urology;  Laterality: Left;  . Cystoscopy with stent placement Left 02/15/2015    Procedure: CYSTOSCOPY WITH STENT PLACEMENT LEFT URETER;  Surgeon: Jethro BolusSigmund Tannenbaum, MD;  Location: [facility];  Service: Urology;  Laterality: Left;  . Cardiac catheterization  07-12-2011   dr Sharyn Lullharwani    abnormal myoview/  normal coronary arteries and normal LVSF  . Cardiovascular stress test  06-15-2011   dr Sharyn Lullharwani    mild reversible  periinfarct anterior wall ischemia, normal wall motion , ef 49%  . Varicocelectomy  2001 ?   Family History  Problem Relation Age of Onset  . Hypertension Mother   . Cancer Maternal Aunt     Breast   Social History  Substance Use Topics  . Smoking status: Never Smoker   . Smokeless tobacco: Never Used  . Alcohol Use: No    Review of Systems  Constitutional: Negative.   HENT: Negative.   Respiratory: Positive for shortness of breath and wheezing.   Cardiovascular: Negative.  Negative for chest pain.  Gastrointestinal: Negative for abdominal pain.  All other systems reviewed and are negative.   Allergies  Molds & smuts and Dust mite extract  Home Medications   Prior to Admission medications   Medication Sig Start Date End Date Taking? Authorizing Provider  amLODipine-valsartan (EXFORGE) 5-320 MG per tablet Take 1 tablet by mouth daily. 02/10/15  Yes Henrietta HooverLinda C Bernhardt, NP  doxazosin (CARDURA) 4 MG tablet Take 1 tablet (4 mg total) by mouth daily. 02/10/15  Yes Henrietta HooverLinda C Bernhardt, NP  oxyCODONE-acetaminophen (PERCOCET/ROXICET) 5-325 MG tablet  08/03/15  Yes Historical Provider, MD  albuterol (PROVENTIL HFA;VENTOLIN HFA) 108 (90 BASE) MCG/ACT inhaler Inhale 2 puffs into the lungs every 4 (four) hours as needed for wheezing or shortness of breath. 05/01/14   Cathren LaineKevin Steinl, MD  allopurinol (ZYLOPRIM) 100 MG tablet  04/20/15   Historical Provider, MD  atorvastatin (LIPITOR) 40 MG tablet Take 1 tablet (40 mg total) by mouth daily. 01/03/15   Marcelino DusterMichelle  Neita Garnet, MD  calcium carbonate (TUMS - DOSED IN MG ELEMENTAL CALCIUM) 500 MG chewable tablet Chew 2 tablets by mouth daily as needed for indigestion or heartburn.    Historical Provider, MD  docusate sodium (COLACE) 100 MG capsule Take 1 capsule (100 mg total) by mouth 2 (two) times daily. 02/15/15   Loura Pardon, MD  DULoxetine (CYMBALTA) 60 MG capsule Take 1 capsule (60 mg total) by mouth daily. 12/22/15   Cleotis Nipper, MD  folic acid  (FOLVITE) 1 MG tablet TAKE 1 TABLET BY MOUTH DAILY. 08/12/15   Henrietta Hoover, NP  levothyroxine (SYNTHROID, LEVOTHROID) 50 MCG tablet TAKE 1 TABLET BY MOUTH ONCE DAILY 05/13/15   Henrietta Hoover, NP  methylPREDNISolone (MEDROL DOSEPAK) 4 MG TBPK tablet follow package directions, start on wed, take until finished. 12/27/15   Linna Hoff, MD  Multiple Vitamins-Minerals (MEGAVITE FRUITS & VEGGIES) TABS Take 1 tablet by mouth daily.    Historical Provider, MD  oxybutynin (DITROPAN) 5 MG tablet Take 1 tablet (5 mg total) by mouth 3 (three) times daily as needed for bladder spasms. 02/15/15   Loura Pardon, MD  senna (SENOKOT) 8.6 MG TABS tablet Take 1 tablet by mouth daily as needed for mild constipation or moderate constipation.    Historical Provider, MD  sildenafil (VIAGRA) 100 MG tablet Take 1 tablet (100 mg total) by mouth daily as needed for erectile dysfunction. 03/10/14   Altha Harm, MD  tamsulosin (FLOMAX) 0.4 MG CAPS capsule Take 1 capsule (0.4 mg total) by mouth at bedtime. 02/15/15   Loura Pardon, MD   Meds Ordered and Administered this Visit   Medications  albuterol (PROVENTIL) (2.5 MG/3ML) 0.083% nebulizer solution 5 mg (5 mg Nebulization Given 12/27/15 1621)  ipratropium (ATROVENT) nebulizer solution 0.5 mg (0.5 mg Nebulization Given 12/27/15 1621)  methylPREDNISolone sodium succinate (SOLU-MEDROL) 125 mg/2 mL injection 125 mg (125 mg Intramuscular Given 12/27/15 1619)  albuterol (PROVENTIL) (2.5 MG/3ML) 0.083% nebulizer solution 5 mg (5 mg Nebulization Given 12/27/15 1714)    BP 149/78 mmHg  Pulse 65  Temp(Src) 98.2 F (36.8 C) (Oral)  Resp 16  SpO2 95% No data found.   Physical Exam  Constitutional: He is oriented to person, place, and time. He appears well-developed and well-nourished. No distress.  HENT:  Mouth/Throat: Oropharynx is clear and moist.  Neck: Normal range of motion. Neck supple.  Cardiovascular: Normal rate and normal heart sounds.    Pulmonary/Chest: Effort normal. He has wheezes. He has no rales.  Lymphadenopathy:    He has no cervical adenopathy.  Neurological: He is alert and oriented to person, place, and time.  Skin: Skin is warm and dry.  Nursing note and vitals reviewed.   ED Course  Procedures (including critical care time)  Labs Review Labs Reviewed - No data to display  Imaging Review No results found.   Visual Acuity Review  Right Eye Distance:   Left Eye Distance:   Bilateral Distance:    Right Eye Near:   Left Eye Near:    Bilateral Near:         MDM   1. Asthma exacerbation attacks, mild persistent    Sx improved after 1st neb but peak flow only 120.,wheezes still audible. After 2nd peak flow was 320, and lungs clear, asthma 'signif'' improved. Smiling and happy.    Linna Hoff, MD 12/27/15 [phone]

## 2016-01-11 DIAGNOSIS — N200 Calculus of kidney: Secondary | ICD-10-CM | POA: Diagnosis not present

## 2016-01-24 DIAGNOSIS — Z1211 Encounter for screening for malignant neoplasm of colon: Secondary | ICD-10-CM | POA: Diagnosis not present

## 2016-01-24 DIAGNOSIS — N529 Male erectile dysfunction, unspecified: Secondary | ICD-10-CM | POA: Diagnosis not present

## 2016-01-24 DIAGNOSIS — E291 Testicular hypofunction: Secondary | ICD-10-CM | POA: Diagnosis not present

## 2016-01-24 DIAGNOSIS — E039 Hypothyroidism, unspecified: Secondary | ICD-10-CM | POA: Diagnosis not present

## 2016-01-24 DIAGNOSIS — F324 Major depressive disorder, single episode, in partial remission: Secondary | ICD-10-CM | POA: Diagnosis not present

## 2016-01-24 DIAGNOSIS — I10 Essential (primary) hypertension: Secondary | ICD-10-CM | POA: Diagnosis not present

## 2016-01-24 DIAGNOSIS — G8929 Other chronic pain: Secondary | ICD-10-CM | POA: Diagnosis not present

## 2016-02-01 DIAGNOSIS — E785 Hyperlipidemia, unspecified: Secondary | ICD-10-CM | POA: Diagnosis not present

## 2016-02-01 DIAGNOSIS — I2510 Atherosclerotic heart disease of native coronary artery without angina pectoris: Secondary | ICD-10-CM | POA: Diagnosis not present

## 2016-02-01 DIAGNOSIS — E039 Hypothyroidism, unspecified: Secondary | ICD-10-CM | POA: Diagnosis not present

## 2016-02-01 DIAGNOSIS — I10 Essential (primary) hypertension: Secondary | ICD-10-CM | POA: Diagnosis not present

## 2016-02-08 ENCOUNTER — Ambulatory Visit: Payer: Self-pay | Admitting: Internal Medicine

## 2016-03-19 ENCOUNTER — Other Ambulatory Visit (HOSPITAL_COMMUNITY): Payer: Self-pay

## 2016-03-19 DIAGNOSIS — F329 Major depressive disorder, single episode, unspecified: Secondary | ICD-10-CM

## 2016-03-19 DIAGNOSIS — F32A Depression, unspecified: Secondary | ICD-10-CM

## 2016-03-19 MED ORDER — DULOXETINE HCL 60 MG PO CPEP: 60.0000 mg | ORAL_CAPSULE | Freq: Every day | ORAL | 0 refills | Status: DC

## 2016-03-26 ENCOUNTER — Ambulatory Visit (HOSPITAL_COMMUNITY): Payer: Self-pay | Admitting: Psychiatry

## 2016-05-10 ENCOUNTER — Ambulatory Visit (HOSPITAL_COMMUNITY): Payer: Self-pay | Admitting: Psychiatry

## 2016-05-12 ENCOUNTER — Other Ambulatory Visit (HOSPITAL_COMMUNITY): Payer: Self-pay | Admitting: Psychiatry

## 2016-05-12 DIAGNOSIS — F329 Major depressive disorder, single episode, unspecified: Secondary | ICD-10-CM

## 2016-05-12 DIAGNOSIS — F32A Depression, unspecified: Secondary | ICD-10-CM

## 2016-05-22 DIAGNOSIS — E785 Hyperlipidemia, unspecified: Secondary | ICD-10-CM | POA: Diagnosis not present

## 2016-05-22 DIAGNOSIS — I10 Essential (primary) hypertension: Secondary | ICD-10-CM | POA: Diagnosis not present

## 2016-05-22 DIAGNOSIS — I2510 Atherosclerotic heart disease of native coronary artery without angina pectoris: Secondary | ICD-10-CM | POA: Diagnosis not present

## 2016-05-22 DIAGNOSIS — E039 Hypothyroidism, unspecified: Secondary | ICD-10-CM | POA: Diagnosis not present

## 2016-05-31 DIAGNOSIS — Z23 Encounter for immunization: Secondary | ICD-10-CM | POA: Diagnosis not present

## 2016-07-09 DIAGNOSIS — F324 Major depressive disorder, single episode, in partial remission: Secondary | ICD-10-CM | POA: Diagnosis not present

## 2016-07-09 DIAGNOSIS — J309 Allergic rhinitis, unspecified: Secondary | ICD-10-CM | POA: Diagnosis not present

## 2016-07-09 DIAGNOSIS — I10 Essential (primary) hypertension: Secondary | ICD-10-CM | POA: Diagnosis not present

## 2016-07-09 DIAGNOSIS — E039 Hypothyroidism, unspecified: Secondary | ICD-10-CM | POA: Diagnosis not present

## 2016-07-09 DIAGNOSIS — E291 Testicular hypofunction: Secondary | ICD-10-CM | POA: Diagnosis not present

## 2016-07-09 DIAGNOSIS — Z1389 Encounter for screening for other disorder: Secondary | ICD-10-CM | POA: Diagnosis not present

## 2016-07-09 DIAGNOSIS — G8929 Other chronic pain: Secondary | ICD-10-CM | POA: Diagnosis not present

## 2016-07-09 DIAGNOSIS — N200 Calculus of kidney: Secondary | ICD-10-CM | POA: Diagnosis not present

## 2016-07-09 DIAGNOSIS — Z0000 Encounter for general adult medical examination without abnormal findings: Secondary | ICD-10-CM | POA: Diagnosis not present

## 2016-07-25 DIAGNOSIS — N201 Calculus of ureter: Secondary | ICD-10-CM | POA: Diagnosis not present

## 2016-07-25 DIAGNOSIS — E291 Testicular hypofunction: Secondary | ICD-10-CM | POA: Diagnosis not present

## 2016-07-26 DIAGNOSIS — E291 Testicular hypofunction: Secondary | ICD-10-CM | POA: Diagnosis not present

## 2016-07-27 DIAGNOSIS — E291 Testicular hypofunction: Secondary | ICD-10-CM | POA: Diagnosis not present

## 2016-08-08 ENCOUNTER — Other Ambulatory Visit: Payer: Self-pay | Admitting: Gastroenterology

## 2016-08-09 ENCOUNTER — Ambulatory Visit (HOSPITAL_COMMUNITY): Payer: Self-pay | Admitting: Psychiatry

## 2016-08-09 DIAGNOSIS — R7301 Impaired fasting glucose: Secondary | ICD-10-CM | POA: Diagnosis not present

## 2016-08-09 MED FILL — ANASTROZOLE 1 MG TABLET: 1 | 90 days supply | Qty: 90 | Fill #0

## 2016-08-17 ENCOUNTER — Other Ambulatory Visit (HOSPITAL_COMMUNITY): Payer: Self-pay

## 2016-08-17 DIAGNOSIS — F3289 Other specified depressive episodes: Secondary | ICD-10-CM

## 2016-08-17 MED ORDER — DULOXETINE HCL 60 MG PO CPEP: 60.0000 mg | ORAL_CAPSULE | Freq: Every day | ORAL | 0 refills | Status: DC

## 2016-08-17 MED FILL — DULoxetine HCL 60 MG CPEP: 60 | 90 days supply | Qty: 90 | Fill #0

## 2016-08-29 DIAGNOSIS — H04123 Dry eye syndrome of bilateral lacrimal glands: Secondary | ICD-10-CM | POA: Diagnosis not present

## 2016-09-07 ENCOUNTER — Ambulatory Visit (HOSPITAL_COMMUNITY): Payer: Self-pay | Admitting: Psychiatry

## 2016-09-18 ENCOUNTER — Encounter (HOSPITAL_COMMUNITY): Payer: Self-pay | Admitting: Psychiatry

## 2016-09-18 ENCOUNTER — Ambulatory Visit (INDEPENDENT_AMBULATORY_CARE_PROVIDER_SITE_OTHER): Payer: 59 | Admitting: Psychiatry

## 2016-09-18 VITALS — BP 126/74 | HR 74 | Ht 66.5 in | Wt 228.2 lb

## 2016-09-18 DIAGNOSIS — Z8249 Family history of ischemic heart disease and other diseases of the circulatory system: Secondary | ICD-10-CM

## 2016-09-18 DIAGNOSIS — F3289 Other specified depressive episodes: Secondary | ICD-10-CM | POA: Diagnosis not present

## 2016-09-18 DIAGNOSIS — Z9889 Other specified postprocedural states: Secondary | ICD-10-CM | POA: Diagnosis not present

## 2016-09-18 DIAGNOSIS — F330 Major depressive disorder, recurrent, mild: Secondary | ICD-10-CM | POA: Diagnosis not present

## 2016-09-18 DIAGNOSIS — Z79899 Other long term (current) drug therapy: Secondary | ICD-10-CM

## 2016-09-18 DIAGNOSIS — F411 Generalized anxiety disorder: Secondary | ICD-10-CM

## 2016-09-18 DIAGNOSIS — Z803 Family history of malignant neoplasm of breast: Secondary | ICD-10-CM

## 2016-09-18 DIAGNOSIS — Z9109 Other allergy status, other than to drugs and biological substances: Secondary | ICD-10-CM

## 2016-09-18 MED ORDER — DULOXETINE HCL 60 MG PO CPEP: 60.0000 mg | ORAL_CAPSULE | Freq: Every day | ORAL | 0 refills | Status: DC

## 2016-09-18 MED ORDER — BUPROPION HCL 100 MG PO TABS: 100.0000 mg | ORAL_TABLET | ORAL | 0 refills | Status: DC

## 2016-09-18 NOTE — Progress Notes (Signed)
[facility] MD/PA/NP OP Progress Note  09/18/2016 4:08 PM Nicholas Mcintosh  MRN:  [id number]  Chief Complaint:  Subjective:  I still feel anxious and I have no energy.  HPI: Nicholas Mcintosh came for his follow-up appointment.  He is taking Cymbalta 60 mg but he still feel sometimes anxious, tired, frustrated and having crying spells.  He has lack of energy and he ruminates a lot.  He recently seen his urology but there were no major changes.  He continues to regret about his sexual performance.  He sleeping okay.  He denies any suicidal thoughts or homicidal thoughts but admitted at times hopeless and helpless.  His energy level is fair.  He has no tremors or shakes.  He denies any paranoia or any hallucination.  He lives with his wife who is supportive but he regret that he has no intimacy because of rectal injury when he was in [employer].  He denies any aggressive behavior.  He denies drinking alcohol or using any illegal substances.  Visit Diagnosis:    ICD-9-CM ICD-10-CM   1. Generalized anxiety disorder 300.02 F41.1 DULoxetine (CYMBALTA) 60 MG capsule     buPROPion (WELLBUTRIN) 100 MG tablet  2. Other depression 311 F32.89     Past Psychiatric History: Reviewed.  Patient had never tried any other antidepressant other than Cymbalta.  Past Medical History:  Past Medical History:  Diagnosis Date  . Chronic back pain    hx fall 2006 from 3 stories , vertebral fx  . Complication of anesthesia   . ED (erectile dysfunction)   . Exercise-induced asthma   . History of concussion    2006 from fall  . History of kidney stones   . Hypertension   . Hypothyroidism   . Nephrolithiasis    bilateral  . PONV (postoperative nausea and vomiting)   . Right ureteral stone     Past Surgical History:  Procedure Laterality Date  . CARDIAC CATHETERIZATION  07-12-2011   dr Sharyn Lullharwani   abnormal myoview/  normal coronary arteries and normal LVSF  . CARDIOVASCULAR STRESS TEST  06-15-2011   dr Sharyn Lullharwani   mild reversible  periinfarct anterior wall ischemia, normal wall motion , ef 49%  . CYSTOSCOPY WITH HOLMIUM LASER LITHOTRIPSY Left 02/15/2015   Procedure: CYSTOSCOPY WITH HOLMIUM LASER LITHOTRIPSY;  Surgeon: Jethro BolusSigmund Tannenbaum, MD;  Location: [facility];  Service: Urology;  Laterality: Left;  . CYSTOSCOPY WITH STENT PLACEMENT Left 02/15/2015   Procedure: CYSTOSCOPY WITH STENT PLACEMENT LEFT URETER;  Surgeon: Jethro BolusSigmund Tannenbaum, MD;  Location: [facility];  Service: Urology;  Laterality: Left;  . CYSTOSCOPY/RETROGRADE/URETEROSCOPY/STONE EXTRACTION WITH BASKET Left 02/15/2015   Procedure: CYSTOSCOPY/RETROGRADE/URETEROSCOPY/STONE EXTRACTION WITH BASKET;  Surgeon: Jethro BolusSigmund Tannenbaum, MD;  Location: [facility];  Service: Urology;  Laterality: Left;  Marland Kitchen. VARICOCELECTOMY  2001 ?    Family Psychiatric History: Reviewed.  Family History:  Family History  Problem Relation Age of Onset  . Hypertension Mother   . Cancer Maternal Aunt     Breast    Social History:  Social History   Social History  . Marital status: Married    Spouse name: N/A  . Number of children: N/A  . Years of education: N/A   Social History Main Topics  . Smoking status: Never Smoker  . Smokeless tobacco: Never Used  . Alcohol use No  . Drug use: No  . Sexual activity: No   Other Topics Concern  . None   Social History Narrative  . None    Allergies:  Allergies  Allergen Reactions  . Molds & Smuts     Asthma attacks around molds  . Dust Mite Extract Other (See Comments)    Ashtma    Metabolic Disorder Labs: Lab Results  Component Value Date   HGBA1C 5.6 04/15/2014   MPG 114 04/15/2014   MPG 105 01/11/2014   Lab Results  Component Value Date   PROLACTIN 4.7 04/28/2014   Lab Results  Component Value Date   CHOL 126 02/10/2015   TRIG 152 (H) 02/10/2015   HDL 36 (L) 02/10/2015   CHOLHDL 3.5 02/10/2015   VLDL 30 02/10/2015   LDLCALC 60 02/10/2015   LDLCALC 59 04/15/2014     Current Medications: Current Outpatient  Prescriptions  Medication Sig Dispense Refill  . albuterol (PROVENTIL HFA;VENTOLIN HFA) 108 (90 BASE) MCG/ACT inhaler Inhale 2 puffs into the lungs every 4 (four) hours as needed for wheezing or shortness of breath. 1 Inhaler 3  . allopurinol (ZYLOPRIM) 100 MG tablet   11  . amLODipine-valsartan (EXFORGE) 5-320 MG per tablet Take 1 tablet by mouth daily. 30 tablet 2  . anastrozole (ARIMIDEX) 1 MG tablet Take 1 mg by mouth daily.  3  . atorvastatin (LIPITOR) 40 MG tablet Take 1 tablet (40 mg total) by mouth daily. 90 tablet 3  . calcium carbonate (TUMS - DOSED IN MG ELEMENTAL CALCIUM) 500 MG chewable tablet Chew 2 tablets by mouth daily as needed for indigestion or heartburn.    . docusate sodium (COLACE) 100 MG capsule Take 1 capsule (100 mg total) by mouth 2 (two) times daily. 30 capsule 0  . doxazosin (CARDURA) 4 MG tablet Take 1 tablet (4 mg total) by mouth daily. 30 tablet 3  . DULoxetine (CYMBALTA) 60 MG capsule Take 1 capsule (60 mg total) by mouth daily. 90 capsule 0  . folic acid (FOLVITE) 1 MG tablet TAKE 1 TABLET BY MOUTH DAILY. 30 tablet 3  . levothyroxine (SYNTHROID, LEVOTHROID) 50 MCG tablet TAKE 1 TABLET BY MOUTH ONCE DAILY 90 tablet 0  . methylPREDNISolone (MEDROL DOSEPAK) 4 MG TBPK tablet follow package directions, start on wed, take until finished. 21 tablet 0  . Multiple Vitamins-Minerals (MEGAVITE FRUITS & VEGGIES) TABS Take 1 tablet by mouth daily.    Marland Kitchen oxybutynin (DITROPAN) 5 MG tablet Take 1 tablet (5 mg total) by mouth 3 (three) times daily as needed for bladder spasms. 30 tablet 0  . oxyCODONE-acetaminophen (PERCOCET/ROXICET) 5-325 MG tablet   0  . senna (SENOKOT) 8.6 MG TABS tablet Take 1 tablet by mouth daily as needed for mild constipation or moderate constipation.    . sildenafil (VIAGRA) 100 MG tablet Take 1 tablet (100 mg total) by mouth daily as needed for erectile dysfunction. 10 tablet 0  . tamsulosin (FLOMAX) 0.4 MG CAPS capsule Take 1 capsule (0.4 mg total) by  mouth at bedtime. 14 capsule 0   No current facility-administered medications for this visit.     Neurologic: Headache: No Seizure: No Paresthesias: No  Musculoskeletal: Strength & Muscle Tone: within normal limits Gait & Station: normal Patient leans: N/A  Psychiatric Specialty Exam: Review of Systems  Constitutional: Negative.   HENT: Negative.   Musculoskeletal: Negative.   Skin: Negative.   Neurological: Negative.   Psychiatric/Behavioral: Positive for depression. The patient is nervous/anxious.     Blood pressure 126/74, pulse 74, height 5' 6.5" (1.689 m), weight 228 lb 3.2 oz (103.5 kg).Body mass index is 36.28 kg/m.  General Appearance: Casual  Eye Contact:  Good  Speech:  Slow  Volume:  Decreased  Mood:  Anxious  Affect:  Constricted  Thought Process:  Goal Directed  Orientation:  Full (Time, Place, and Person)  Thought Content: WDL, Logical and Rumination   Suicidal Thoughts:  No  Homicidal Thoughts:  No  Memory:  Immediate;   Good Recent;   Good Remote;   Good  Judgement:  Good  Insight:  Good  Psychomotor Activity:  Normal  Concentration:  Concentration: Fair and Attention Span: Fair  Recall:  Good  Fund of Knowledge: Good  Language: Good  Akathisia:  No  Handed:  Right  AIMS (if indicated):  0  Assets:  Communication Skills Desire for Improvement Housing Resilience  ADL's:  Intact  Cognition: WNL  Sleep:  fair    Assessment: Generalized anxiety disorder.  Major depressive disorder recurrent mild.  Plan: We have suggested counseling but patient is not interested in counseling.  He like to adjust his medication.  I will add low-dose Wellbutrin to help his anxiety.  Discuss medication side effects and benefits.  Continue Cymbalta 60 mg daily and Wellbutrin her milligram in the morning.  Recommended to call us back if he has any question, concern or if he feel worsening of the symptom.  Discuss Wellbutrin may cause insomnia and worsening of  anxiety in the beginning.  Follow-up in 3 months.  Venice Marcucci T., MD 09/18/2016, 4:08 PM

## 2016-09-24 MED FILL — AMLODIPINE-VALSARTAN 5-320: 5-320 | 90 days supply | Qty: 90 | Fill #0

## 2016-10-15 DIAGNOSIS — G894 Chronic pain syndrome: Secondary | ICD-10-CM | POA: Diagnosis not present

## 2016-10-31 MED FILL — LEVOTHYROXINE 50 MCG TABLET: 50 | 90 days supply | Qty: 90 | Fill #0

## 2016-11-15 MED FILL — FOLIC ACID 1 MG TABLET: 1 | 90 days supply | Qty: 90 | Fill #0

## 2016-11-15 MED FILL — DULoxetine HCL 60 MG CPEP: 60 | 90 days supply | Qty: 90 | Fill #0

## 2016-11-15 MED FILL — DOXAZOSIN MESYLATE 4 MG TAB: 4 | 90 days supply | Qty: 90 | Fill #0

## 2016-11-19 MED FILL — VENTOLIN HFA 90 MCG INHALER: 108 (90 BAS | 17 days supply | Qty: 18 | Fill #0

## 2016-11-20 DIAGNOSIS — H5213 Myopia, bilateral: Secondary | ICD-10-CM | POA: Diagnosis not present

## 2016-11-21 MED FILL — ANASTROZOLE 1 MG TABLET: 1 | 90 days supply | Qty: 90 | Fill #1

## 2016-11-23 DIAGNOSIS — N200 Calculus of kidney: Secondary | ICD-10-CM | POA: Diagnosis not present

## 2016-11-23 DIAGNOSIS — R3129 Other microscopic hematuria: Secondary | ICD-10-CM | POA: Diagnosis not present

## 2016-11-23 DIAGNOSIS — N202 Calculus of kidney with calculus of ureter: Secondary | ICD-10-CM | POA: Diagnosis not present

## 2016-12-03 ENCOUNTER — Ambulatory Visit (HOSPITAL_COMMUNITY): Payer: 59 | Admitting: Certified Registered"

## 2016-12-03 ENCOUNTER — Encounter (HOSPITAL_COMMUNITY)
Admission: RE | Disposition: A | Discharge status: Home / Self Care | Payer: Self-pay | Source: Ambulatory Visit | Attending: Gastroenterology

## 2016-12-03 ENCOUNTER — Ambulatory Visit (HOSPITAL_COMMUNITY)
Admission: RE | Admit: 2016-12-03 | Discharge: 2016-12-03 | Disposition: A | Discharge status: Home / Self Care | Payer: 59 | Source: Ambulatory Visit | Attending: Gastroenterology | Admitting: Gastroenterology

## 2016-12-03 ENCOUNTER — Encounter (HOSPITAL_COMMUNITY): Payer: Self-pay

## 2016-12-03 DIAGNOSIS — J45909 Unspecified asthma, uncomplicated: Secondary | ICD-10-CM | POA: Diagnosis not present

## 2016-12-03 DIAGNOSIS — E039 Hypothyroidism, unspecified: Secondary | ICD-10-CM | POA: Insufficient documentation

## 2016-12-03 DIAGNOSIS — I10 Essential (primary) hypertension: Secondary | ICD-10-CM | POA: Diagnosis not present

## 2016-12-03 DIAGNOSIS — E291 Testicular hypofunction: Secondary | ICD-10-CM | POA: Diagnosis not present

## 2016-12-03 DIAGNOSIS — F329 Major depressive disorder, single episode, unspecified: Secondary | ICD-10-CM | POA: Insufficient documentation

## 2016-12-03 DIAGNOSIS — Z87442 Personal history of urinary calculi: Secondary | ICD-10-CM | POA: Insufficient documentation

## 2016-12-03 DIAGNOSIS — M1990 Unspecified osteoarthritis, unspecified site: Secondary | ICD-10-CM | POA: Insufficient documentation

## 2016-12-03 DIAGNOSIS — Z1211 Encounter for screening for malignant neoplasm of colon: Secondary | ICD-10-CM | POA: Diagnosis not present

## 2016-12-03 DIAGNOSIS — N400 Enlarged prostate without lower urinary tract symptoms: Secondary | ICD-10-CM | POA: Diagnosis not present

## 2016-12-03 HISTORY — PX: COLONOSCOPY WITH PROPOFOL: SHX5780

## 2016-12-03 SURGERY — COLONOSCOPY WITH PROPOFOL
Anesthesia: Monitor Anesthesia Care

## 2016-12-03 MED ORDER — LACTATED RINGERS IV SOLN
INTRAVENOUS | Status: DC
  Administered 2016-12-03: 1000 mL via INTRAVENOUS

## 2016-12-03 MED ORDER — PROPOFOL 10 MG/ML IV BOLUS
INTRAVENOUS | Status: DC | PRN
  Administered 2016-12-03: 10 mg via INTRAVENOUS
  Administered 2016-12-03: 40 mg via INTRAVENOUS

## 2016-12-03 MED ORDER — LIDOCAINE 2% (20 MG/ML) 5 ML SYRINGE
INTRAMUSCULAR | Status: DC | PRN
  Administered 2016-12-03: 100 mg via INTRAVENOUS

## 2016-12-03 MED ORDER — LIDOCAINE 2% (20 MG/ML) 5 ML SYRINGE
INTRAMUSCULAR | Status: AC
  Filled 2016-12-03: qty 5

## 2016-12-03 MED ORDER — PROPOFOL 10 MG/ML IV BOLUS
INTRAVENOUS | Status: AC
  Filled 2016-12-03: qty 40

## 2016-12-03 MED ORDER — PROPOFOL 500 MG/50ML IV EMUL
INTRAVENOUS | Status: DC | PRN
  Administered 2016-12-03: 175 ug/kg/min via INTRAVENOUS

## 2016-12-03 MED ORDER — SODIUM CHLORIDE 0.9 % IV SOLN: INTRAVENOUS | Status: DC

## 2016-12-03 SURGICAL SUPPLY — 22 items

## 2016-12-03 NOTE — Anesthesia Preprocedure Evaluation (Signed)
Anesthesia Evaluation  Patient identified by MRN, date of birth, ID band Patient awake    Reviewed: Allergy & Precautions, NPO status , Patient's Chart, lab work & pertinent test results  Airway Mallampati: II  TM Distance: >3 FB Neck ROM: Full    Dental  (+) Teeth Intact, Dental Advisory Given   Pulmonary    breath sounds clear to auscultation       Cardiovascular hypertension,  Rhythm:Regular Rate:Normal     Neuro/Psych    GI/Hepatic   Endo/Other    Renal/GU      Musculoskeletal   Abdominal   Peds  Hematology   Anesthesia Other Findings   Reproductive/Obstetrics                             Anesthesia Physical Anesthesia Plan  ASA: II  Anesthesia Plan: MAC   Post-op Pain Management:    Induction: Intravenous  Airway Management Planned: Simple Face Mask and Natural Airway  Additional Equipment:   Intra-op Plan:   Post-operative Plan:   Informed Consent: I have reviewed the patients History and Physical, chart, labs and discussed the procedure including the risks, benefits and alternatives for the proposed anesthesia with the patient or authorized representative who has indicated his/her understanding and acceptance.   Dental advisory given  Plan Discussed with: CRNA and Anesthesiologist  Anesthesia Plan Comments:         Anesthesia Quick Evaluation

## 2016-12-03 NOTE — Transfer of Care (Signed)
Immediate Anesthesia Transfer of Care Note  Patient: Nicholas Mcintosh  Procedure(s) Performed: Procedure(s): COLONOSCOPY WITH PROPOFOL (N/A)  Patient Location: PACU  Anesthesia Type:MAC  Level of Consciousness: awake, alert  and oriented  Airway & Oxygen Therapy: Patient Spontanous Breathing and Patient connected to face mask oxygen  Post-op Assessment: Report given to RN and Post -op Vital signs reviewed and stable  Post vital signs: Reviewed and stable  Last Vitals:  Vitals:   12/03/16 0830  BP: (!) 178/62  Pulse: (!) 51  Resp: 12  Temp: 36.5 C    Last Pain:  Vitals:   12/03/16 0830  TempSrc: Oral         Complications: No apparent anesthesia complications

## 2016-12-03 NOTE — Anesthesia Postprocedure Evaluation (Addendum)
Anesthesia Post Note  Patient: [employer]  Procedure(s) Performed: Procedure(s) (LRB): COLONOSCOPY WITH PROPOFOL (N/A)  Patient location during evaluation: Endoscopy Anesthesia Type: MAC Level of consciousness: awake, awake and alert and oriented Pain management: pain level controlled Vital Signs Assessment: post-procedure vital signs reviewed and stable Respiratory status: spontaneous breathing, nonlabored ventilation and respiratory function stable Cardiovascular status: blood pressure returned to baseline Anesthetic complications: no       Last Vitals:  Vitals:   12/03/16 0940 12/03/16 0950  BP: (!) 152/123 (!) 160/84  Pulse: (!) 51 (!) 47  Resp: 15 17  Temp:      Last Pain:  Vitals:   12/03/16 0830  TempSrc: Oral                 Maurilio Puryear COKER

## 2016-12-03 NOTE — H&P (Signed)
Procedure: Baseline screening colonoscopy.  History: The patient is a 51 year old male born [birth year]. He is scheduled to undergo his first screening colonoscopy with polypectomy to prevent colon cancer.  Past medical history: Hypertension. Asthma. Hypothyroidism. Kidney stone. Osteoarthritis of the right knee. Depression. Kidney stone surgery. Scrotum surgery.  Medication allergies: Flomax  Exam: The patient is alert and lying comfortably on the endoscopy stretcher. Abdomen is soft and nontender to palpation. Cardiac exam reveals a regular rhythm. Lungs are clear to auscultation.  Plan: Proceed with screening colonoscopy

## 2016-12-03 NOTE — Op Note (Signed)
[facility] Patient Name: Nicholas Mcintosh Procedure Date: 12/03/2016 MRN: [id number] Attending MD: Charolett BumpersMartin K Nicholas Mcintosh , MD Date of Birth: 01/13/1966 CSN: [id number] Age: 51 Admit Type: Outpatient Procedure:                Colonoscopy Indications:              Screening for colorectal malignant neoplasm Providers:                Charolett BumpersMartin K. Nicholas Wilinski, MD, Janae SauceKaren D. Steele BergHinson, RN, Nicholas SorrelKatie                            Mcintosh, Technician Referring MD:              Medicines:                Propofol per Anesthesia Complications:            No immediate complications. Estimated Blood Loss:     Estimated blood loss: none. Procedure:                Pre-Anesthesia Assessment:                           - Prior to the procedure, a History and Physical                            was performed, and patient medications and                            allergies were reviewed. The patient's tolerance of                            previous anesthesia was also reviewed. The risks                            and benefits of the procedure and the sedation                            options and risks were discussed with the patient.                            All questions were answered, and informed consent                            was obtained. Prior Anticoagulants: The patient has                            taken no previous anticoagulant or antiplatelet                            agents. ASA Grade Assessment: II - A patient with                            mild systemic disease. After reviewing the risks  and benefits, the patient was deemed in                            satisfactory condition to undergo the procedure.                           After obtaining informed consent, the colonoscope                            was passed under direct vision. Throughout the                            procedure, the patient's blood pressure, pulse, and                             oxygen saturations were monitored continuously. The                            EC-[id number] ([id number]) scope was introduced through                            the anus and advanced to the the cecum, identified                            by appendiceal orifice and ileocecal valve. The                            colonoscopy was performed without difficulty. The                            patient tolerated the procedure well. The quality                            of the bowel preparation was good. The terminal                            ileum, the ileocecal valve, the appendiceal orifice                            and the rectum were photographed. Scope In: 9:02:46 AM Scope Out: 9:16:15 AM Scope Withdrawal Time: 0 hours 9 minutes 54 seconds  Total Procedure Duration: 0 hours 13 minutes 29 seconds  Findings:      The perianal and digital rectal examinations were normal.      The entire examined colon appeared normal. Impression:               - The entire examined colon is normal.                           - No specimens collected. Moderate Sedation:      N/A- Per Anesthesia Care Recommendation:           - Patient has a contact number available for  emergencies. The signs and symptoms of potential                            delayed complications were discussed with the                            patient. Return to normal activities tomorrow.                            Written discharge instructions were provided to the                            patient.                           - Repeat colonoscopy in 10 years for screening                            purposes.                           - Resume previous diet.                           - Continue present medications. Procedure Code(s):        --- Professional ---                           Z6109, Colorectal cancer screening; colonoscopy on                            individual not meeting criteria for high  risk Diagnosis Code(s):        --- Professional ---                           Z12.11, Encounter for screening for malignant                            neoplasm of colon CPT copyright 2016 [employer]. All rights reserved. The codes documented in this report are preliminary and upon coder review may  be revised to meet current compliance requirements. Danise Edge, MD Charolett Bumpers, MD 12/03/2016 9:21:34 AM This report has been signed electronically. Number of Addenda: 0

## 2016-12-03 NOTE — Discharge Instructions (Signed)

## 2016-12-06 ENCOUNTER — Encounter (HOSPITAL_COMMUNITY): Payer: Self-pay | Admitting: Gastroenterology

## 2016-12-18 ENCOUNTER — Ambulatory Visit (HOSPITAL_COMMUNITY): Payer: Self-pay | Admitting: Psychiatry

## 2016-12-18 DIAGNOSIS — N202 Calculus of kidney with calculus of ureter: Secondary | ICD-10-CM | POA: Diagnosis not present

## 2016-12-18 DIAGNOSIS — E291 Testicular hypofunction: Secondary | ICD-10-CM | POA: Diagnosis not present

## 2016-12-18 DIAGNOSIS — R310 Gross hematuria: Secondary | ICD-10-CM | POA: Diagnosis not present

## 2016-12-18 DIAGNOSIS — N401 Enlarged prostate with lower urinary tract symptoms: Secondary | ICD-10-CM | POA: Diagnosis not present

## 2016-12-18 DIAGNOSIS — R351 Nocturia: Secondary | ICD-10-CM | POA: Diagnosis not present

## 2017-01-09 DIAGNOSIS — I10 Essential (primary) hypertension: Secondary | ICD-10-CM | POA: Diagnosis not present

## 2017-01-09 DIAGNOSIS — E291 Testicular hypofunction: Secondary | ICD-10-CM | POA: Diagnosis not present

## 2017-01-09 DIAGNOSIS — E039 Hypothyroidism, unspecified: Secondary | ICD-10-CM | POA: Diagnosis not present

## 2017-01-09 DIAGNOSIS — G894 Chronic pain syndrome: Secondary | ICD-10-CM | POA: Diagnosis not present

## 2017-01-09 DIAGNOSIS — M549 Dorsalgia, unspecified: Secondary | ICD-10-CM | POA: Diagnosis not present

## 2017-01-09 DIAGNOSIS — J309 Allergic rhinitis, unspecified: Secondary | ICD-10-CM | POA: Diagnosis not present

## 2017-01-09 DIAGNOSIS — F324 Major depressive disorder, single episode, in partial remission: Secondary | ICD-10-CM | POA: Diagnosis not present

## 2017-01-09 MED FILL — METHYLPREDNISOLONE 4 MG TAB: 4 | 6 days supply | Qty: 21 | Fill #0

## 2017-01-14 ENCOUNTER — Encounter (HOSPITAL_COMMUNITY): Payer: Self-pay | Admitting: Emergency Medicine

## 2017-01-14 ENCOUNTER — Ambulatory Visit (HOSPITAL_COMMUNITY)
Admission: EM | Admit: 2017-01-14 | Discharge: 2017-01-14 | Disposition: A | Discharge status: Home / Self Care | Payer: 59 | Attending: Family Medicine | Admitting: Family Medicine

## 2017-01-14 DIAGNOSIS — M778 Other enthesopathies, not elsewhere classified: Secondary | ICD-10-CM

## 2017-01-14 DIAGNOSIS — IMO0002 Reserved for concepts with insufficient information to code with codable children: Secondary | ICD-10-CM

## 2017-01-14 MED ORDER — MELOXICAM 15 MG PO TABS: 15.0000 mg | ORAL_TABLET | Freq: Every day | ORAL | 2 refills | Status: DC

## 2017-01-14 MED FILL — AMLODIPINE-VALSARTAN 5-320: 5-320 | 90 days supply | Qty: 90 | Fill #1

## 2017-01-14 NOTE — Discharge Instructions (Signed)
Your symptoms are consistent with tendinitis, I recommend rest, avoid lifting weights, follow-up with your orthopedist as needed. For pain, I have prescribed meloxicam, take one tablet daily as needed. If symptoms persist, follow-up with your orthopedist.

## 2017-01-14 NOTE — ED Triage Notes (Signed)
Pt c/o right arm pain onset 3 weeks associated w/swelling  Denies inj/trauma but recalls teaching son on how to lift weights properly  A&O x4... NAD... Ambulatory

## 2017-01-14 NOTE — ED Provider Notes (Signed)
CSN: [id number]     Arrival date & time 01/14/17  1815 History   None    Chief Complaint  Patient presents with  . Arm Pain   (Consider location/radiation/quality/duration/timing/severity/associated sxs/prior Treatment) Nicholas Mcintosh is a 51 y.o. male who [facility] with a chief complaint of right arm pain, swelling, and tenderness ongoing for 1 month. Patient lifts weights, worse with flexion, and with rotation. No fever, chills, vomiting, or other associated symptoms.   The history is provided by the patient.    Past Medical History:  Diagnosis Date  . Chronic back pain    hx fall 2006 from 3 stories , vertebral fx  . Complication of anesthesia   . ED (erectile dysfunction)   . Exercise-induced asthma   . History of concussion    2006 from fall  . History of kidney stones   . Hypertension   . Hypothyroidism   . Nephrolithiasis    bilateral  . PONV (postoperative nausea and vomiting)   . Right ureteral stone    Past Surgical History:  Procedure Laterality Date  . CARDIAC CATHETERIZATION  07-12-2011   dr Sharyn Lullharwani   abnormal myoview/  normal coronary arteries and normal LVSF  . CARDIOVASCULAR STRESS TEST  06-15-2011   dr Sharyn Lullharwani   mild reversible periinfarct anterior wall ischemia, normal wall motion , ef 49%  . COLONOSCOPY WITH PROPOFOL N/A 12/03/2016   Procedure: COLONOSCOPY WITH PROPOFOL;  Surgeon: Charolett BumpersJohnson, Martin K, MD;  Location: [facility];  Service: Endoscopy;  Laterality: N/A;  . CYSTOSCOPY WITH HOLMIUM LASER LITHOTRIPSY Left 02/15/2015   Procedure: CYSTOSCOPY WITH HOLMIUM LASER LITHOTRIPSY;  Surgeon: Jethro BolusSigmund Tannenbaum, MD;  Location: [facility];  Service: Urology;  Laterality: Left;  . CYSTOSCOPY WITH STENT PLACEMENT Left 02/15/2015   Procedure: CYSTOSCOPY WITH STENT PLACEMENT LEFT URETER;  Surgeon: Jethro BolusSigmund Tannenbaum, MD;  Location: [facility];  Service: Urology;  Laterality: Left;  . CYSTOSCOPY/RETROGRADE/URETEROSCOPY/STONE EXTRACTION WITH  BASKET Left 02/15/2015   Procedure: CYSTOSCOPY/RETROGRADE/URETEROSCOPY/STONE EXTRACTION WITH BASKET;  Surgeon: Jethro BolusSigmund Tannenbaum, MD;  Location: [facility];  Service: Urology;  Laterality: Left;  Marland Kitchen. VARICOCELECTOMY  2001 ?   Family History  Problem Relation Age of Onset  . Hypertension Mother   . Cancer Maternal Aunt        Breast   Social History  Substance Use Topics  . Smoking status: Never Smoker  . Smokeless tobacco: Never Used  . Alcohol use No    Review of Systems  Constitutional: Negative.   HENT: Negative.   Respiratory: Negative.   Cardiovascular: Negative.   Gastrointestinal: Negative.   Musculoskeletal: Negative for back pain, joint swelling, neck pain and neck stiffness.       Right forearm pain  Skin: Negative.   Neurological: Negative.     Allergies  Molds & smuts and Dust mite extract  Home Medications   Prior to Admission medications   Medication Sig Start Date End Date Taking? Authorizing Provider  albuterol (PROVENTIL HFA;VENTOLIN HFA) 108 (90 BASE) MCG/ACT inhaler Inhale 2 puffs into the lungs every 4 (four) hours as needed for wheezing or shortness of breath. 05/01/14  Yes Cathren LaineSteinl, Kevin, MD  amLODipine-valsartan (EXFORGE) 5-320 MG per tablet Take 1 tablet by mouth daily. 02/10/15  Yes Henrietta HooverBernhardt, Linda C, NP  anastrozole (ARIMIDEX) 1 MG tablet Take 1 mg by mouth daily. 08/09/16  Yes [provider]  doxazosin (CARDURA) 4 MG tablet Take 1 tablet (4 mg total) by mouth daily. 02/10/15  Yes Bernhardt,  Jaclyn Shaggy, NP  folic acid (FOLVITE) 1 MG tablet TAKE 1 TABLET BY MOUTH DAILY. 08/12/15  Yes Henrietta Hoover, NP  levothyroxine (SYNTHROID, LEVOTHROID) 50 MCG tablet TAKE 1 TABLET BY MOUTH ONCE DAILY 05/13/15  Yes Henrietta Hoover, NP  oxyCODONE-acetaminophen (PERCOCET/ROXICET) 5-325 MG tablet Take 1 tablet by mouth 3 (three) times daily.  08/03/15  Yes [provider]  atorvastatin (LIPITOR) 40 MG tablet Take 1 tablet (40 mg total) by mouth daily.  01/03/15   Altha Harm, MD  buPROPion [facility]) 100 MG tablet Take 1 tablet (100 mg total) by mouth every morning. 09/18/16 09/18/17  Arfeen, Phillips Grout, MD  calcium carbonate (TUMS - DOSED IN MG ELEMENTAL CALCIUM) 500 MG chewable tablet Chew 2 tablets by mouth daily as needed for indigestion or heartburn.    [provider]  DULoxetine (CYMBALTA) 60 MG capsule Take 1 capsule (60 mg total) by mouth daily. 09/18/16   Arfeen, Phillips Grout, MD  meloxicam (MOBIC) 15 MG tablet Take 1 tablet (15 mg total) by mouth daily. 01/14/17   Dorena Bodo, NP  sildenafil (VIAGRA) 100 MG tablet Take 1 tablet (100 mg total) by mouth daily as needed for erectile dysfunction. 03/10/14   Altha Harm, MD   Meds Ordered and Administered this Visit  Medications - No data to display  BP 132/73 (BP Location: Left Arm)   Pulse 61   Temp 98.6 F (37 C) (Oral)   Resp 20   SpO2 98%  No data found.   Physical Exam  Constitutional: He is oriented to person, place, and time. He appears well-developed and well-nourished. No distress.  HENT:  Head: Normocephalic and atraumatic.  Right Ear: External ear normal.  Left Ear: External ear normal.  Eyes: Conjunctivae are normal.  Neck: Normal range of motion.  Cardiovascular: Normal rate and regular rhythm.   Pulmonary/Chest: Effort normal and breath sounds normal.  Musculoskeletal:       Right forearm: He exhibits tenderness. He exhibits no swelling, no edema and no deformity.       Arms: Pain right forearm with rotation, and flexion but not extension.  Neurological: He is alert and oriented to person, place, and time.  Skin: Skin is warm and dry. Capillary refill takes less than 2 seconds. He is not diaphoretic.  Psychiatric: He has a normal mood and affect. His behavior is normal.  Nursing note and vitals reviewed.   Urgent Care Course     Procedures (including critical care time)  Labs Review Labs Reviewed - No data to display  Imaging  Review No results found.    MDM   1. Tendonitis of elbow or forearm     Either tendinitis, or strained muscle within the flexor muscles and tendons of the right forearm. Eckman rest, ice, elevation when possible, started on meloxicam daily, follow-up with orthopedics.     Dorena Bodo, NP 01/14/17 [id number]

## 2017-01-16 DIAGNOSIS — M5408 Panniculitis affecting regions of neck and back, sacral and sacrococcygeal region: Secondary | ICD-10-CM | POA: Diagnosis not present

## 2017-01-16 DIAGNOSIS — M9905 Segmental and somatic dysfunction of pelvic region: Secondary | ICD-10-CM | POA: Diagnosis not present

## 2017-01-16 DIAGNOSIS — M9903 Segmental and somatic dysfunction of lumbar region: Secondary | ICD-10-CM | POA: Diagnosis not present

## 2017-01-16 DIAGNOSIS — M62838 Other muscle spasm: Secondary | ICD-10-CM | POA: Diagnosis not present

## 2017-01-16 DIAGNOSIS — M5136 Other intervertebral disc degeneration, lumbar region: Secondary | ICD-10-CM | POA: Diagnosis not present

## 2017-01-17 MED FILL — MELOXICAM 15 MG TABLET: 15 | 30 days supply | Qty: 30 | Fill #0

## 2017-01-28 IMAGING — CR DG ABDOMEN 2V
2 series · 2 of 2 positions shown · non-contrast
Comparison: Abdominal pelvic CT scan August 16, 2014

CLINICAL DATA: Increasing right-sided abdominal pain for the past
week without nausea or vomiting, history of urinary tract stones

EXAM:
ABDOMEN - 2 VIEW

[w abdomen upright]
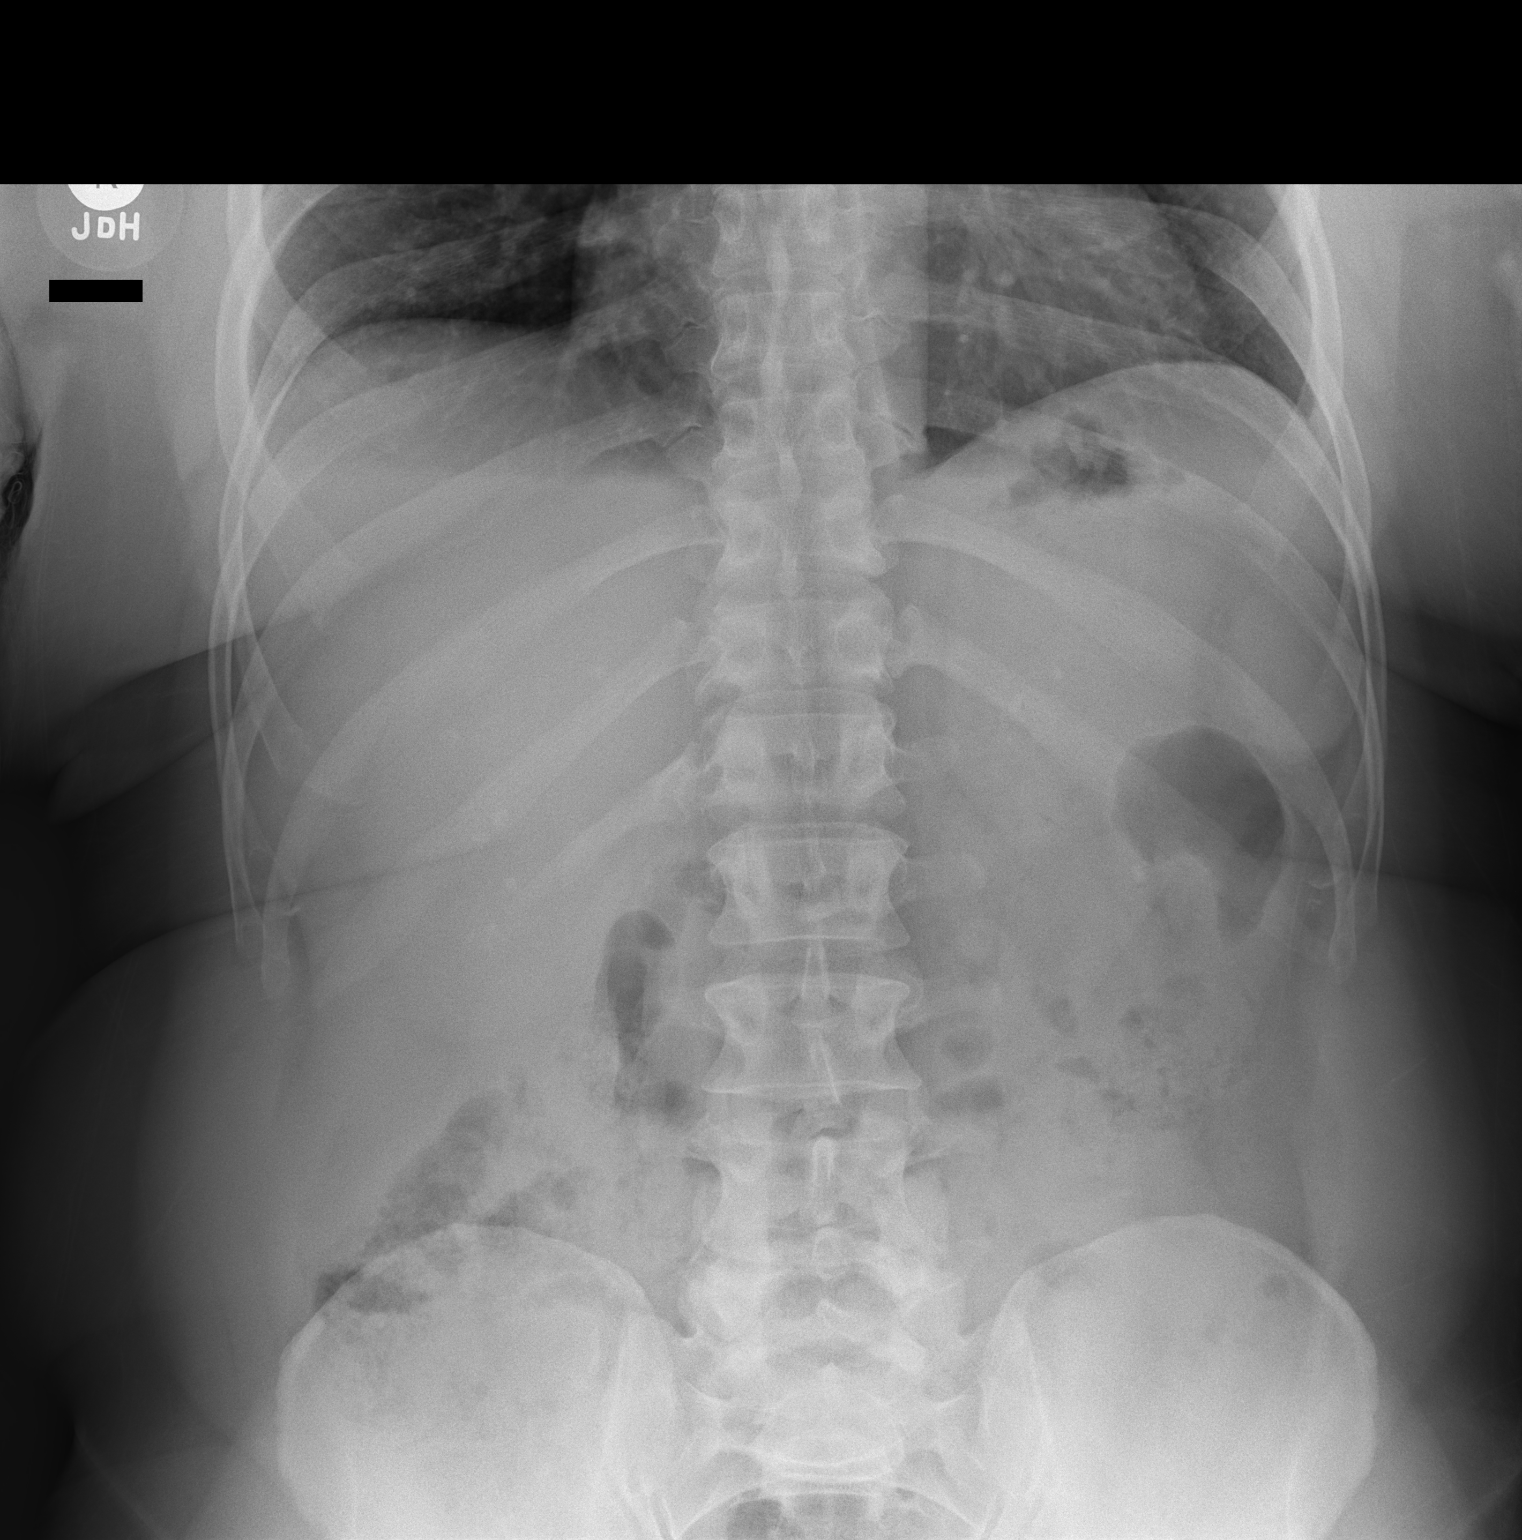

[t abdomen supine]
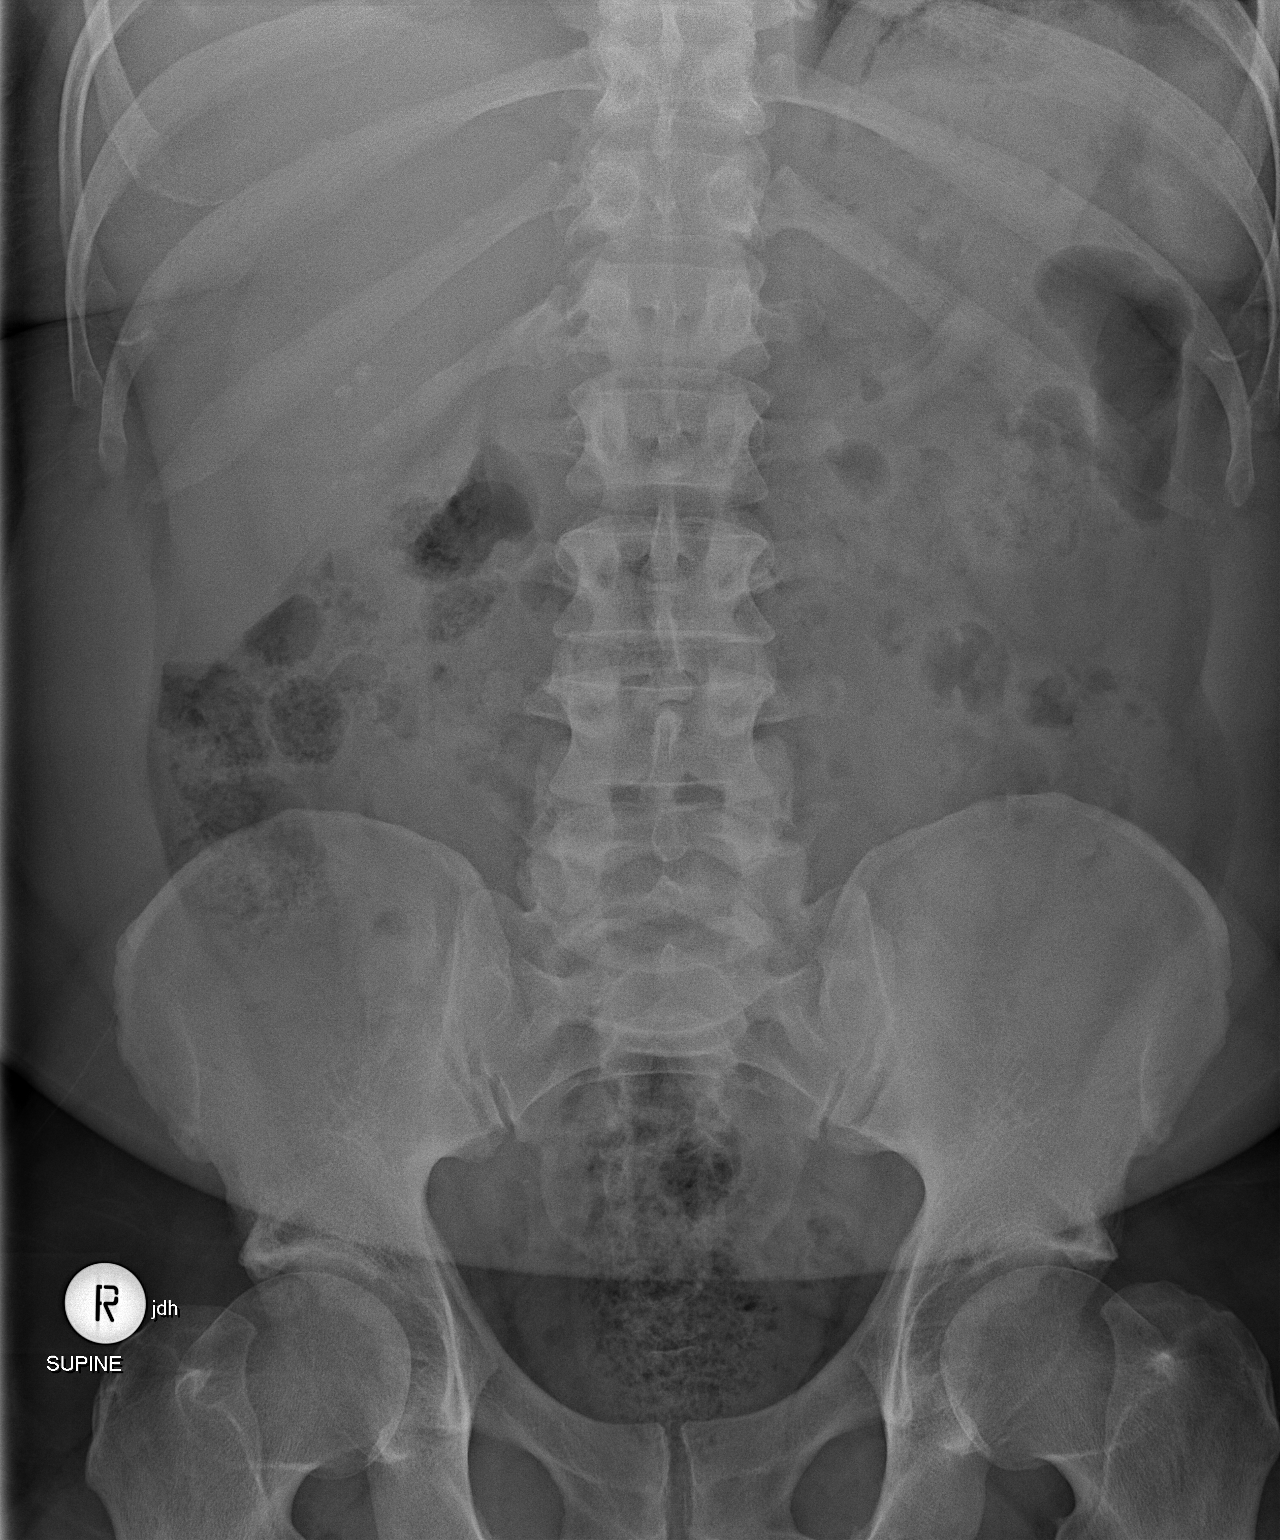

[2 of 2 positions shown; findings below may reference images not displayed]

FINDINGS: The bowel gas pattern is unremarkable. There are multiple calcified
stones projecting over both kidneys. There is a approximately 2 mm
diameter stone projecting at the level of the right ureteropelvic
junction. No bladder stones are observed. The bony structures are
unremarkable.
IMPRESSION: 1. Multiple calcified kidney stones are present bilaterally. A stone
projects at approximately the L1-L2 disc level to the right of
midline and may reflect a stone at the right ureteropelvic junction.
Abdominal CT scanning or ultrasound are recommended.
2. The bowel gas pattern is unremarkable.

## 2017-02-04 MED FILL — ANASTROZOLE 1 MG TABLET: 1 | 90 days supply | Qty: 180 | Fill #0

## 2017-02-11 ENCOUNTER — Other Ambulatory Visit (HOSPITAL_COMMUNITY): Payer: Self-pay | Admitting: Psychiatry

## 2017-02-11 DIAGNOSIS — F411 Generalized anxiety disorder: Secondary | ICD-10-CM

## 2017-02-12 MED FILL — LEVOTHYROXINE 50 MCG TABLET: 50 | 90 days supply | Qty: 90 | Fill #1

## 2017-02-15 MED FILL — VENTOLIN HFA 90 MCG INHALER: 108 (90 BAS | 15 days supply | Qty: 18 | Fill #1

## 2017-02-22 IMAGING — CT CT RENAL STONE PROTOCOL
2 of 3 series · 17 of 39 positions shown, 19 images · non-contrast
Comparison: 02/10/2015

CLINICAL DATA: Right-sided pain today with nausea

EXAM:
CT ABDOMEN AND PELVIS WITHOUT CONTRAST
TECHNIQUE: Multidetector CT imaging of the abdomen and pelvis was performed
following the standard protocol without IV contrast.

[Series 4: lung · axial · 0.84mm/px · z∈[-139,-29]mm · 14 of 25 slices shown, 16 images]
[im 2/25  soft-tissue]
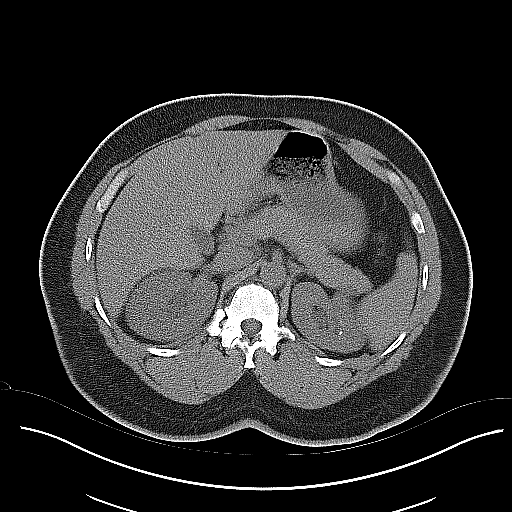
[im 2/25  bone]
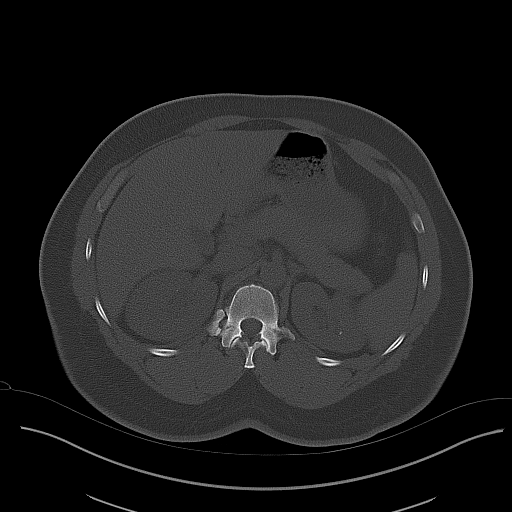
[im 4/25  soft-tissue]
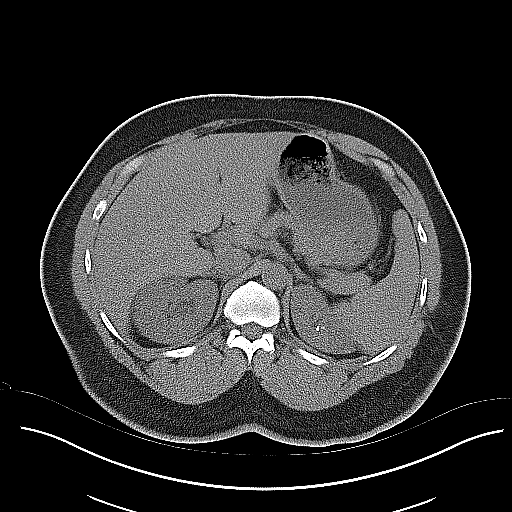
[im 6/25  soft-tissue]
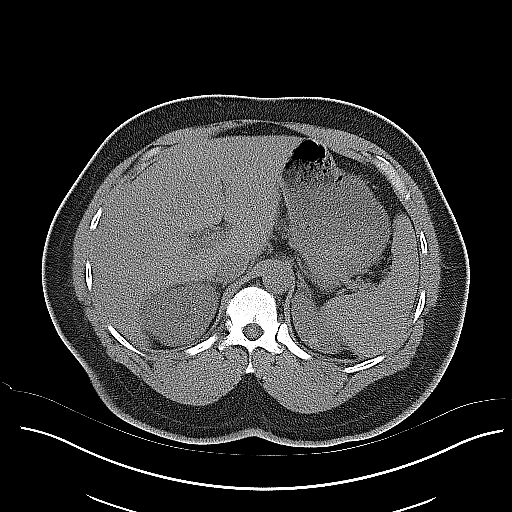
[im 7/25  soft-tissue]
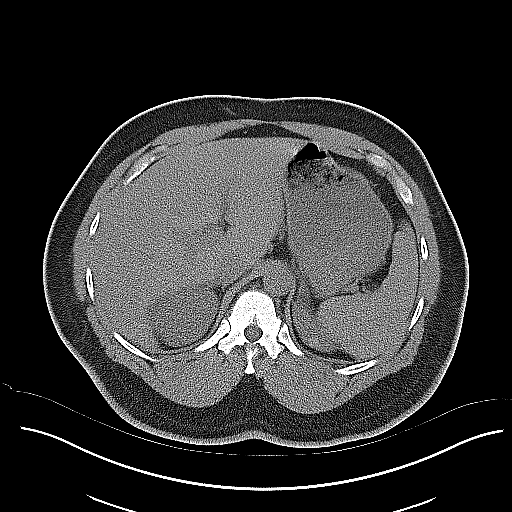
[im 9/25  soft-tissue]
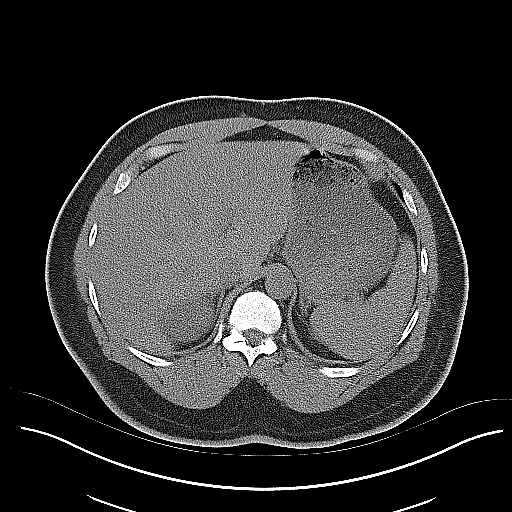
[im 11/25  soft-tissue]
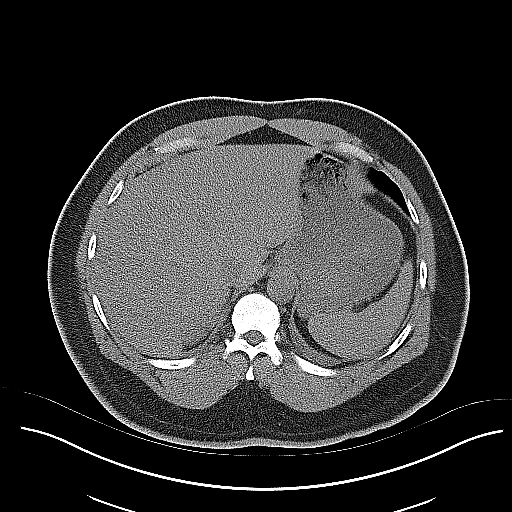
[im 12/25  soft-tissue]
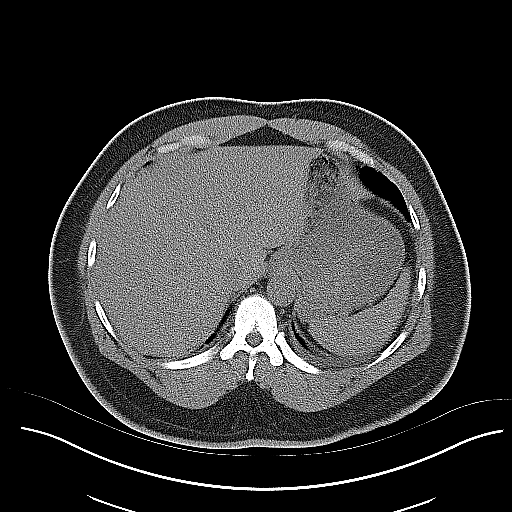
[im 14/25  soft-tissue]
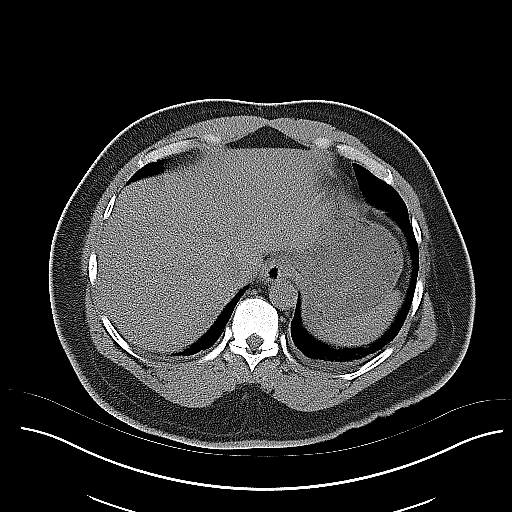
[im 15/25  soft-tissue]
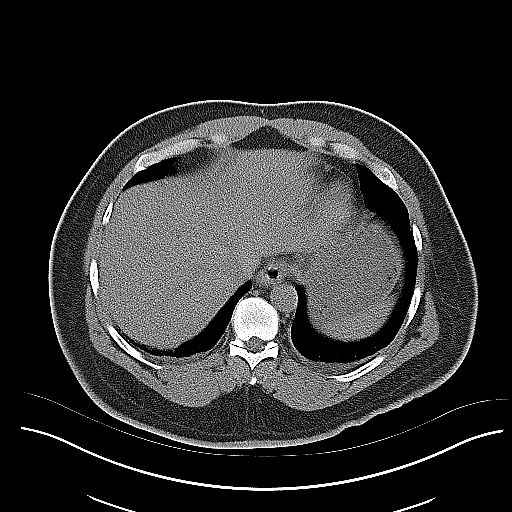
[im 15/25  bone]
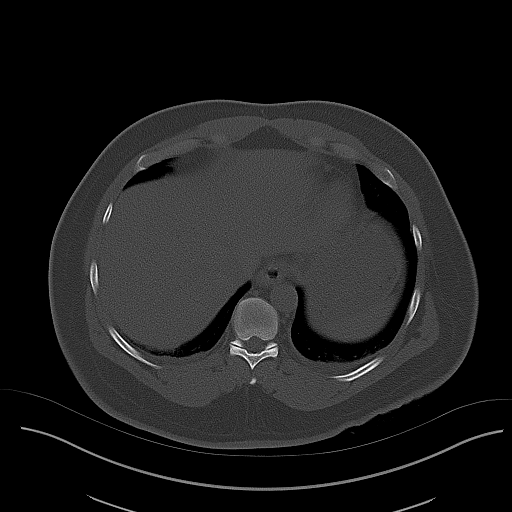
[im 17/25  soft-tissue]
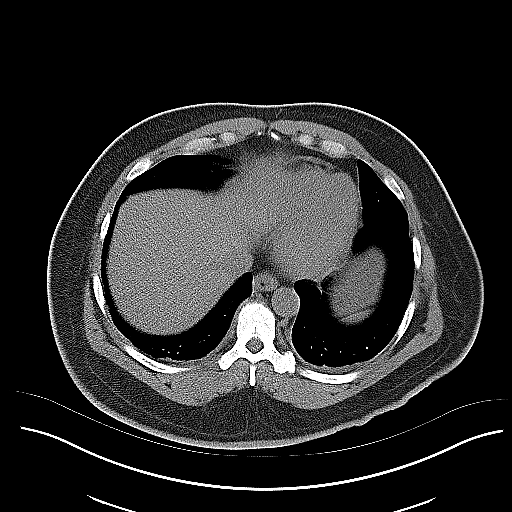
[im 19/25  soft-tissue]
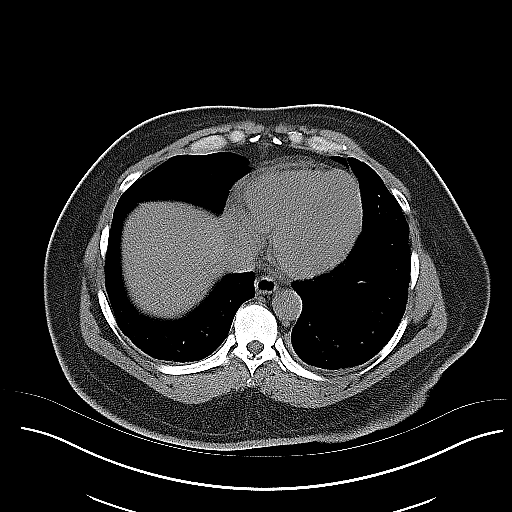
[im 20/25  soft-tissue]
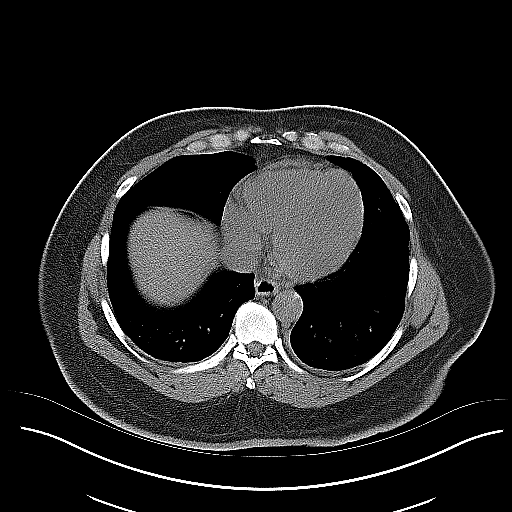
[im 22/25  soft-tissue]
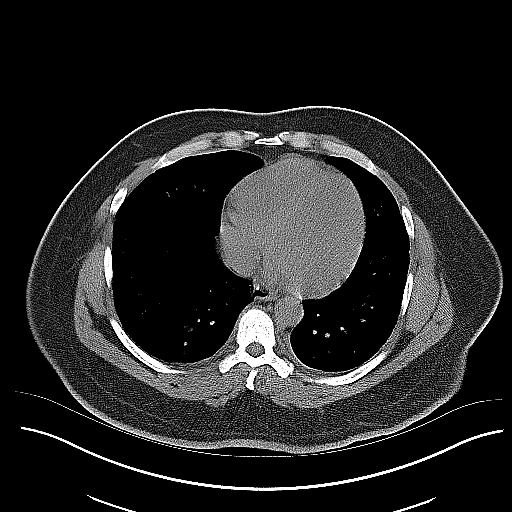
[im 24/25  soft-tissue]
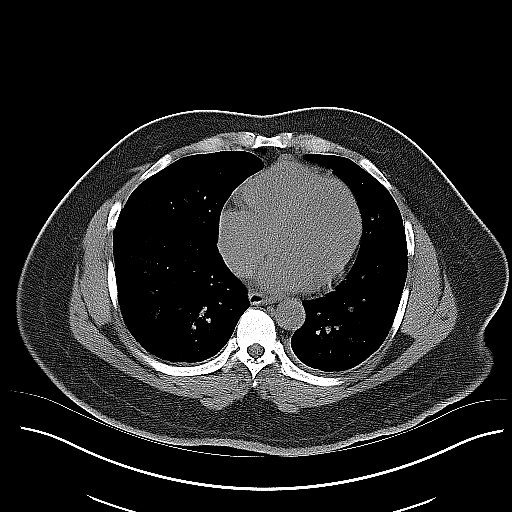

[Series 5: coronal · coronal · 0.80mm/px · 3 of 97 slices shown]
[im 33/97  soft-tissue]
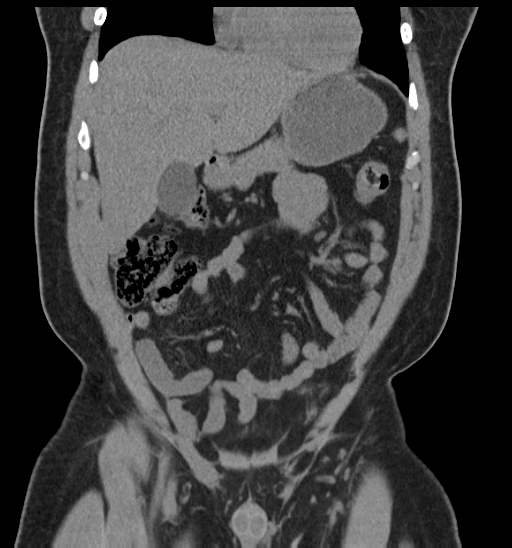
[im 43/97  soft-tissue]
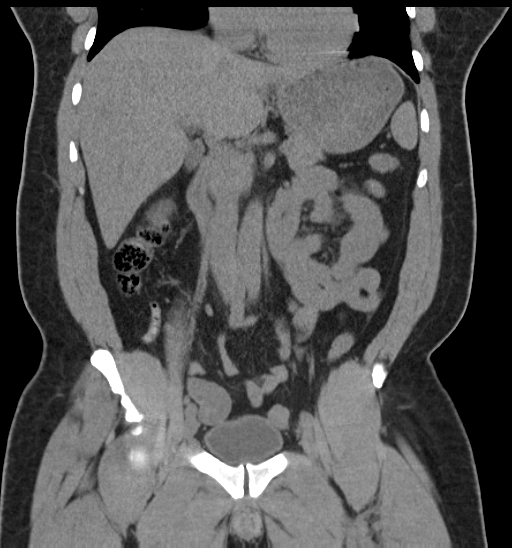
[im 54/97  soft-tissue]
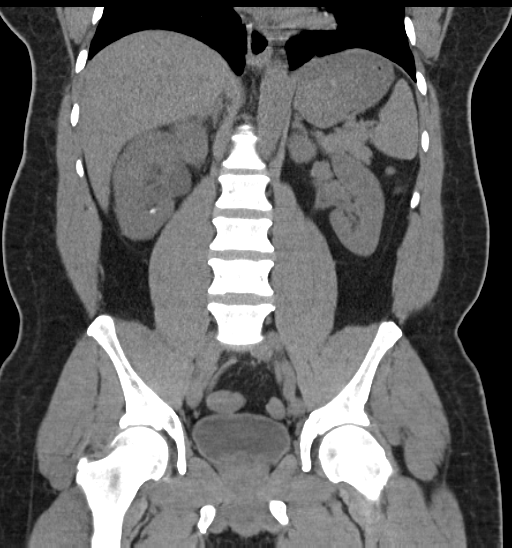

[17 of 39 positions shown; findings below may reference images not displayed]

FINDINGS: There is a 4 mm right ureteral calculus at the level of the L3-4
interspace with moderate hydronephrosis and proximal hydroureter.
This is new compared to the scan of 02/10/2015. Numerous collecting
system calculi are present in both renal collecting systems,
measuring up to 6 mm in size. The left ureteral calculi observed on
02/10/2015 have passed.

No other acute findings are evident in the abdomen or pelvis.

There are unremarkable unenhanced appearances of the liver, spleen,
pancreas, adrenals, gallbladder, bile ducts, appendix. The abdominal
aorta is normal in caliber. There is no atherosclerotic
calcification. There is no adenopathy in the abdomen or pelvis.

There is no significant abnormality in the lower chest. There is no
significant musculoskeletal lesion.
IMPRESSION: 1. Obstructing 4 mm right ureteral calculus at the L3-4 level.
2. Bilateral nephrolithiasis

## 2017-02-26 DIAGNOSIS — M9905 Segmental and somatic dysfunction of pelvic region: Secondary | ICD-10-CM | POA: Diagnosis not present

## 2017-02-26 DIAGNOSIS — M9903 Segmental and somatic dysfunction of lumbar region: Secondary | ICD-10-CM | POA: Diagnosis not present

## 2017-02-26 DIAGNOSIS — M5408 Panniculitis affecting regions of neck and back, sacral and sacrococcygeal region: Secondary | ICD-10-CM | POA: Diagnosis not present

## 2017-02-26 DIAGNOSIS — M5136 Other intervertebral disc degeneration, lumbar region: Secondary | ICD-10-CM | POA: Diagnosis not present

## 2017-02-26 DIAGNOSIS — M62838 Other muscle spasm: Secondary | ICD-10-CM | POA: Diagnosis not present

## 2017-03-04 ENCOUNTER — Ambulatory Visit (HOSPITAL_COMMUNITY): Payer: 59 | Admitting: Psychiatry

## 2017-03-12 MED FILL — VENTOLIN HFA 90 MCG INHALER: 108 (90 BAS | 15 days supply | Qty: 18 | Fill #2

## 2017-03-14 MED FILL — DOXAZOSIN MESYLATE 4 MG TAB: 4 | 90 days supply | Qty: 90 | Fill #1

## 2017-03-14 MED FILL — FOLIC ACID 1 MG TABLET: 1 | 90 days supply | Qty: 90 | Fill #1

## 2017-03-21 NOTE — Addendum Note (Signed)
Addendum  created 03/21/17 1406 by Bashar Milam, MD   Sign clinical note    

## 2017-03-26 ENCOUNTER — Ambulatory Visit (HOSPITAL_COMMUNITY): Payer: Self-pay | Admitting: Psychiatry

## 2017-04-08 ENCOUNTER — Other Ambulatory Visit (HOSPITAL_COMMUNITY): Payer: Self-pay

## 2017-04-08 DIAGNOSIS — F411 Generalized anxiety disorder: Secondary | ICD-10-CM

## 2017-04-08 MED ORDER — DULOXETINE HCL 60 MG PO CPEP: 60.0000 mg | ORAL_CAPSULE | Freq: Every day | ORAL | 0 refills | Status: DC

## 2017-04-09 ENCOUNTER — Ambulatory Visit (HOSPITAL_COMMUNITY): Payer: Self-pay | Admitting: Psychiatry

## 2017-04-30 ENCOUNTER — Ambulatory Visit (HOSPITAL_COMMUNITY): Payer: Self-pay | Admitting: Psychiatry

## 2017-05-03 ENCOUNTER — Ambulatory Visit (INDEPENDENT_AMBULATORY_CARE_PROVIDER_SITE_OTHER): Payer: 59 | Admitting: Psychiatry

## 2017-05-03 ENCOUNTER — Encounter (HOSPITAL_COMMUNITY): Payer: Self-pay | Admitting: Psychiatry

## 2017-05-03 DIAGNOSIS — F332 Major depressive disorder, recurrent severe without psychotic features: Secondary | ICD-10-CM | POA: Diagnosis not present

## 2017-05-03 DIAGNOSIS — Z6379 Other stressful life events affecting family and household: Secondary | ICD-10-CM | POA: Diagnosis not present

## 2017-05-03 DIAGNOSIS — F411 Generalized anxiety disorder: Secondary | ICD-10-CM

## 2017-05-03 MED ORDER — DULOXETINE HCL 60 MG PO CPEP: 60.0000 mg | ORAL_CAPSULE | Freq: Every day | ORAL | 1 refills | Status: DC

## 2017-05-03 NOTE — Progress Notes (Signed)
BH MD/PA/NP OP Progress Note  05/03/2017 8:24 AM Nicholas Mcintosh  MRN:  [id number]  Chief Complaint:  I apologize missing appointment.  I have been very busy.  My wife is very sick and my mother fell and broke her hip.  HPI: Patient came for his follow-up appointment.  He missed appointment and he was seen last in February.  Patient told the past few months he has been very busy taking care of his family members.  His wife has been very sick and she is seeing audible doctors and he is taking her to the appointments.  His mother also fell and broke her hip and currently doing physical therapy and he is taking her to the appointments.  He endorse financial problems with taking his Cymbalta.  He never took Wellbutrin because he does not want to take more medication and he is taking melatonin over-the-counter for sleep.  Patient reported that his sleep is improved and he is not as tired during the day.  He didn't like Cymbalta and he likes to continue that.  Despite stressful situation at home he is doing better.  He denies any irritability, anger, mania, psychosis or any crying spells.  He denies any suicidal thoughts or homicidal thought.  He continues to worry about his rectal injury and now he is seeing a new neurologist in November.  Patient works part-time with [employer].  He denies drinking alcohol or using any illegal substances.  He has no tremors shakes or any EPS.  His energy level is good.  His vital signs are stable.  Visit Diagnosis:    ICD-10-CM   1. Generalized anxiety disorder F41.1 DULoxetine (CYMBALTA) 60 MG capsule    Past Psychiatric History: Reviewed. Patient denies any history of suicidal attempt or any psychiatric inpatient treatment.  He has been taking Cymbalta since he is seeing this Clinical research associate.  Recommended to add Wellbutrin but he never filled the prescription.    Past Medical History:  Past Medical History:  Diagnosis Date  . Chronic back pain    hx fall 2006 from 3 stories ,  vertebral fx  . Complication of anesthesia   . ED (erectile dysfunction)   . Exercise-induced asthma   . History of concussion    2006 from fall  . History of kidney stones   . Hypertension   . Hypothyroidism   . Nephrolithiasis    bilateral  . PONV (postoperative nausea and vomiting)   . Right ureteral stone     Past Surgical History:  Procedure Laterality Date  . CARDIAC CATHETERIZATION  07-12-2011   dr Sharyn Lull   abnormal myoview/  normal coronary arteries and normal LVSF  . CARDIOVASCULAR STRESS TEST  06-15-2011   dr Sharyn Lull   mild reversible periinfarct anterior wall ischemia, normal wall motion , ef 49%  . COLONOSCOPY WITH PROPOFOL N/A 12/03/2016   Procedure: COLONOSCOPY WITH PROPOFOL;  Surgeon: Charolett Bumpers, MD;  Location: [facility];  Service: Endoscopy;  Laterality: N/A;  . CYSTOSCOPY WITH HOLMIUM LASER LITHOTRIPSY Left 02/15/2015   Procedure: CYSTOSCOPY WITH HOLMIUM LASER LITHOTRIPSY;  Surgeon: Jethro Bolus, MD;  Location: [facility];  Service: Urology;  Laterality: Left;  . CYSTOSCOPY WITH STENT PLACEMENT Left 02/15/2015   Procedure: CYSTOSCOPY WITH STENT PLACEMENT LEFT URETER;  Surgeon: Jethro Bolus, MD;  Location: [facility];  Service: Urology;  Laterality: Left;  . CYSTOSCOPY/RETROGRADE/URETEROSCOPY/STONE EXTRACTION WITH BASKET Left 02/15/2015   Procedure: CYSTOSCOPY/RETROGRADE/URETEROSCOPY/STONE EXTRACTION WITH BASKET;  Surgeon: Jethro Bolus, MD;  [facility];  Service:  Urology;  Laterality: Left;  Marland Kitchen VARICOCELECTOMY  2001 ?    Family Psychiatric History: Reviewed.  Family History:  Family History  Problem Relation Age of Onset  . Hypertension Mother   . Cancer Maternal Aunt        Breast    Social History:  Social History   Social History  . Marital status: Married    Spouse name: N/A  . Number of children: N/A  . Years of education: N/A   Social History Main Topics  . Smoking status: Never Smoker  . Smokeless tobacco: Never Used  .  Alcohol use No  . Drug use: No  . Sexual activity: No   Other Topics Concern  . Not on file   Social History Narrative  . No narrative on file    Allergies:  Allergies  Allergen Reactions  . Molds & Smuts     Asthma attacks around molds  . Dust Mite Extract Other (See Comments)    Ashtma    Metabolic Disorder Labs: Lab Results  Component Value Date   HGBA1C 5.6 04/15/2014   MPG 114 04/15/2014   MPG 105 01/11/2014   Lab Results  Component Value Date   PROLACTIN 4.7 04/28/2014   Lab Results  Component Value Date   CHOL 126 02/10/2015   TRIG 152 (H) 02/10/2015   HDL 36 (L) 02/10/2015   CHOLHDL 3.5 02/10/2015   VLDL 30 02/10/2015   LDLCALC 60 02/10/2015   LDLCALC 59 04/15/2014   Lab Results  Component Value Date   TSH 1.123 02/10/2015   TSH 1.359 01/05/2013    Therapeutic Level Labs: No results found for: LITHIUM No results found for: VALPROATE No components found for:  CBMZ  Current Medications: Current Outpatient Prescriptions  Medication Sig Dispense Refill  . albuterol (PROVENTIL HFA;VENTOLIN HFA) 108 (90 BASE) MCG/ACT inhaler Inhale 2 puffs into the lungs every 4 (four) hours as needed for wheezing or shortness of breath. 1 Inhaler 3  . amLODipine-valsartan (EXFORGE) 5-320 MG per tablet Take 1 tablet by mouth daily. 30 tablet 2  . anastrozole (ARIMIDEX) 1 MG tablet Take 1 mg by mouth daily.  3  . atorvastatin (LIPITOR) 40 MG tablet Take 1 tablet (40 mg total) by mouth daily. 90 tablet 3  . buPROPion (WELLBUTRIN) 100 MG tablet Take 1 tablet (100 mg total) by mouth every morning. 90 tablet 0  . calcium carbonate (TUMS - DOSED IN MG ELEMENTAL CALCIUM) 500 MG chewable tablet Chew 2 tablets by mouth daily as needed for indigestion or heartburn.    . doxazosin (CARDURA) 4 MG tablet Take 1 tablet (4 mg total) by mouth daily. 30 tablet 3  . DULoxetine (CYMBALTA) 60 MG capsule Take 1 capsule (60 mg total) by mouth daily. 30 capsule 0  . folic acid (FOLVITE) 1 MG  tablet TAKE 1 TABLET BY MOUTH DAILY. 30 tablet 3  . levothyroxine (SYNTHROID, LEVOTHROID) 50 MCG tablet TAKE 1 TABLET BY MOUTH ONCE DAILY 90 tablet 0  . meloxicam (MOBIC) 15 MG tablet Take 1 tablet (15 mg total) by mouth daily. 30 tablet 2  . oxyCODONE-acetaminophen (PERCOCET/ROXICET) 5-325 MG tablet Take 1 tablet by mouth 3 (three) times daily.   0  . sildenafil (VIAGRA) 100 MG tablet Take 1 tablet (100 mg total) by mouth daily as needed for erectile dysfunction. 10 tablet 0   No current facility-administered medications for this visit.      Musculoskeletal: Strength & Muscle Tone: within normal limits Gait & Station:  normal Patient leans: N/A  Psychiatric Specialty Exam: ROS  Blood pressure 132/76, pulse (!) 59, height  (1.676 m), weight 234 lb 6.4 oz (106.3 kg).Body mass index is 37.83 kg/m.  General Appearance: Casual  Eye Contact:  Good  Speech:  Clear and Coherent  Volume:  Normal  Mood:  Anxious  Affect:  Appropriate  Thought Process:  Goal Directed  Orientation:  Full (Time, Place, and Person)  Thought Content: Rumination   Suicidal Thoughts:  No  Homicidal Thoughts:  No  Memory:  Immediate;   Good Recent;   Good Remote;   Good  Judgement:  Good  Insight:  Fair  Psychomotor Activity:  Normal  Concentration:  Concentration: Fair and Attention Span: Fair  Recall:  Fair  Fund of Knowledge: Good  Language: Good  Akathisia:  No  Handed:  Right  AIMS (if indicated): not done  Assets:  Communication Skills Desire for Improvement Housing Resilience  ADL's:  Intact  Cognition: WNL  Sleep:  improved   Screenings: PHQ2-9     [facility]2015 [facility] Office Visit from 01/24/2015 in [facility] Office Visit from 01/03/2015 in [facility] Office Visit from 12/03/2014 in [facility]  PHQ-2 Total Score  0  0  0   0  0  PHQ-9 Total Score  0  -  -  -  -       Assessment and Plan: Major depressive disorder, recurrent.  Anxiety disorder NOS.  Discusses psychosocial stressors.  Patient is not interested in counseling but like to continue Cymbalta 60 mg daily.  I would discontinue Wellbutrin since he never filled the prescription.  He'll continue melatonin over-the-counter for insomnia.  Recommended to call us back if he has any question or any concern.  Continue Cymbalta 60 mg daily.  Patient like to follow-up in 6 months.  Discuss safety concern that anytime having active suicidal thoughts or homicidal thoughts and he need to call 911 or go to the local emergency room.   Dulcey Riederer T., MD 05/03/2017, 8:24 AM

## 2017-05-06 MED FILL — AMLODIPINE-VALSARTAN 5-320: 5-320 | 90 days supply | Qty: 90 | Fill #2

## 2017-05-13 MED FILL — ANASTROZOLE 1 MG TABLET: 1 | 90 days supply | Qty: 180 | Fill #1

## 2017-05-28 MED FILL — LEVOTHYROXINE 50 MCG TABLET: 50 | 90 days supply | Qty: 90 | Fill #2

## 2017-06-03 MED FILL — DULoxetine HCL 60 MG CPEP: 60 | 90 days supply | Qty: 90 | Fill #0

## 2017-06-18 DIAGNOSIS — E291 Testicular hypofunction: Secondary | ICD-10-CM | POA: Diagnosis not present

## 2017-07-10 DIAGNOSIS — I10 Essential (primary) hypertension: Secondary | ICD-10-CM | POA: Diagnosis not present

## 2017-07-10 DIAGNOSIS — Z125 Encounter for screening for malignant neoplasm of prostate: Secondary | ICD-10-CM | POA: Diagnosis not present

## 2017-07-10 DIAGNOSIS — Z0000 Encounter for general adult medical examination without abnormal findings: Secondary | ICD-10-CM | POA: Diagnosis not present

## 2017-07-10 DIAGNOSIS — F324 Major depressive disorder, single episode, in partial remission: Secondary | ICD-10-CM | POA: Diagnosis not present

## 2017-07-10 DIAGNOSIS — Z1389 Encounter for screening for other disorder: Secondary | ICD-10-CM | POA: Diagnosis not present

## 2017-07-10 DIAGNOSIS — R7309 Other abnormal glucose: Secondary | ICD-10-CM | POA: Diagnosis not present

## 2017-07-10 DIAGNOSIS — N200 Calculus of kidney: Secondary | ICD-10-CM | POA: Diagnosis not present

## 2017-07-10 DIAGNOSIS — Z136 Encounter for screening for cardiovascular disorders: Secondary | ICD-10-CM | POA: Diagnosis not present

## 2017-07-10 DIAGNOSIS — N529 Male erectile dysfunction, unspecified: Secondary | ICD-10-CM | POA: Diagnosis not present

## 2017-07-10 DIAGNOSIS — E039 Hypothyroidism, unspecified: Secondary | ICD-10-CM | POA: Diagnosis not present

## 2017-07-10 DIAGNOSIS — E291 Testicular hypofunction: Secondary | ICD-10-CM | POA: Diagnosis not present

## 2017-07-10 DIAGNOSIS — Z23 Encounter for immunization: Secondary | ICD-10-CM | POA: Diagnosis not present

## 2017-07-10 DIAGNOSIS — G894 Chronic pain syndrome: Secondary | ICD-10-CM | POA: Diagnosis not present

## 2017-07-10 MED FILL — SILDENAFIL CITRATE 100 MG T: 100 | 30 days supply | Qty: 6 | Fill #0

## 2017-07-12 MED FILL — DOXAZOSIN MESYLATE 4 MG TAB: 4 | 90 days supply | Qty: 90 | Fill #2

## 2017-07-12 MED FILL — FOLIC ACID 1 MG TABLET: 1 | 90 days supply | Qty: 90 | Fill #2

## 2017-07-16 DIAGNOSIS — Z87442 Personal history of urinary calculi: Secondary | ICD-10-CM | POA: Diagnosis not present

## 2017-07-16 DIAGNOSIS — N5201 Erectile dysfunction due to arterial insufficiency: Secondary | ICD-10-CM | POA: Diagnosis not present

## 2017-07-16 DIAGNOSIS — E291 Testicular hypofunction: Secondary | ICD-10-CM | POA: Diagnosis not present

## 2017-07-16 MED FILL — CLOMIPHENE CITRATE 50 MG TA: 50 | 30 days supply | Qty: 30 | Fill #0

## 2017-08-12 MED FILL — CLOMIPHENE CITRATE 50 MG TA: 50 | 30 days supply | Qty: 30 | Fill #1

## 2017-08-27 DIAGNOSIS — Z125 Encounter for screening for malignant neoplasm of prostate: Secondary | ICD-10-CM | POA: Diagnosis not present

## 2017-08-27 DIAGNOSIS — E291 Testicular hypofunction: Secondary | ICD-10-CM | POA: Diagnosis not present

## 2017-08-27 MED FILL — VENTOLIN HFA 90 MCG INHALER: 108 (90 BAS | 17 days supply | Qty: 18 | Fill #3

## 2017-08-28 MED FILL — ANASTROZOLE 1 MG TABLET: 1 | 90 days supply | Qty: 180 | Fill #2

## 2017-08-28 MED FILL — AMLODIPINE-VALSARTAN 5-320: 5-320 | 90 days supply | Qty: 90 | Fill #0

## 2017-08-28 MED FILL — LEVOTHYROXINE 50 MCG TABLET: 50 | 90 days supply | Qty: 90 | Fill #3

## 2017-09-12 MED FILL — DULoxetine HCL 60 MG CPEP: 60 | 90 days supply | Qty: 90 | Fill #1

## 2017-09-17 MED FILL — CLOMIPHENE CITRATE 50 MG TA: 50 | 30 days supply | Qty: 30 | Fill #2

## 2017-10-25 MED FILL — CLOMIPHENE CITRATE 50 MG TA: 50 | 12 days supply | Qty: 12 | Fill #3

## 2017-11-04 ENCOUNTER — Ambulatory Visit (HOSPITAL_COMMUNITY): Payer: 59 | Admitting: Psychiatry

## 2017-11-04 ENCOUNTER — Ambulatory Visit (HOSPITAL_COMMUNITY): Payer: Self-pay | Admitting: Psychiatry

## 2017-11-04 DIAGNOSIS — G894 Chronic pain syndrome: Secondary | ICD-10-CM | POA: Diagnosis not present

## 2017-11-04 DIAGNOSIS — E039 Hypothyroidism, unspecified: Secondary | ICD-10-CM | POA: Diagnosis not present

## 2017-11-04 DIAGNOSIS — I10 Essential (primary) hypertension: Secondary | ICD-10-CM | POA: Diagnosis not present

## 2017-11-04 DIAGNOSIS — J309 Allergic rhinitis, unspecified: Secondary | ICD-10-CM | POA: Diagnosis not present

## 2017-11-04 DIAGNOSIS — E291 Testicular hypofunction: Secondary | ICD-10-CM | POA: Diagnosis not present

## 2017-11-04 DIAGNOSIS — F324 Major depressive disorder, single episode, in partial remission: Secondary | ICD-10-CM | POA: Diagnosis not present

## 2017-11-04 DIAGNOSIS — E669 Obesity, unspecified: Secondary | ICD-10-CM | POA: Diagnosis not present

## 2017-11-05 ENCOUNTER — Ambulatory Visit (HOSPITAL_COMMUNITY): Payer: 59 | Admitting: Psychiatry

## 2017-11-12 MED FILL — FOLIC ACID 1 MG TABS: 1 | 90 days supply | Qty: 90 | Fill #3

## 2017-11-12 MED FILL — DOXAZOSIN MESYLATE 4 MG TAB: 4 | 90 days supply | Qty: 90 | Fill #3

## 2017-11-14 MED FILL — VENTOLIN HFA 90 MCG INHALER: 108 (90 BAS | 17 days supply | Qty: 18 | Fill #4

## 2017-11-25 MED FILL — CLOMIPHENE CITRATE 50 MG TA: 50 | 30 days supply | Qty: 30 | Fill #4

## 2017-11-27 DIAGNOSIS — H5213 Myopia, bilateral: Secondary | ICD-10-CM | POA: Diagnosis not present

## 2017-11-27 MED FILL — LEVOTHYROXINE 50 MCG TABLET: 50 | 90 days supply | Qty: 90 | Fill #0

## 2017-11-29 ENCOUNTER — Encounter (HOSPITAL_COMMUNITY): Payer: Self-pay

## 2017-11-29 ENCOUNTER — Other Ambulatory Visit: Payer: Self-pay

## 2017-11-29 ENCOUNTER — Ambulatory Visit (HOSPITAL_COMMUNITY)
Admission: EM | Admit: 2017-11-29 | Discharge: 2017-11-29 | Disposition: A | Discharge status: Home / Self Care | Payer: 59 | Attending: Urgent Care | Admitting: Urgent Care

## 2017-11-29 DIAGNOSIS — J029 Acute pharyngitis, unspecified: Secondary | ICD-10-CM | POA: Diagnosis not present

## 2017-11-29 DIAGNOSIS — I10 Essential (primary) hypertension: Secondary | ICD-10-CM

## 2017-11-29 DIAGNOSIS — R059 Cough, unspecified: Secondary | ICD-10-CM

## 2017-11-29 DIAGNOSIS — H938X3 Other specified disorders of ear, bilateral: Secondary | ICD-10-CM

## 2017-11-29 DIAGNOSIS — R05 Cough: Secondary | ICD-10-CM

## 2017-11-29 MED ORDER — AMOXICILLIN 875 MG PO TABS: 875.0000 mg | ORAL_TABLET | Freq: Two times a day (BID) | ORAL | 0 refills | Status: DC

## 2017-11-29 NOTE — ED Provider Notes (Signed)
MRN: [id number] DOB: 05-02-1966  Subjective:   Nicholas Mcintosh is a 52 y.o. male presenting for 2-week history of persistent sore throat, sinus congestion, bilateral ear fullness and popping.  He has also had an intermittent cough.  Denies fever, sinus pain, ear drainage, chest pain, shortness of breath, wheezing, nausea, vomiting, belly pain.  Of note, his son did test positive and received treatment for strep approximately 2 weeks ago as well.  Denies smoking cigarettes.  No current facility-administered medications for this encounter.   Current Outpatient Medications:  .  amLODipine-valsartan (EXFORGE) 5-320 MG per tablet, Take 1 tablet by mouth daily., Disp: 30 tablet, Rfl: 2 .  anastrozole (ARIMIDEX) 1 MG tablet, Take 1 mg by mouth daily., Disp: , Rfl: 3 .  doxazosin (CARDURA) 4 MG tablet, Take 1 tablet (4 mg total) by mouth daily., Disp: 30 tablet, Rfl: 3 .  DULoxetine (CYMBALTA) 60 MG capsule, Take 1 capsule (60 mg total) by mouth daily., Disp: 90 capsule, Rfl: 1 .  folic acid (FOLVITE) 1 MG tablet, TAKE 1 TABLET BY MOUTH DAILY., Disp: 30 tablet, Rfl: 3 .  levothyroxine (SYNTHROID, LEVOTHROID) 50 MCG tablet, TAKE 1 TABLET BY MOUTH ONCE DAILY, Disp: 90 tablet, Rfl: 0 .  oxyCODONE-acetaminophen (PERCOCET/ROXICET) 5-325 MG tablet, Take 1 tablet by mouth 3 (three) times daily. , Disp: , Rfl: 0 .  albuterol (PROVENTIL HFA;VENTOLIN HFA) 108 (90 BASE) MCG/ACT inhaler, Inhale 2 puffs into the lungs every 4 (four) hours as needed for wheezing or shortness of breath., Disp: 1 Inhaler, Rfl: 3 .  atorvastatin (LIPITOR) 40 MG tablet, Take 1 tablet (40 mg total) by mouth daily., Disp: 90 tablet, Rfl: 3 .  calcium carbonate (TUMS - DOSED IN MG ELEMENTAL CALCIUM) 500 MG chewable tablet, Chew 2 tablets by mouth daily as needed for indigestion or heartburn., Disp: , Rfl:  .  meloxicam (MOBIC) 15 MG tablet, Take 1 tablet (15 mg total) by mouth daily., Disp: 30 tablet, Rfl: 2 .  sildenafil (VIAGRA) 100 MG  tablet, Take 1 tablet (100 mg total) by mouth daily as needed for erectile dysfunction., Disp: 10 tablet, Rfl: 0   Allergies  Allergen Reactions  . Molds & Smuts     Asthma attacks around molds  . Dust Mite Extract Other (See Comments)    Ashtma    Past Medical History:  Diagnosis Date  . Chronic back pain    hx fall 2006 from 3 stories , vertebral fx  . Complication of anesthesia   . ED (erectile dysfunction)   . Exercise-induced asthma   . History of concussion    2006 from fall  . History of kidney stones   . Hypertension   . Hypothyroidism   . Nephrolithiasis    bilateral  . PONV (postoperative nausea and vomiting)   . Right ureteral stone      Past Surgical History:  Procedure Laterality Date  . CARDIAC CATHETERIZATION  07-12-2011   dr Sharyn Lull   abnormal myoview/  normal coronary arteries and normal LVSF  . CARDIOVASCULAR STRESS TEST  06-15-2011   dr Sharyn Lull   mild reversible periinfarct anterior wall ischemia, normal wall motion , ef 49%  . COLONOSCOPY WITH PROPOFOL N/A 12/03/2016   Procedure: COLONOSCOPY WITH PROPOFOL;  Surgeon: Charolett Bumpers, MD;  [facility];  Service: Endoscopy;  Laterality: N/A;  . CYSTOSCOPY WITH HOLMIUM LASER LITHOTRIPSY Left 02/15/2015   Procedure: CYSTOSCOPY WITH HOLMIUM LASER LITHOTRIPSY;  Surgeon: Jethro Bolus, MD;  [facility];  Service: Urology;  Laterality: Left;  . CYSTOSCOPY WITH STENT PLACEMENT Left 02/15/2015   Procedure: CYSTOSCOPY WITH STENT PLACEMENT LEFT URETER;  Surgeon: Jethro Bolus, MD;  [facility];  Service: Urology;  Laterality: Left;  . CYSTOSCOPY/RETROGRADE/URETEROSCOPY/STONE EXTRACTION WITH BASKET Left 02/15/2015   Procedure: CYSTOSCOPY/RETROGRADE/URETEROSCOPY/STONE EXTRACTION WITH BASKET;  Surgeon: Jethro Bolus, MD;  [facility];  Service: Urology;  Laterality: Left;  Marland Kitchen VARICOCELECTOMY  2001 ?    Objective:   Vitals: BP (!) 149/82 (BP Location: Left Arm)   Pulse 63    Temp 98.4 F (36.9 C) (Oral)   Resp 16   SpO2 97%   Physical Exam  Constitutional: He is oriented to person, place, and time. He appears well-developed and well-nourished.  HENT:  Right Ear: Tympanic membrane normal.  Left Ear: Tympanic membrane normal.  Nose: No mucosal edema, rhinorrhea or sinus tenderness.  Mouth/Throat: Posterior oropharyngeal erythema (including uvula) present. No oropharyngeal exudate.  Cardiovascular: Normal rate, regular rhythm and intact distal pulses. Exam reveals no gallop and no friction rub.  No murmur heard. Pulmonary/Chest: No respiratory distress. He has no wheezes. He has no rales.  Neurological: He is alert and oriented to person, place, and time.    Assessment and Plan :   Pharyngitis, unspecified etiology  Ear fullness, bilateral  Cough  Essential hypertension  We will have patient start amoxicillin for management of pharyngitis.  Patient is to use supportive care otherwise. Return-to-clinic precautions discussed, patient verbalized understanding.    Wallis Bamberg, PA-C 11/29/17 1949

## 2017-11-29 NOTE — ED Triage Notes (Signed)
Patent presents to [facility] for sore throat and bilateral ear pain x2 weeks, pt has been exposed to strep throat .

## 2017-11-29 NOTE — Discharge Instructions (Signed)
Hydrate well with at least 2 liters (1 gallon) of water daily. For sore throat try using a honey-based tea. Use 3 teaspoons of honey with juice squeezed from half lemon. Place shaved pieces of ginger into 1/2-1 cup of water and warm over stove top. Then mix the ingredients and repeat every 4 hours as needed.  °

## 2017-12-16 MED FILL — AMLODIPINE-VALSARTAN 5-320: 5-320 | 90 days supply | Qty: 90 | Fill #1

## 2017-12-16 MED FILL — ANASTROZOLE 1 MG TABLET: 1 | 90 days supply | Qty: 180 | Fill #3

## 2017-12-26 ENCOUNTER — Other Ambulatory Visit (HOSPITAL_COMMUNITY): Payer: Self-pay | Admitting: Psychiatry

## 2017-12-26 DIAGNOSIS — F411 Generalized anxiety disorder: Secondary | ICD-10-CM

## 2018-01-02 ENCOUNTER — Ambulatory Visit (INDEPENDENT_AMBULATORY_CARE_PROVIDER_SITE_OTHER): Payer: 59 | Admitting: Psychiatry

## 2018-01-02 ENCOUNTER — Encounter (HOSPITAL_COMMUNITY): Payer: Self-pay | Admitting: Psychiatry

## 2018-01-02 DIAGNOSIS — N5082 Scrotal pain: Secondary | ICD-10-CM

## 2018-01-02 DIAGNOSIS — F411 Generalized anxiety disorder: Secondary | ICD-10-CM

## 2018-01-02 DIAGNOSIS — M549 Dorsalgia, unspecified: Secondary | ICD-10-CM | POA: Diagnosis not present

## 2018-01-02 DIAGNOSIS — M2550 Pain in unspecified joint: Secondary | ICD-10-CM

## 2018-01-02 MED ORDER — DULOXETINE HCL 60 MG PO CPEP: 60.0000 mg | ORAL_CAPSULE | Freq: Every day | ORAL | 1 refills | Status: DC

## 2018-01-02 MED FILL — DULoxetine HCL 60 MG CPEP: 60 | 90 days supply | Qty: 90 | Fill #0

## 2018-01-02 MED FILL — CLOMIPHENE CITRATE 50 MG TA: 50 | 30 days supply | Qty: 30 | Fill #5

## 2018-01-02 NOTE — Progress Notes (Signed)
BH MD/PA/NP OP Progress Note  01/02/2018 10:13 AM Nicholas Mcintosh  MRN:  [id number]  Chief Complaint: I am frustrated because no one cares about my scrotal injury.  I still cannot perform.  HPI: Nicholas Mcintosh came for his follow-up appointment.  He missed his appointment.  He was last seen in October 2018.  He apologized missing appointment.  He is been out of Cymbalta for past 2 weeks.  He continues to struggle with chronic symptoms of anxiety and irritability.  He is frustrated because his long-term scrotal injury did not resolve and he has difficulty performing sex.  He admitted Cymbalta helps the anxiety and since he has been out for past 2 weeks is getting frustrated and irritable.  Is also not sleeping well.  He takes over-the-counter melatonin.  He does not want to change medication because he believes Cymbalta helps the anxiety.  He has extensive work-up for his a scrotal energy including seeing urology.  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  He denies drinking or using any illegal substances.  Sometime he feels hopeless about his a scrotal injury but denies any suicidal thoughts or homicidal thought.  Denies any paranoia or any hallucination.  His energy level is good.  His appetite is okay.  He has no tremors shakes or any EPS.  He is not interested in counseling but like to continue Cymbalta.  He endorsed Cymbalta also helps his chronic back pain.  He  Visit Diagnosis:    ICD-10-CM   1. Generalized anxiety disorder F41.1 DULoxetine (CYMBALTA) 60 MG capsule    Past Psychiatric History: Reviewed. Patient denies any history of suicidal attempt or any psychiatric inpatient treatment.  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  Recommended to add Wellbutrin but he never filled the prescription.    Past Medical History:  Past Medical History:  Diagnosis Date  . Chronic back pain    hx fall 2006 from 3 stories , vertebral fx  . Complication of anesthesia   . ED  (erectile dysfunction)   . Exercise-induced asthma   . History of concussion    2006 from fall  . History of kidney stones   . Hypertension   . Hypothyroidism   . Nephrolithiasis    bilateral  . PONV (postoperative nausea and vomiting)   . Right ureteral stone     Past Surgical History:  Procedure Laterality Date  . CARDIAC CATHETERIZATION  07-12-2011   dr Sharyn Lullharwani   abnormal myoview/  normal coronary arteries and normal LVSF  . CARDIOVASCULAR STRESS TEST  06-15-2011   dr Sharyn Lullharwani   mild reversible periinfarct anterior wall ischemia, normal wall motion , ef 49%  . COLONOSCOPY WITH PROPOFOL N/A 12/03/2016   Procedure: COLONOSCOPY WITH PROPOFOL;  Surgeon: Charolett BumpersJohnson, Martin K, MD;  [facility];  Service: Endoscopy;  Laterality: N/A;  . CYSTOSCOPY WITH HOLMIUM LASER LITHOTRIPSY Left 02/15/2015   Procedure: CYSTOSCOPY WITH HOLMIUM LASER LITHOTRIPSY;  Surgeon: Jethro BolusSigmund Tannenbaum, MD;  [facility];  Service: Urology;  Laterality: Left;  . CYSTOSCOPY WITH STENT PLACEMENT Left 02/15/2015   Procedure: CYSTOSCOPY WITH STENT PLACEMENT LEFT URETER;  Surgeon: Jethro BolusSigmund Tannenbaum, MD;  [facility];  Service: Urology;  Laterality: Left;  . CYSTOSCOPY/RETROGRADE/URETEROSCOPY/STONE EXTRACTION WITH BASKET Left 02/15/2015   Procedure: CYSTOSCOPY/RETROGRADE/URETEROSCOPY/STONE EXTRACTION WITH BASKET;  Surgeon: Jethro BolusSigmund Tannenbaum, MD;  [facility];  Service: Urology;  Laterality: Left;  Marland Kitchen. VARICOCELECTOMY  2001 ?    Family Psychiatric History: Reviewed.  Family History:  Family History  Problem Relation Age of Onset  . Hypertension Mother   . Cancer Maternal Aunt        Breast    Social History:  Social History   Socioeconomic History  . Marital status: Married    Spouse name: Not on file  . Number of children: Not on file  . Years of education: Not on file  . Highest education level: Not on file  Occupational History  . Not on file  Social Needs  . Financial resource  strain: Not on file  . Food insecurity:    Worry: Not on file    Inability: Not on file  . Transportation needs:    Medical: Not on file    Non-medical: Not on file  Tobacco Use  . Smoking status: Never Smoker  . Smokeless tobacco: Never Used  Substance and Sexual Activity  . Alcohol use: No    Alcohol/week: 0.0 oz  . Drug use: No  . Sexual activity: Never  Lifestyle  . Physical activity:    Days per week: Not on file    Minutes per session: Not on file  . Stress: Not on file  Relationships  . Social connections:    Talks on phone: Not on file    Gets together: Not on file    Attends religious service: Not on file    Active member of club or organization: Not on file    Attends meetings of clubs or organizations: Not on file    Relationship status: Not on file  Other Topics Concern  . Not on file  Social History Narrative  . Not on file    Allergies:  Allergies  Allergen Reactions  . Molds & Smuts     Asthma attacks around molds  . Dust Mite Extract Other (See Comments)    Ashtma    Metabolic Disorder Labs: Lab Results  Component Value Date   HGBA1C 5.6 04/15/2014   MPG 114 04/15/2014   MPG 105 01/11/2014   Lab Results  Component Value Date   PROLACTIN 4.7 04/28/2014   Lab Results  Component Value Date   CHOL 126 02/10/2015   TRIG 152 (H) 02/10/2015   HDL 36 (L) 02/10/2015   CHOLHDL 3.5 02/10/2015   VLDL 30 02/10/2015   LDLCALC 60 02/10/2015   LDLCALC 59 04/15/2014   Lab Results  Component Value Date   TSH 1.123 02/10/2015   TSH 1.359 01/05/2013    Therapeutic Level Labs: No results found for: LITHIUM No results found for: VALPROATE No components found for:  CBMZ  Current Medications: Current Outpatient Medications  Medication Sig Dispense Refill  . albuterol (PROVENTIL HFA;VENTOLIN HFA) 108 (90 BASE) MCG/ACT inhaler Inhale 2 puffs into the lungs every 4 (four) hours as needed for wheezing or shortness of breath. 1 Inhaler 3  .  amLODipine-valsartan (EXFORGE) 5-320 MG per tablet Take 1 tablet by mouth daily. 30 tablet 2  . amoxicillin (AMOXIL) 875 MG tablet Take 1 tablet (875 mg total) by mouth 2 (two) times daily. 20 tablet 0  . anastrozole (ARIMIDEX) 1 MG tablet Take 1 mg by mouth daily.  3  . doxazosin (CARDURA) 4 MG tablet Take 1 tablet (4 mg total) by mouth daily. 30 tablet 3  . DULoxetine (CYMBALTA) 60 MG capsule Take 1 capsule (60 mg total) by mouth daily. 90 capsule 1  . folic acid (FOLVITE) 1 MG tablet TAKE 1 TABLET BY MOUTH DAILY. 30 tablet 3  . levothyroxine (SYNTHROID,  LEVOTHROID) 50 MCG tablet TAKE 1 TABLET BY MOUTH ONCE DAILY 90 tablet 0  . oxyCODONE-acetaminophen (PERCOCET/ROXICET) 5-325 MG tablet Take 1 tablet by mouth 3 (three) times daily.   0  . sildenafil (VIAGRA) 100 MG tablet Take 1 tablet (100 mg total) by mouth daily as needed for erectile dysfunction. 10 tablet 0   No current facility-administered medications for this visit.      Musculoskeletal: Strength & Muscle Tone: within normal limits Gait & Station: normal Patient leans: N/A  Psychiatric Specialty Exam: Review of Systems  Musculoskeletal: Positive for back pain and joint pain.  Psychiatric/Behavioral: The patient is nervous/anxious.     Blood pressure (!) 160/92, pulse 71, height 5\' 6"  (1.676 m), weight 238 lb (108 kg).Body mass index is 38.41 kg/m.  General Appearance: Casual  Eye Contact:  Fair  Speech:  Clear and Coherent  Volume:  Normal  Mood:  Anxious and Irritable  Affect:  Congruent  Thought Process:  Goal Directed  Orientation:  Full (Time, Place, and Person)  Thought Content: Rumination   Suicidal Thoughts:  No  Homicidal Thoughts:  No  Memory:  Immediate;   Good Recent;   Good Remote;   Good  Judgement:  Fair  Insight:  Fair  Psychomotor Activity:  Normal  Concentration:  Concentration: Good and Attention Span: Good  Recall:  Good  Fund of Knowledge: Good  Language: Good  Akathisia:  No  Handed:   Right  AIMS (if indicated): not done  Assets:  Communication Skills Desire for Improvement Housing Resilience  ADL's:  Intact  Cognition: WNL  Sleep:  Fair   Screenings: PHQ2-9     Office Visit from 03/29/2015 in Dr. Claudette [facility] Office Visit from 02/10/2015 in [facility] Office Visit from 01/24/2015 in [facility] Office Visit from 01/03/2015 in [facility] Office Visit from 12/03/2014 in [facility]  PHQ-2 Total Score  0  0  0  0  0  PHQ-9 Total Score  0  -  -  -  -       Assessment and Plan: Generalized anxiety disorder.  Discussed psychosocial stressors.  Patient like to continue Cymbalta because he believe it helps his anxiety and chronic pain.  However he is frustrated with his scrotal injury and he has extensive work-up with poor outcome.  He is scheduled to see urology again next week.  Discussed medication side effects and benefits.  Patient is not interested in counseling.  He like to continue Cymbalta 60 mg daily.  I recommended to call us back if he has any question, concern if he feels worsening of the symptoms.  Follow-up in 6 months.  Discussed risk of noncompliance with medication.   Cleotis Nipper, MD 01/02/2018, 10:13 AM

## 2018-01-09 DIAGNOSIS — E291 Testicular hypofunction: Secondary | ICD-10-CM | POA: Diagnosis not present

## 2018-01-16 DIAGNOSIS — R3912 Poor urinary stream: Secondary | ICD-10-CM | POA: Diagnosis not present

## 2018-01-16 DIAGNOSIS — N401 Enlarged prostate with lower urinary tract symptoms: Secondary | ICD-10-CM | POA: Diagnosis not present

## 2018-01-16 DIAGNOSIS — E291 Testicular hypofunction: Secondary | ICD-10-CM | POA: Diagnosis not present

## 2018-01-16 DIAGNOSIS — N200 Calculus of kidney: Secondary | ICD-10-CM | POA: Diagnosis not present

## 2018-01-23 ENCOUNTER — Other Ambulatory Visit: Payer: Self-pay | Admitting: Urology

## 2018-01-27 ENCOUNTER — Encounter (HOSPITAL_COMMUNITY): Payer: Self-pay | Admitting: *Deleted

## 2018-02-02 NOTE — H&P (Signed)
Urology Preoperative H&P   Chief Complaint:   History of Present Illness: Nicholas Mcintosh is a 52 y.o. male with a history of, erectile dysfunction, hypogonadism and kidney stones.   1. Kidney stones- States that he has passed several small stones over the past 6 months. No associated pain with the recent stone passage. Denies flank pain, interval UTIs, dysuria or hematuria. KUB from shows several 5 mm bilateral stones R>L.   2. Hypogonadism- Currently on anastrozole 2 mg daily and Clomid 50 mg daily. He reports improved energy and libido, but is having occasional night sweats.   Last testosterone- 534.6 (12/2017), 447.3 (07/2017)  Last Hct- 39.7 (12/2017) Stable  Last PSA- 1.06 (07/2017)   3. ED- Currently takes Viagra 50 mg with a good response.     Past Medical History:  Diagnosis Date  . Chronic back pain    hx fall 2006 from 3 stories , vertebral fx  . Complication of anesthesia   . ED (erectile dysfunction)   . Exercise-induced asthma   . History of concussion    2006 from fall  . History of kidney stones   . Hypertension   . Hypothyroidism   . Nephrolithiasis    bilateral  . PONV (postoperative nausea and vomiting)   . Right ureteral stone     Past Surgical History:  Procedure Laterality Date  . CARDIAC CATHETERIZATION  07-12-2011   dr Sharyn Lull   abnormal myoview/  normal coronary arteries and normal LVSF  . CARDIOVASCULAR STRESS TEST  06-15-2011   dr Sharyn Lull   mild reversible periinfarct anterior wall ischemia, normal wall motion , ef 49%  . COLONOSCOPY WITH PROPOFOL N/A 12/03/2016   Procedure: COLONOSCOPY WITH PROPOFOL;  Surgeon: Charolett Bumpers, MD;  Location: [facility];  Service: Endoscopy;  Laterality: N/A;  . CYSTOSCOPY WITH HOLMIUM LASER LITHOTRIPSY Left 02/15/2015   Procedure: CYSTOSCOPY WITH HOLMIUM LASER LITHOTRIPSY;  Surgeon: Jethro Bolus, MD;  Location: [facility];  Service: Urology;  Laterality: Left;  . CYSTOSCOPY WITH STENT PLACEMENT Left 02/15/2015    Procedure: CYSTOSCOPY WITH STENT PLACEMENT LEFT URETER;  Surgeon: Jethro Bolus, MD;  Location: [facility];  Service: Urology;  Laterality: Left;  . CYSTOSCOPY/RETROGRADE/URETEROSCOPY/STONE EXTRACTION WITH BASKET Left 02/15/2015   Procedure: CYSTOSCOPY/RETROGRADE/URETEROSCOPY/STONE EXTRACTION WITH BASKET;  Surgeon: Jethro Bolus, MD;  Location: [facility];  Service: Urology;  Laterality: Left;  Marland Kitchen VARICOCELECTOMY  2001 ?    Allergies:  Allergies  Allergen Reactions  . Molds & Smuts     Asthma attacks around molds  . Dust Mite Extract Other (See Comments)    Ashtma    Family History  Problem Relation Age of Onset  . Hypertension Mother   . Cancer Maternal Aunt        Breast    Social History:  reports that he has never smoked. He has never used smokeless tobacco. He reports that he does not drink alcohol or use drugs.  ROS: A complete review of systems was performed.  All systems are negative except for pertinent findings as noted.  Physical Exam:  Vital signs in last 24 hours:   Constitutional:  Alert and oriented, No acute distress Cardiovascular: Regular rate and rhythm, No JVD Respiratory: Normal respiratory effort, Lungs clear bilaterally GI: Abdomen is soft, nontender, nondistended, no abdominal masses GU: No CVA tenderness Lymphatic: No lymphadenopathy Neurologic: Grossly intact, no focal deficits Psychiatric: Normal mood and affect  Laboratory Data:  No results for input(s): WBC, HGB, HCT, PLT in the last 72 hours.  No results for input(s): NA, K, CL, GLUCOSE, BUN, CALCIUM, CREATININE in the last 72 hours.  Invalid input(s): CO3   No results found for this or any previous visit (from the past 24 hour(s)). No results found for this or any previous visit (from the past 240 hour(s)).  Renal Function: No results for input(s): CREATININE in the last 168 hours. CrCl cannot be calculated (Patient's most recent lab result is older than the maximum 21 days  allowed.).  Radiologic Imaging: No results found.  I independently reviewed the above imaging studies.  Assessment and Plan Nicholas Mcintosh is a 52 y.o. male with multiple right renal stones  -The risks, benefits and alternatives of RIGHT ESWL was discussed with the patient. I described the risks which include arrhythmia, kidney contusion, kidney hemorrhage, need for transfusion, back discomfort, flank ecchymosis, flank abrasion, inability to break up stone, inability to pass stone fragments, Steinstrasse, infection associated with obstructing stones, need for different surgical procedure and possible need for repeat shockwave lithotripsy. The patient voices understanding and wishes to proceed.    Rhoderick Moodyhristopher Dymir Neeson, MD 02/02/2018, 11:52 PM  [facility] Pager: [phone]

## 2018-02-03 ENCOUNTER — Encounter (HOSPITAL_COMMUNITY): Payer: Self-pay | Admitting: General Practice

## 2018-02-03 ENCOUNTER — Ambulatory Visit (HOSPITAL_COMMUNITY): Payer: 59

## 2018-02-03 ENCOUNTER — Ambulatory Visit (HOSPITAL_COMMUNITY)
Admission: RE | Admit: 2018-02-03 | Discharge: 2018-02-03 | Disposition: A | Discharge status: Home / Self Care | Payer: 59 | Source: Ambulatory Visit | Attending: Urology | Admitting: Urology

## 2018-02-03 ENCOUNTER — Ambulatory Visit (HOSPITAL_COMMUNITY): Payer: Self-pay | Admitting: Psychiatry

## 2018-02-03 ENCOUNTER — Encounter (HOSPITAL_COMMUNITY)
Admission: RE | Disposition: A | Discharge status: Home / Self Care | Payer: Self-pay | Source: Ambulatory Visit | Attending: Urology

## 2018-02-03 DIAGNOSIS — N529 Male erectile dysfunction, unspecified: Secondary | ICD-10-CM | POA: Diagnosis not present

## 2018-02-03 DIAGNOSIS — I10 Essential (primary) hypertension: Secondary | ICD-10-CM | POA: Insufficient documentation

## 2018-02-03 DIAGNOSIS — E291 Testicular hypofunction: Secondary | ICD-10-CM | POA: Insufficient documentation

## 2018-02-03 DIAGNOSIS — N200 Calculus of kidney: Secondary | ICD-10-CM | POA: Insufficient documentation

## 2018-02-03 HISTORY — PX: EXTRACORPOREAL SHOCK WAVE LITHOTRIPSY: SHX1557

## 2018-02-03 SURGERY — LITHOTRIPSY, ESWL
Anesthesia: LOCAL | Laterality: Right

## 2018-02-03 MED ORDER — DIAZEPAM 5 MG PO TABS
10.0000 mg | ORAL_TABLET | ORAL | Status: AC
  Administered 2018-02-03: 10 mg via ORAL
  Filled 2018-02-03: qty 2

## 2018-02-03 MED ORDER — DIPHENHYDRAMINE HCL 25 MG PO CAPS
25.0000 mg | ORAL_CAPSULE | ORAL | Status: AC
  Administered 2018-02-03: 25 mg via ORAL
  Filled 2018-02-03: qty 1

## 2018-02-03 MED ORDER — CIPROFLOXACIN HCL 500 MG PO TABS
500.0000 mg | ORAL_TABLET | ORAL | Status: AC
  Administered 2018-02-03: 500 mg via ORAL
  Filled 2018-02-03: qty 1

## 2018-02-03 MED ORDER — SODIUM CHLORIDE 0.9 % IV SOLN
INTRAVENOUS | Status: DC
  Administered 2018-02-03: 09:00:00 via INTRAVENOUS

## 2018-02-03 MED FILL — ONDANSETRON HCL 4 MG TABLET: 4 | 5 days supply | Qty: 30 | Fill #0

## 2018-02-03 NOTE — Op Note (Signed)
[facility] Operative Note  Treating Physician: Rhoderick Moodyhristopher Beth Goodlin, MD  Pre-op diagnosis: 5 mm right renal stones  Post-op diagnosis: Same   Procedure: Right ESWL  [facility] into chart. Also because of the size, density, location and other factors that cannot be anticipated I feel this will likely be a staged procedure. This fact supersedes any indication in the scanned [facility] operative note to the contrary

## 2018-02-10 MED FILL — CLOMIPHENE CITRATE 50 MG TA: 50 | 30 days supply | Qty: 30 | Fill #6

## 2018-02-14 DIAGNOSIS — I2510 Atherosclerotic heart disease of native coronary artery without angina pectoris: Secondary | ICD-10-CM | POA: Diagnosis not present

## 2018-02-14 DIAGNOSIS — E039 Hypothyroidism, unspecified: Secondary | ICD-10-CM | POA: Diagnosis not present

## 2018-02-14 DIAGNOSIS — F419 Anxiety disorder, unspecified: Secondary | ICD-10-CM | POA: Diagnosis not present

## 2018-02-14 DIAGNOSIS — I10 Essential (primary) hypertension: Secondary | ICD-10-CM | POA: Diagnosis not present

## 2018-02-14 DIAGNOSIS — E785 Hyperlipidemia, unspecified: Secondary | ICD-10-CM | POA: Diagnosis not present

## 2018-02-14 DIAGNOSIS — N200 Calculus of kidney: Secondary | ICD-10-CM | POA: Diagnosis not present

## 2018-02-14 DIAGNOSIS — G894 Chronic pain syndrome: Secondary | ICD-10-CM | POA: Diagnosis not present

## 2018-02-27 DIAGNOSIS — N200 Calculus of kidney: Secondary | ICD-10-CM | POA: Diagnosis not present

## 2018-03-04 MED FILL — FOLIC ACID 1 MG TABS: 1 | 90 days supply | Qty: 90 | Fill #0

## 2018-03-04 MED FILL — LEVOTHYROXINE 50 MCG TABLET: 50 | 90 days supply | Qty: 90 | Fill #1

## 2018-03-05 DIAGNOSIS — Z1389 Encounter for screening for other disorder: Secondary | ICD-10-CM | POA: Diagnosis not present

## 2018-03-05 DIAGNOSIS — G894 Chronic pain syndrome: Secondary | ICD-10-CM | POA: Diagnosis not present

## 2018-03-05 DIAGNOSIS — E039 Hypothyroidism, unspecified: Secondary | ICD-10-CM | POA: Diagnosis not present

## 2018-03-05 DIAGNOSIS — I10 Essential (primary) hypertension: Secondary | ICD-10-CM | POA: Diagnosis not present

## 2018-03-05 DIAGNOSIS — F324 Major depressive disorder, single episode, in partial remission: Secondary | ICD-10-CM | POA: Diagnosis not present

## 2018-03-05 MED FILL — DOXAZOSIN MESYLATE 8 MG TAB: 8 | 90 days supply | Qty: 90 | Fill #0

## 2018-03-19 MED FILL — CLOMIPHENE CITRATE 50 MG TA: 50 | 30 days supply | Qty: 30 | Fill #7

## 2018-03-24 DIAGNOSIS — M545 Low back pain: Secondary | ICD-10-CM | POA: Diagnosis not present

## 2018-04-07 MED FILL — AMLODIPINE-VALSARTAN 5-320: 5-320 | 90 days supply | Qty: 90 | Fill #2

## 2018-04-10 DIAGNOSIS — M545 Low back pain: Secondary | ICD-10-CM | POA: Diagnosis not present

## 2018-04-21 DIAGNOSIS — M25551 Pain in right hip: Secondary | ICD-10-CM | POA: Diagnosis not present

## 2018-04-21 DIAGNOSIS — M545 Low back pain: Secondary | ICD-10-CM | POA: Diagnosis not present

## 2018-04-21 MED FILL — DULoxetine HCL 60 MG CPEP: 60 | 90 days supply | Qty: 90 | Fill #1

## 2018-04-23 MED FILL — ANASTROZOLE 1 MG TABLET: 1 | 90 days supply | Qty: 180 | Fill #0

## 2018-04-28 MED FILL — CLOMIPHENE CITRATE 50 MG TA: 50 | 30 days supply | Qty: 30 | Fill #8

## 2018-05-08 MED FILL — VENTOLIN HFA 90 MCG INHALER: 108 (90 BAS | 50 days supply | Qty: 54 | Fill #0

## 2018-05-16 DIAGNOSIS — E039 Hypothyroidism, unspecified: Secondary | ICD-10-CM | POA: Diagnosis not present

## 2018-05-16 DIAGNOSIS — G4733 Obstructive sleep apnea (adult) (pediatric): Secondary | ICD-10-CM | POA: Diagnosis not present

## 2018-05-16 DIAGNOSIS — I2510 Atherosclerotic heart disease of native coronary artery without angina pectoris: Secondary | ICD-10-CM | POA: Diagnosis not present

## 2018-05-16 DIAGNOSIS — E785 Hyperlipidemia, unspecified: Secondary | ICD-10-CM | POA: Diagnosis not present

## 2018-05-16 DIAGNOSIS — G894 Chronic pain syndrome: Secondary | ICD-10-CM | POA: Diagnosis not present

## 2018-05-16 DIAGNOSIS — I10 Essential (primary) hypertension: Secondary | ICD-10-CM | POA: Diagnosis not present

## 2018-05-16 DIAGNOSIS — F419 Anxiety disorder, unspecified: Secondary | ICD-10-CM | POA: Diagnosis not present

## 2018-05-16 DIAGNOSIS — N200 Calculus of kidney: Secondary | ICD-10-CM | POA: Diagnosis not present

## 2018-05-19 ENCOUNTER — Encounter (HOSPITAL_COMMUNITY): Payer: Self-pay | Admitting: Urology

## 2018-06-10 MED FILL — LEVOTHYROXINE 50 MCG TABLET: 50 | 90 days supply | Qty: 90 | Fill #2

## 2018-06-12 MED FILL — CLOMIPHENE CITRATE 50 MG TA: 50 | 30 days supply | Qty: 30 | Fill #9

## 2018-06-24 DIAGNOSIS — M25551 Pain in right hip: Secondary | ICD-10-CM | POA: Diagnosis not present

## 2018-07-04 ENCOUNTER — Ambulatory Visit (HOSPITAL_COMMUNITY): Payer: Self-pay | Admitting: Psychiatry

## 2018-07-07 DIAGNOSIS — R3912 Poor urinary stream: Secondary | ICD-10-CM | POA: Diagnosis not present

## 2018-07-07 DIAGNOSIS — N401 Enlarged prostate with lower urinary tract symptoms: Secondary | ICD-10-CM | POA: Diagnosis not present

## 2018-07-07 DIAGNOSIS — E291 Testicular hypofunction: Secondary | ICD-10-CM | POA: Diagnosis not present

## 2018-07-08 MED FILL — DOXAZOSIN MESYLATE 8 MG TAB: 8 | 90 days supply | Qty: 90 | Fill #1

## 2018-07-08 MED FILL — ANASTROZOLE 1 MG TABLET: 1 | 90 days supply | Qty: 180 | Fill #1

## 2018-07-08 MED FILL — FOLIC ACID 1 MG TABS: 1 | 90 days supply | Qty: 90 | Fill #1

## 2018-07-14 DIAGNOSIS — Z23 Encounter for immunization: Secondary | ICD-10-CM | POA: Diagnosis not present

## 2018-07-14 DIAGNOSIS — Z0000 Encounter for general adult medical examination without abnormal findings: Secondary | ICD-10-CM | POA: Diagnosis not present

## 2018-07-14 DIAGNOSIS — Z1389 Encounter for screening for other disorder: Secondary | ICD-10-CM | POA: Diagnosis not present

## 2018-07-14 DIAGNOSIS — E039 Hypothyroidism, unspecified: Secondary | ICD-10-CM | POA: Diagnosis not present

## 2018-07-14 DIAGNOSIS — Z125 Encounter for screening for malignant neoplasm of prostate: Secondary | ICD-10-CM | POA: Diagnosis not present

## 2018-07-14 DIAGNOSIS — E291 Testicular hypofunction: Secondary | ICD-10-CM | POA: Diagnosis not present

## 2018-07-14 DIAGNOSIS — Z13220 Encounter for screening for lipoid disorders: Secondary | ICD-10-CM | POA: Diagnosis not present

## 2018-07-14 DIAGNOSIS — M1990 Unspecified osteoarthritis, unspecified site: Secondary | ICD-10-CM | POA: Diagnosis not present

## 2018-07-14 DIAGNOSIS — F324 Major depressive disorder, single episode, in partial remission: Secondary | ICD-10-CM | POA: Diagnosis not present

## 2018-07-14 DIAGNOSIS — G894 Chronic pain syndrome: Secondary | ICD-10-CM | POA: Diagnosis not present

## 2018-07-14 DIAGNOSIS — N200 Calculus of kidney: Secondary | ICD-10-CM | POA: Diagnosis not present

## 2018-07-14 DIAGNOSIS — R7309 Other abnormal glucose: Secondary | ICD-10-CM | POA: Diagnosis not present

## 2018-07-14 DIAGNOSIS — I10 Essential (primary) hypertension: Secondary | ICD-10-CM | POA: Diagnosis not present

## 2018-07-17 DIAGNOSIS — N400 Enlarged prostate without lower urinary tract symptoms: Secondary | ICD-10-CM | POA: Diagnosis not present

## 2018-07-17 DIAGNOSIS — N5201 Erectile dysfunction due to arterial insufficiency: Secondary | ICD-10-CM | POA: Diagnosis not present

## 2018-07-17 DIAGNOSIS — N200 Calculus of kidney: Secondary | ICD-10-CM | POA: Diagnosis not present

## 2018-07-17 DIAGNOSIS — E291 Testicular hypofunction: Secondary | ICD-10-CM | POA: Diagnosis not present

## 2018-07-18 MED FILL — AMLODIPINE-VALSARTAN 5-320: 5-320 | 90 days supply | Qty: 90 | Fill #3

## 2018-07-21 MED FILL — CLOMIPHENE CITRATE 50 MG TA: 50 | 30 days supply | Qty: 30 | Fill #0

## 2018-08-11 DIAGNOSIS — T781XXD Other adverse food reactions, not elsewhere classified, subsequent encounter: Secondary | ICD-10-CM | POA: Diagnosis not present

## 2018-08-11 DIAGNOSIS — J3089 Other allergic rhinitis: Secondary | ICD-10-CM | POA: Diagnosis not present

## 2018-08-11 DIAGNOSIS — J301 Allergic rhinitis due to pollen: Secondary | ICD-10-CM | POA: Diagnosis not present

## 2018-08-11 DIAGNOSIS — J4540 Moderate persistent asthma, uncomplicated: Secondary | ICD-10-CM | POA: Diagnosis not present

## 2018-08-11 MED FILL — MONTELUKAST SOD 10 MG TAB: 10 | 30 days supply | Qty: 30 | Fill #0

## 2018-08-11 MED FILL — MICROCHAMBER: 30 days supply | Qty: 1 | Fill #0

## 2018-08-11 MED FILL — SYMBICORT 160-4.5 MCG INH: 160-4.5 | 30 days supply | Qty: 10 | Fill #0

## 2018-08-15 DIAGNOSIS — F419 Anxiety disorder, unspecified: Secondary | ICD-10-CM | POA: Diagnosis not present

## 2018-08-15 DIAGNOSIS — I10 Essential (primary) hypertension: Secondary | ICD-10-CM | POA: Diagnosis not present

## 2018-08-15 DIAGNOSIS — G4733 Obstructive sleep apnea (adult) (pediatric): Secondary | ICD-10-CM | POA: Diagnosis not present

## 2018-08-15 DIAGNOSIS — D649 Anemia, unspecified: Secondary | ICD-10-CM | POA: Diagnosis not present

## 2018-08-15 DIAGNOSIS — E785 Hyperlipidemia, unspecified: Secondary | ICD-10-CM | POA: Diagnosis not present

## 2018-08-15 DIAGNOSIS — I2510 Atherosclerotic heart disease of native coronary artery without angina pectoris: Secondary | ICD-10-CM | POA: Diagnosis not present

## 2018-08-15 DIAGNOSIS — N200 Calculus of kidney: Secondary | ICD-10-CM | POA: Diagnosis not present

## 2018-08-15 DIAGNOSIS — E039 Hypothyroidism, unspecified: Secondary | ICD-10-CM | POA: Diagnosis not present

## 2018-08-15 DIAGNOSIS — G894 Chronic pain syndrome: Secondary | ICD-10-CM | POA: Diagnosis not present

## 2018-08-19 DIAGNOSIS — M25551 Pain in right hip: Secondary | ICD-10-CM | POA: Diagnosis not present

## 2018-08-19 DIAGNOSIS — M545 Low back pain: Secondary | ICD-10-CM | POA: Diagnosis not present

## 2018-08-25 DIAGNOSIS — R945 Abnormal results of liver function studies: Secondary | ICD-10-CM | POA: Diagnosis not present

## 2018-08-25 DIAGNOSIS — R740 Nonspecific elevation of levels of transaminase and lactic acid dehydrogenase [LDH]: Secondary | ICD-10-CM | POA: Diagnosis not present

## 2018-08-27 ENCOUNTER — Other Ambulatory Visit (HOSPITAL_COMMUNITY): Payer: Self-pay | Admitting: Psychiatry

## 2018-08-27 DIAGNOSIS — F411 Generalized anxiety disorder: Secondary | ICD-10-CM

## 2018-08-28 ENCOUNTER — Ambulatory Visit (INDEPENDENT_AMBULATORY_CARE_PROVIDER_SITE_OTHER): Payer: 59 | Admitting: Psychiatry

## 2018-08-28 ENCOUNTER — Encounter (HOSPITAL_COMMUNITY): Payer: Self-pay | Admitting: Psychiatry

## 2018-08-28 DIAGNOSIS — F411 Generalized anxiety disorder: Secondary | ICD-10-CM

## 2018-08-28 MED ORDER — DULOXETINE HCL 60 MG PO CPEP: 60.0000 mg | ORAL_CAPSULE | Freq: Every day | ORAL | 1 refills | Status: DC

## 2018-08-28 MED FILL — CLOMIPHENE CITRATE 50 MG TA: 50 | 30 days supply | Qty: 30 | Fill #1

## 2018-08-28 MED FILL — DULOXETINE HCL 60 MG CPEP: 60 | 90 days supply | Qty: 90 | Fill #0

## 2018-08-28 NOTE — Progress Notes (Signed)
[facility] MD/PA/NP OP Progress Note  08/28/2018 9:00 AM Nicholas Mcintosh  MRN:  [id number]  Chief Complaint: I am doing fine.  I think Cymbalta helping my anxiety.  HPI: Nicholas Mcintosh came for his follow-up appointment.  He was last seen in June.  He is taking Cymbalta 60 mg daily.  He has no tremors shakes or any EPS.  Overall he noticed that Cymbalta helps his anxiety and nervousness.  Though he continues to struggles with frustration and sadness about his long-term scrotal injury which did not resolve and having difficulty performing sex but now he is in acceptance of the situation.  He is sleeping 6 hours.  His energy level is fair.  Lately he has been taking care of his elderly mother and does not go to work on a regular basis unless they need to fill in.  He is seeing Dr. Eula ListenHussain for his health needs.  He admitted that he has a high liver enzyme because he was using beat powder but since he stopped his liver enzyme is back to normal.  Patient denies any paranoia, hallucination, crying spells or any feeling of hopelessness or worthlessness.  He lives with his wife who is very supportive.  Patient denies drinking or using any illegal substances.  He has chronic joint pain.  His appetite is okay and his vital signs are stable.  Visit Diagnosis:    ICD-10-CM   1. Generalized anxiety disorder F41.1 DULoxetine (CYMBALTA) 60 MG capsule    Past Psychiatric History: Reviewed. No history of suicidal attempt or any psychiatric inpatient treatment. Took Cymbalta since seeing this Clinical research associatewriter. Recommended to add Wellbutrin but he never filled the prescription.   Past Medical History:  Past Medical History:  Diagnosis Date  . Chronic back pain    hx fall 2006 from 3 stories , vertebral fx  . Complication of anesthesia   . ED (erectile dysfunction)   . Exercise-induced asthma   . History of concussion    2006 from fall  . History of kidney stones   . Hypertension   . Hypothyroidism   . Nephrolithiasis     bilateral  . PONV (postoperative nausea and vomiting)   . Right ureteral stone     Past Surgical History:  Procedure Laterality Date  . CARDIAC CATHETERIZATION  07-12-2011   dr Sharyn Lullharwani   abnormal myoview/  normal coronary arteries and normal LVSF  . CARDIOVASCULAR STRESS TEST  06-15-2011   dr Sharyn Lullharwani   mild reversible periinfarct anterior wall ischemia, normal wall motion , ef 49%  . COLONOSCOPY WITH PROPOFOL N/A 12/03/2016   Procedure: COLONOSCOPY WITH PROPOFOL;  Surgeon: Charolett BumpersJohnson, Martin K, MD;  Location: [facility];  Service: Endoscopy;  Laterality: N/A;  . CYSTOSCOPY WITH HOLMIUM LASER LITHOTRIPSY Left 02/15/2015   Procedure: CYSTOSCOPY WITH HOLMIUM LASER LITHOTRIPSY;  Surgeon: Jethro BolusSigmund Tannenbaum, MD;  Location: [facility];  Service: Urology;  Laterality: Left;  . CYSTOSCOPY WITH STENT PLACEMENT Left 02/15/2015   Procedure: CYSTOSCOPY WITH STENT PLACEMENT LEFT URETER;  Surgeon: Jethro BolusSigmund Tannenbaum, MD;  Location: [facility];  Service: Urology;  Laterality: Left;  . CYSTOSCOPY/RETROGRADE/URETEROSCOPY/STONE EXTRACTION WITH BASKET Left 02/15/2015   Procedure: CYSTOSCOPY/RETROGRADE/URETEROSCOPY/STONE EXTRACTION WITH BASKET;  Surgeon: Jethro BolusSigmund Tannenbaum, MD;  Location: [facility];  Service: Urology;  Laterality: Left;  . EXTRACORPOREAL SHOCK WAVE LITHOTRIPSY Right 02/03/2018   Procedure: RIGHT EXTRACORPOREAL SHOCK WAVE LITHOTRIPSY ([facility]);  Surgeon: Rene PaciWinter, Christopher Aaron, MD;  Location: [facility];  Service: Urology;  Laterality: Right;  Marland Kitchen. VARICOCELECTOMY  2001 ?    Family  Psychiatric History: Viewed.  Family History:  Family History  Problem Relation Age of Onset  . Hypertension Mother   . Cancer Maternal Aunt        Breast    Social History:  Social History   Socioeconomic History  . Marital status: Married    Spouse name: Not on file  . Number of children: Not on file  . Years of education: Not on file  . Highest education level: Not on file  Occupational History  . Not on file  Social  Needs  . Financial resource strain: Not on file  . Food insecurity:    Worry: Not on file    Inability: Not on file  . Transportation needs:    Medical: Not on file    Non-medical: Not on file  Tobacco Use  . Smoking status: Never Smoker  . Smokeless tobacco: Never Used  Substance and Sexual Activity  . Alcohol use: No    Alcohol/week: 0.0 standard drinks  . Drug use: No  . Sexual activity: Never  Lifestyle  . Physical activity:    Days per week: Not on file    Minutes per session: Not on file  . Stress: Not on file  Relationships  . Social connections:    Talks on phone: Not on file    Gets together: Not on file    Attends religious service: Not on file    Active member of club or organization: Not on file    Attends meetings of clubs or organizations: Not on file    Relationship status: Not on file  Other Topics Concern  . Not on file  Social History Narrative  . Not on file    Allergies:  Allergies  Allergen Reactions  . Molds & Smuts     Asthma attacks around molds  . Dust Mite Extract Other (See Comments)    Ashtma    Metabolic Disorder Labs: Lab Results  Component Value Date   HGBA1C 5.6 04/15/2014   MPG 114 04/15/2014   MPG 105 01/11/2014   Lab Results  Component Value Date   PROLACTIN 4.7 04/28/2014   Lab Results  Component Value Date   CHOL 126 02/10/2015   TRIG 152 (H) 02/10/2015   HDL 36 (L) 02/10/2015   CHOLHDL 3.5 02/10/2015   VLDL 30 02/10/2015   LDLCALC 60 02/10/2015   LDLCALC 59 04/15/2014   Lab Results  Component Value Date   TSH 1.123 02/10/2015   TSH 1.359 01/05/2013    Therapeutic Level Labs: No results found for: LITHIUM No results found for: VALPROATE No components found for:  CBMZ  Current Medications: Current Outpatient Medications  Medication Sig Dispense Refill  . albuterol (PROVENTIL HFA;VENTOLIN HFA) 108 (90 BASE) MCG/ACT inhaler Inhale 2 puffs into the lungs every 4 (four) hours as needed for wheezing or  shortness of breath. 1 Inhaler 3  . amLODipine-valsartan (EXFORGE) 5-320 MG per tablet Take 1 tablet by mouth daily. 30 tablet 2  . amoxicillin (AMOXIL) 875 MG tablet Take 1 tablet (875 mg total) by mouth 2 (two) times daily. 20 tablet 0  . anastrozole (ARIMIDEX) 1 MG tablet Take 2 mg by mouth daily.   3  . doxazosin (CARDURA) 4 MG tablet Take 1 tablet (4 mg total) by mouth daily. 30 tablet 3  . DULoxetine (CYMBALTA) 60 MG capsule Take 1 capsule (60 mg total) by mouth daily. 90 capsule 1  . folic acid (FOLVITE) 1 MG tablet TAKE 1 TABLET BY  MOUTH DAILY. 30 tablet 3  . levothyroxine (SYNTHROID, LEVOTHROID) 50 MCG tablet TAKE 1 TABLET BY MOUTH ONCE DAILY 90 tablet 0  . oxyCODONE-acetaminophen (PERCOCET/ROXICET) 5-325 MG tablet Take 1 tablet by mouth 3 (three) times daily.   0  . sildenafil (VIAGRA) 100 MG tablet Take 1 tablet (100 mg total) by mouth daily as needed for erectile dysfunction. 10 tablet 0   No current facility-administered medications for this visit.      Musculoskeletal: Strength & Muscle Tone: within normal limits Gait & Station: normal Patient leans: N/A  Psychiatric Specialty Exam: ROS  Blood pressure 132/78, height 5\' 6"  (1.676 m), weight 234 lb (106.1 kg).Body mass index is 37.77 kg/m.  General Appearance: Casual  Eye Contact:  Good  Speech:  Clear and Coherent  Volume:  Normal  Mood:  Anxious  Affect:  Congruent  Thought Process:  Goal Directed  Orientation:  Full (Time, Place, and Person)  Thought Content: Rumination   Suicidal Thoughts:  No  Homicidal Thoughts:  No  Memory:  Immediate;   Good Recent;   Good Remote;   Good  Judgement:  Good  Insight:  Good  Psychomotor Activity:  Normal  Concentration:  Concentration: Good and Attention Span: Good  Recall:  Good  Fund of Knowledge: Good  Language: Good  Akathisia:  No  Handed:  Right  AIMS (if indicated): not done  Assets:  Communication Skills Desire for Improvement Housing Physical  Health Resilience Social Support  ADL's:  Intact  Cognition: WNL  Sleep:  Fair   Screenings: PHQ2-9     Office Visit from 03/29/2015 in Dr. Claudette [facility] [place] Office Visit from 02/10/2015 in [facility] Office Visit from 01/24/2015 in [facility] Office Visit from 01/03/2015 in [facility] Office Visit from 12/03/2014 in [facility]  PHQ-2 Total Score  0  0  0  0  0  PHQ-9 Total Score  0  -  -  -  -       Assessment and Plan: Generalized anxiety disorder.  Patient is stable on Cymbalta 60 mg daily.  He has no tremors, shakes or any EPS.  We will contact Dr. Eula Listen to get his blood work which was done recently.  Patient is not interested in therapy.  Recommended to call us back if he has any question or any concern.  Continue Cymbalta 60 mg daily.  Follow-up in 6 months.   Cleotis Nipper, MD 08/28/2018, 9:00 AM

## 2018-09-12 MED FILL — MONTELUKAST SOD 10 MG TAB: 10 | 30 days supply | Qty: 30 | Fill #1

## 2018-09-17 MED FILL — LEVOTHYROXINE 50 MCG TABLET: 50 | 90 days supply | Qty: 90 | Fill #0

## 2018-10-08 MED FILL — SILDENAFIL CITRATE 100 MG T: 100 | 90 days supply | Qty: 18 | Fill #0

## 2018-10-08 MED FILL — SYMBICORT 160-4.5 MCG INH: 160-4.5 | 30 days supply | Qty: 10 | Fill #1

## 2018-10-13 MED FILL — CLOMIPHENE CITRATE 50 MG TA: 50 | 30 days supply | Qty: 30 | Fill #2

## 2018-10-27 MED FILL — DOXAZOSIN MESYLATE 8 MG TAB: 8 | 90 days supply | Qty: 90 | Fill #2

## 2018-10-27 MED FILL — MONTELUKAST SOD 10 MG TAB: 10 | 30 days supply | Qty: 30 | Fill #2

## 2018-10-27 MED FILL — FOLIC ACID 1 MG TABS: 1 | 90 days supply | Qty: 90 | Fill #2

## 2018-10-29 MED FILL — AMLODIPINE-VALSARTAN 5-320: 5-320 | 90 days supply | Qty: 90 | Fill #0

## 2018-11-05 MED FILL — MONTELUKAST SOD 10 MG TAB: 10 | 90 days supply | Qty: 90 | Fill #0

## 2018-11-13 DIAGNOSIS — G894 Chronic pain syndrome: Secondary | ICD-10-CM | POA: Diagnosis not present

## 2018-11-13 DIAGNOSIS — I10 Essential (primary) hypertension: Secondary | ICD-10-CM | POA: Diagnosis not present

## 2018-11-13 DIAGNOSIS — E291 Testicular hypofunction: Secondary | ICD-10-CM | POA: Diagnosis not present

## 2018-11-13 DIAGNOSIS — E039 Hypothyroidism, unspecified: Secondary | ICD-10-CM | POA: Diagnosis not present

## 2018-11-13 DIAGNOSIS — J309 Allergic rhinitis, unspecified: Secondary | ICD-10-CM | POA: Diagnosis not present

## 2018-11-13 DIAGNOSIS — F324 Major depressive disorder, single episode, in partial remission: Secondary | ICD-10-CM | POA: Diagnosis not present

## 2018-11-17 DIAGNOSIS — E039 Hypothyroidism, unspecified: Secondary | ICD-10-CM | POA: Diagnosis not present

## 2018-11-18 MED FILL — CLOMIPHENE CITRATE 50 MG TA: 50 | 30 days supply | Qty: 30 | Fill #3

## 2018-12-01 MED FILL — ANASTROZOLE 1 MG TABLET: 1 | 90 days supply | Qty: 180 | Fill #0

## 2018-12-10 MED FILL — LEVOTHYROXINE 50 MCG TABLET: 50 | 90 days supply | Qty: 90 | Fill #1

## 2018-12-10 MED FILL — DULOXETINE HCL 60 MG CPEP: 60 | 90 days supply | Qty: 90 | Fill #1

## 2018-12-18 DIAGNOSIS — J301 Allergic rhinitis due to pollen: Secondary | ICD-10-CM | POA: Diagnosis not present

## 2018-12-18 DIAGNOSIS — J4540 Moderate persistent asthma, uncomplicated: Secondary | ICD-10-CM | POA: Diagnosis not present

## 2018-12-18 DIAGNOSIS — J3089 Other allergic rhinitis: Secondary | ICD-10-CM | POA: Diagnosis not present

## 2018-12-18 DIAGNOSIS — T781XXA Other adverse food reactions, not elsewhere classified, initial encounter: Secondary | ICD-10-CM | POA: Diagnosis not present

## 2018-12-30 MED FILL — SYMBICORT 160-4.5 MCG INH: 160-4.5 | 30 days supply | Qty: 10 | Fill #2

## 2019-01-01 MED FILL — CLOMIPHENE CITRATE 50 MG TA: 50 | 30 days supply | Qty: 30 | Fill #4

## 2019-01-05 ENCOUNTER — Encounter: Payer: Self-pay | Admitting: Podiatry

## 2019-01-05 ENCOUNTER — Ambulatory Visit: Payer: 59 | Admitting: Podiatry

## 2019-01-05 ENCOUNTER — Ambulatory Visit (INDEPENDENT_AMBULATORY_CARE_PROVIDER_SITE_OTHER): Payer: 59

## 2019-01-05 ENCOUNTER — Other Ambulatory Visit: Payer: Self-pay

## 2019-01-05 VITALS — Temp 97.7°F

## 2019-01-05 DIAGNOSIS — M722 Plantar fascial fibromatosis: Secondary | ICD-10-CM | POA: Diagnosis not present

## 2019-01-05 NOTE — Progress Notes (Signed)
   Subjective:    Patient ID: Nicholas Mcintosh, male    DOB: 05-13-1966, 53 y.o.   MRN: [id number]  HPI    Review of Systems  All other systems reviewed and are negative.      Objective:   Physical Exam        Assessment & Plan:

## 2019-01-05 NOTE — Progress Notes (Signed)
Subjective:   Patient ID: Nicholas Mcintosh, male   DOB: 53 y.o.   MRN: [id number]   HPI Patient presents stating he has had trouble with both his heels for at least last month and has had a history of this.  Did have orthotics from years ago but they have worn out and they were helpful for him and states that the left one has now become more bothersome.  Patient does not smoke likes to be active   Review of Systems  All other systems reviewed and are negative.       Objective:  Physical Exam Vitals signs and nursing note reviewed.  Constitutional:      Appearance: He is well-developed.  Pulmonary:     Effort: Pulmonary effort is normal.  Musculoskeletal: Normal range of motion.  Skin:    General: Skin is warm.  Neurological:     Mental Status: He is alert.     Neurovascular status intact muscle strength found to be adequate range of motion within normal limits.  Patient is found to have moderate discomfort plantar heel left and right with inflammation fluid around the medial band and is noted to have good digital perfusion and is well oriented x3     Assessment:  Plantar fasciitis moderate in its intensity bilateral with flatfoot deformity noted bilateral     Plan:  H&P reviewed condition and x-rays.  Today I went ahead recommended new orthotics and referred to ped orthotist who casted him for functional orthotics.  Discussed plantar fasciitis and the fact if symptoms were to worsen will need to get more aggressive and placed him on diclofenac 75 mg twice daily  X-rays indicate that there is moderate depression of the arch with no overt spur formation or indications of stress fracture

## 2019-01-05 NOTE — Patient Instructions (Signed)

## 2019-01-14 DIAGNOSIS — E291 Testicular hypofunction: Secondary | ICD-10-CM | POA: Diagnosis not present

## 2019-01-14 DIAGNOSIS — H5213 Myopia, bilateral: Secondary | ICD-10-CM | POA: Diagnosis not present

## 2019-01-22 DIAGNOSIS — N200 Calculus of kidney: Secondary | ICD-10-CM | POA: Diagnosis not present

## 2019-01-22 DIAGNOSIS — N5201 Erectile dysfunction due to arterial insufficiency: Secondary | ICD-10-CM | POA: Diagnosis not present

## 2019-01-22 DIAGNOSIS — N400 Enlarged prostate without lower urinary tract symptoms: Secondary | ICD-10-CM | POA: Diagnosis not present

## 2019-01-22 DIAGNOSIS — E291 Testicular hypofunction: Secondary | ICD-10-CM | POA: Diagnosis not present

## 2019-01-26 ENCOUNTER — Other Ambulatory Visit: Payer: Self-pay

## 2019-01-26 ENCOUNTER — Ambulatory Visit: Payer: 59 | Admitting: Orthotics

## 2019-01-26 DIAGNOSIS — M722 Plantar fascial fibromatosis: Secondary | ICD-10-CM

## 2019-01-26 NOTE — Progress Notes (Signed)
Patient came in today to pick up custom made foot orthotics.  The goals were accomplished and the patient reported no dissatisfaction with said orthotics.  Patient was advised of breakin period and how to report any issues. 

## 2019-02-04 MED FILL — CLOMIPHENE CITRATE 50 MG TA: 50 | 30 days supply | Qty: 30 | Fill #0

## 2019-02-04 MED FILL — SYMBICORT 160-4.5 MCG INH: 160-4.5 | 30 days supply | Qty: 10 | Fill #3

## 2019-02-04 MED FILL — SILDENAFIL CITRATE 100 MG T: 100 | 90 days supply | Qty: 18 | Fill #0

## 2019-02-04 MED FILL — AMLODIPINE-VALSARTAN 5-320: 5-320 | 90 days supply | Qty: 90 | Fill #1

## 2019-02-04 MED FILL — DOXAZOSIN MESYLATE 8 MG TAB: 8 | 90 days supply | Qty: 90 | Fill #3

## 2019-02-04 MED FILL — MONTELUKAST SOD 10 MG TAB: 10 | 90 days supply | Qty: 90 | Fill #1

## 2019-02-06 MED FILL — FOLIC ACID 1 MG TABS: 1 | 90 days supply | Qty: 90 | Fill #3

## 2019-02-26 ENCOUNTER — Ambulatory Visit (HOSPITAL_COMMUNITY): Payer: 59 | Admitting: Psychiatry

## 2019-03-09 ENCOUNTER — Encounter (HOSPITAL_COMMUNITY): Payer: Self-pay | Admitting: Psychiatry

## 2019-03-09 ENCOUNTER — Other Ambulatory Visit: Payer: Self-pay

## 2019-03-09 ENCOUNTER — Ambulatory Visit (INDEPENDENT_AMBULATORY_CARE_PROVIDER_SITE_OTHER): Payer: 59 | Admitting: Psychiatry

## 2019-03-09 DIAGNOSIS — F411 Generalized anxiety disorder: Secondary | ICD-10-CM

## 2019-03-09 MED ORDER — DULOXETINE HCL 60 MG PO CPEP: 60.0000 mg | ORAL_CAPSULE | Freq: Every day | ORAL | 1 refills | Status: DC

## 2019-03-09 NOTE — Progress Notes (Signed)
Virtual Visit via Telephone Note  I connected with Nicholas Mcintosh on 03/09/19 at  1:40 PM EDT by telephone and verified that I am speaking with the correct person using two identifiers.   I discussed the limitations, risks, security and privacy concerns of performing an evaluation and management service by telephone and the availability of in person appointments. I also discussed with the patient that there may be a patient responsible charge related to this service. The patient expressed understanding and agreed to proceed.   History of Present Illness: Patient was evaluated through phone session.  He is stable on Cymbalta 60 mg.  He still have residual anxiety because of his lack of intimacy but now he had accepted the situation.  He had a good support from his wife.  He is sleeping better.  He denies any paranoia, hallucination or any crying spells.  Does not feel he need to see therapist at this time.  He has no tremors shakes or any EPS.  Denies any feeling of hopelessness or worthlessness.  He has upcoming appointment with Dr. Deforest Hoyles.  Recently his blood pressure medicine dose was increased but now he feels it is working better.  He also anxious about COVID but does not leave the house and this is important.  He is on disability because of his back.  He admitted weight gain due to lack of mobility but now he decided to walk every day and hoping to lose weight.  He denies drinking or using any illegal substances.  Denies any panic attack.  He wants to continue current dose of Cymbalta which he feel helping his anxiety.   Past Psychiatric History: Reviewed. No history of suicidal attempt or any psychiatric inpatient treatment. Took Cymbalta since seeing this Probation officer. Recommended to add Wellbutrin but he never filled the prescription.   Psychiatric Specialty Exam: Physical Exam  ROS  There were no vitals taken for this visit.There is no height or weight on file to calculate BMI.  General  Appearance: NA  Eye Contact:  NA  Speech:  Clear and Coherent and Slow  Volume:  Normal  Mood:  Anxious  Affect:  NA  Thought Process:  Goal Directed  Orientation:  Full (Time, Place, and Person)  Thought Content:  Rumination  Suicidal Thoughts:  No  Homicidal Thoughts:  No  Memory:  Immediate;   Good Recent;   Good Remote;   Good  Judgement:  Good  Insight:  Good  Psychomotor Activity:  NA  Concentration:  Concentration: Good and Attention Span: Good  Recall:  Good  Fund of Knowledge:  Good  Language:  Good  Akathisia:  No  Handed:  Right  AIMS (if indicated):     Assets:  Communication Skills Desire for Improvement Housing Resilience Social Support  ADL's:  Intact  Cognition:  WNL  Sleep:         Assessment and Plan: Generalized anxiety disorder.  Patient is a stable on Cymbalta 60 mg.  I reviewed his current medication.  He is taking blood pressure medicine with pain medication.  Discussed healthy lifestyle and watch his calorie intake.  Encouraged to keep appointment with Dr. Deforest Hoyles for physical and blood work.  Recommended to call us back if is any question or any concern.  Follow-up in 6 months.  Follow Up Instructions:    I discussed the assessment and treatment plan with the patient. The patient was provided an opportunity to ask questions and all were answered. The patient agreed with  the plan and demonstrated an understanding of the instructions.   The patient was advised to call back or seek an in-person evaluation if the symptoms worsen or if the condition fails to improve as anticipated.  I provided 20 minutes of non-face-to-face time during this encounter.   Kathlee Nations, MD

## 2019-03-18 MED FILL — LEVOTHYROXINE 50 MCG TABLET: 50 | 90 days supply | Qty: 90 | Fill #2

## 2019-03-18 MED FILL — ANASTROZOLE 1 MG TABLET: 1 | 90 days supply | Qty: 180 | Fill #1

## 2019-03-18 MED FILL — CLOMIPHENE CITRATE 50 MG TA: 50 | 30 days supply | Qty: 30 | Fill #1

## 2019-03-19 DIAGNOSIS — E291 Testicular hypofunction: Secondary | ICD-10-CM | POA: Diagnosis not present

## 2019-03-19 DIAGNOSIS — Z0000 Encounter for general adult medical examination without abnormal findings: Secondary | ICD-10-CM | POA: Diagnosis not present

## 2019-03-19 DIAGNOSIS — I10 Essential (primary) hypertension: Secondary | ICD-10-CM | POA: Diagnosis not present

## 2019-03-19 DIAGNOSIS — G8929 Other chronic pain: Secondary | ICD-10-CM | POA: Diagnosis not present

## 2019-03-19 DIAGNOSIS — F324 Major depressive disorder, single episode, in partial remission: Secondary | ICD-10-CM | POA: Diagnosis not present

## 2019-03-19 DIAGNOSIS — Z1389 Encounter for screening for other disorder: Secondary | ICD-10-CM | POA: Diagnosis not present

## 2019-03-19 DIAGNOSIS — E039 Hypothyroidism, unspecified: Secondary | ICD-10-CM | POA: Diagnosis not present

## 2019-04-02 MED FILL — SYMBICORT 160-4.5 MCG INH: 160-4.5 | 30 days supply | Qty: 10 | Fill #4

## 2019-04-02 MED FILL — DULOXETINE HCL 60 MG CPEP: 60 | 90 days supply | Qty: 90 | Fill #0

## 2019-05-13 ENCOUNTER — Ambulatory Visit: Payer: 59 | Admitting: Podiatry

## 2019-05-21 ENCOUNTER — Ambulatory Visit: Payer: 59 | Admitting: Podiatry

## 2019-05-21 ENCOUNTER — Other Ambulatory Visit: Payer: Self-pay

## 2019-05-21 ENCOUNTER — Encounter: Payer: Self-pay | Admitting: Podiatry

## 2019-05-21 DIAGNOSIS — M722 Plantar fascial fibromatosis: Secondary | ICD-10-CM | POA: Diagnosis not present

## 2019-05-22 MED FILL — ALBUTEROL SULFATE HFA 108 (: 108 (90 BAS | 17 days supply | Qty: 18 | Fill #0

## 2019-05-24 NOTE — Progress Notes (Signed)
Subjective:   Patient ID: Nicholas Mcintosh, male   DOB: 53 y.o.   MRN: [id number]   HPI Patient states continuing to have heel pain and is still very leery about having any kind of injection even though at one point he will probably need this.  States that it is worse after periods of sitting and when getting up in the morning   ROS      Objective:  Physical Exam  Neurovascular status intact with chronic inflammation of the plantar heel left that is localized with no other pathology noted and noted to have discomfort associated with this of a moderate nature     Assessment:  Chronic plantar fasciitis left heel     Plan:  Recommended night splint in order to stretch along with aggressive ice therapy and discussed injection in future which patient is can think about and decide on.  Explained that we will probably need something to try to help him with the acute nature of this condition

## 2019-06-02 MED FILL — SYMBICORT 160-4.5 MCG INH: 160-4.5 | 30 days supply | Qty: 10 | Fill #5

## 2019-06-02 MED FILL — CLOMIPHENE CITRATE 50 MG TA: 50 | 30 days supply | Qty: 30 | Fill #3

## 2019-06-02 MED FILL — DOXAZOSIN MESYLATE 8 MG TAB: 8 | 90 days supply | Qty: 90 | Fill #0

## 2019-06-02 MED FILL — FOLIC ACID 1 MG TABS: 1 | 90 days supply | Qty: 90 | Fill #0

## 2019-06-02 MED FILL — AMLODIPINE-VALSARTAN 5-320: 5-320 | 90 days supply | Qty: 90 | Fill #2

## 2019-06-02 MED FILL — LEVOTHYROXINE 50 MCG TABLET: 50 | 90 days supply | Qty: 90 | Fill #3

## 2019-06-02 MED FILL — MONTELUKAST SOD 10 MG TAB: 10 | 90 days supply | Qty: 90 | Fill #2

## 2019-06-29 DIAGNOSIS — Z131 Encounter for screening for diabetes mellitus: Secondary | ICD-10-CM | POA: Diagnosis not present

## 2019-06-29 DIAGNOSIS — Z13220 Encounter for screening for lipoid disorders: Secondary | ICD-10-CM | POA: Diagnosis not present

## 2019-06-29 DIAGNOSIS — E039 Hypothyroidism, unspecified: Secondary | ICD-10-CM | POA: Diagnosis not present

## 2019-06-29 DIAGNOSIS — Z0000 Encounter for general adult medical examination without abnormal findings: Secondary | ICD-10-CM | POA: Diagnosis not present

## 2019-06-29 DIAGNOSIS — Z125 Encounter for screening for malignant neoplasm of prostate: Secondary | ICD-10-CM | POA: Diagnosis not present

## 2019-07-01 DIAGNOSIS — E291 Testicular hypofunction: Secondary | ICD-10-CM | POA: Diagnosis not present

## 2019-07-01 DIAGNOSIS — E039 Hypothyroidism, unspecified: Secondary | ICD-10-CM | POA: Diagnosis not present

## 2019-07-01 DIAGNOSIS — Z23 Encounter for immunization: Secondary | ICD-10-CM | POA: Diagnosis not present

## 2019-07-01 DIAGNOSIS — F324 Major depressive disorder, single episode, in partial remission: Secondary | ICD-10-CM | POA: Diagnosis not present

## 2019-07-01 DIAGNOSIS — N200 Calculus of kidney: Secondary | ICD-10-CM | POA: Diagnosis not present

## 2019-07-01 DIAGNOSIS — Z1389 Encounter for screening for other disorder: Secondary | ICD-10-CM | POA: Diagnosis not present

## 2019-07-01 DIAGNOSIS — G894 Chronic pain syndrome: Secondary | ICD-10-CM | POA: Diagnosis not present

## 2019-07-01 DIAGNOSIS — Z0000 Encounter for general adult medical examination without abnormal findings: Secondary | ICD-10-CM | POA: Diagnosis not present

## 2019-07-01 DIAGNOSIS — I10 Essential (primary) hypertension: Secondary | ICD-10-CM | POA: Diagnosis not present

## 2019-07-01 DIAGNOSIS — J309 Allergic rhinitis, unspecified: Secondary | ICD-10-CM | POA: Diagnosis not present

## 2019-07-02 ENCOUNTER — Other Ambulatory Visit: Payer: Self-pay

## 2019-07-02 ENCOUNTER — Ambulatory Visit (INDEPENDENT_AMBULATORY_CARE_PROVIDER_SITE_OTHER): Payer: 59 | Admitting: Podiatry

## 2019-07-02 ENCOUNTER — Encounter: Payer: Self-pay | Admitting: Podiatry

## 2019-07-02 DIAGNOSIS — M722 Plantar fascial fibromatosis: Secondary | ICD-10-CM

## 2019-07-02 NOTE — Progress Notes (Signed)
Subjective:   Patient ID: Nicholas Mcintosh, male   DOB: 53 y.o.   MRN: [id number]   HPI Patient states I cannot quite get this heel pain under control and I know I need an injection   ROS      Objective:  Physical Exam  Neurovascular status intact with continued discomfort left plantar fascial insertional point tendon calcaneus     Assessment:  Continuation of plantar fasciitis left     Plan:  Sterile prep injected the left plantar fascia 3 mg Kenalog 5 mg Xylocaine and advised him on physical therapy anti-inflammatories and continued night splint usage

## 2019-07-07 MED FILL — CLOMIPHENE CITRATE 50 MG TA: 50 | 30 days supply | Qty: 30 | Fill #4

## 2019-07-09 DIAGNOSIS — E291 Testicular hypofunction: Secondary | ICD-10-CM | POA: Diagnosis not present

## 2019-07-09 DIAGNOSIS — N400 Enlarged prostate without lower urinary tract symptoms: Secondary | ICD-10-CM | POA: Diagnosis not present

## 2019-07-14 MED FILL — ANASTROZOLE 1 MG TABLET: 1 | 30 days supply | Qty: 30 | Fill #0

## 2019-07-15 MED FILL — SYMBICORT 160-4.5 MCG INH: 160-4.5 | 30 days supply | Qty: 10 | Fill #0

## 2019-07-16 DIAGNOSIS — E291 Testicular hypofunction: Secondary | ICD-10-CM | POA: Diagnosis not present

## 2019-07-16 DIAGNOSIS — J3089 Other allergic rhinitis: Secondary | ICD-10-CM | POA: Diagnosis not present

## 2019-07-16 DIAGNOSIS — J301 Allergic rhinitis due to pollen: Secondary | ICD-10-CM | POA: Diagnosis not present

## 2019-07-16 DIAGNOSIS — T781XXD Other adverse food reactions, not elsewhere classified, subsequent encounter: Secondary | ICD-10-CM | POA: Diagnosis not present

## 2019-07-16 DIAGNOSIS — N200 Calculus of kidney: Secondary | ICD-10-CM | POA: Diagnosis not present

## 2019-07-16 DIAGNOSIS — J4540 Moderate persistent asthma, uncomplicated: Secondary | ICD-10-CM | POA: Diagnosis not present

## 2019-07-16 MED FILL — SPIRIVA RESPIMAT 1.25 MCG I: 1.25 | 30 days supply | Qty: 4 | Fill #0

## 2019-07-16 MED FILL — TADALAFIL 20 MG TABS: 20 | 90 days supply | Qty: 18 | Fill #0

## 2019-08-18 ENCOUNTER — Encounter (HOSPITAL_COMMUNITY): Payer: Self-pay

## 2019-08-18 ENCOUNTER — Ambulatory Visit (HOSPITAL_COMMUNITY)
Admission: EM | Admit: 2019-08-18 | Discharge: 2019-08-18 | Disposition: A | Discharge status: Home / Self Care | Payer: 59 | Attending: Family Medicine | Admitting: Family Medicine

## 2019-08-18 DIAGNOSIS — Z1152 Encounter for screening for COVID-19: Secondary | ICD-10-CM | POA: Insufficient documentation

## 2019-08-18 DIAGNOSIS — J0110 Acute frontal sinusitis, unspecified: Secondary | ICD-10-CM | POA: Insufficient documentation

## 2019-08-18 DIAGNOSIS — J4531 Mild persistent asthma with (acute) exacerbation: Secondary | ICD-10-CM | POA: Diagnosis not present

## 2019-08-18 MED ORDER — PREDNISONE 20 MG PO TABS: 40.0000 mg | ORAL_TABLET | Freq: Every day | ORAL | 0 refills | Status: AC

## 2019-08-18 MED ORDER — IPRATROPIUM BROMIDE 0.03 % NA SOLN: 2.0000 | Freq: Two times a day (BID) | NASAL | 0 refills | Status: DC

## 2019-08-18 MED ORDER — AMOXICILLIN-POT CLAVULANATE 875-125 MG PO TABS: 1.0000 | ORAL_TABLET | Freq: Two times a day (BID) | ORAL | 0 refills | Status: DC

## 2019-08-18 NOTE — Discharge Instructions (Signed)
Your COVID 19 results will be available in 48-72 hours. Negative results are immediately resulted to Mychart. All positive results are communicated with a phone call from our office.  

## 2019-08-18 NOTE — ED Provider Notes (Signed)
[facility]    CSN: [id number] Arrival date & time: 08/18/19  0801      History   Chief Complaint Chief Complaint  Patient presents with  . Facial Pain  . Sinus Problem    HPI Nicholas Mcintosh is a 54 y.o. male.   HPI  Presents with a complaint of 3 weeks for nasal congestion, facial pressure, nonproductive cough, right ear pain, wheezing, and intermittent shortness of breath. Medical history significant for asthma. Endorses recurrent sinusitis. Asthma moderately controlled. Current endorses SOB with activity and occasional wheezing.  Chest feels tight occasionally. No known exposure to persons positive for COVID-19. Afebrile today.   High Risk Complication for COVID-19: Cardiovascular disease, Asthma, and Obesity  Remainder of Review of Systems negative except as noted in the HPI.     Past Medical History:  Diagnosis Date  . Chronic back pain    hx fall 2006 from 3 stories , vertebral fx  . Complication of anesthesia   . ED (erectile dysfunction)   . Exercise-induced asthma   . History of concussion    2006 from fall  . History of kidney stones   . Hypertension   . Hypothyroidism   . Nephrolithiasis    bilateral  . PONV (postoperative nausea and vomiting)   . Right ureteral stone     Patient Active Problem List   Diagnosis Date Noted  . Chronic back pain 01/03/2015  . Metabolic syndrome 08/12/2014  . Obesity 08/12/2014  . Muscle spasm of back 08/12/2014  . Vitamin D deficiency 08/12/2014  . High serum adrenocorticotropic hormone (ACTH) 07/08/2014  . Chronic pain syndrome 04/15/2014  . Annual physical exam 04/15/2014  . Muscle spasms of both lower extremities 04/12/2014  . Elbow pain, right 09/25/2013  . Gynecomastia, male 04/08/2013  . HTN (hypertension) 01/05/2013  . Testosterone insufficiency 01/05/2013  . Unspecified hypothyroidism 01/05/2013  . Erectile dysfunction 01/05/2013  . Back pain 01/05/2013  . Other malaise and fatigue  01/05/2013  . BPH (benign prostatic hyperplasia) 01/05/2013  . Generalized anxiety disorder 01/05/2013  . Scrotal injury 01/05/2013  . New-onset angina (HCC) 07/12/2011    Past Surgical History:  Procedure Laterality Date  . CARDIAC CATHETERIZATION  07-12-2011   dr Sharyn Lull   abnormal myoview/  normal coronary arteries and normal LVSF  . CARDIOVASCULAR STRESS TEST  06-15-2011   dr Sharyn Lull   mild reversible periinfarct anterior wall ischemia, normal wall motion , ef 49%  . COLONOSCOPY WITH PROPOFOL N/A 12/03/2016   Procedure: COLONOSCOPY WITH PROPOFOL;  Surgeon: Charolett Bumpers, MD;  Location: [facility];  Service: Endoscopy;  Laterality: N/A;  . CYSTOSCOPY WITH HOLMIUM LASER LITHOTRIPSY Left 02/15/2015   Procedure: CYSTOSCOPY WITH HOLMIUM LASER LITHOTRIPSY;  Surgeon: Jethro Bolus, MD;  Location: [facility];  Service: Urology;  Laterality: Left;  . CYSTOSCOPY WITH STENT PLACEMENT Left 02/15/2015   Procedure: CYSTOSCOPY WITH STENT PLACEMENT LEFT URETER;  Surgeon: Jethro Bolus, MD;  Location: [facility];  Service: Urology;  Laterality: Left;  . CYSTOSCOPY/RETROGRADE/URETEROSCOPY/STONE EXTRACTION WITH BASKET Left 02/15/2015   Procedure: CYSTOSCOPY/RETROGRADE/URETEROSCOPY/STONE EXTRACTION WITH BASKET;  Surgeon: Jethro Bolus, MD;  Location: [facility];  Service: Urology;  Laterality: Left;  . EXTRACORPOREAL SHOCK WAVE LITHOTRIPSY Right 02/03/2018   Procedure: RIGHT EXTRACORPOREAL SHOCK WAVE LITHOTRIPSY (ESWL);  Surgeon: Rene Paci, MD;  Location: [facility];  Service: Urology;  Laterality: Right;  Marland Kitchen VARICOCELECTOMY  2001 ?       Home Medications    Prior to Admission medications   Medication  Sig Start Date End Date Taking? Authorizing Provider  albuterol (PROVENTIL HFA;VENTOLIN HFA) 108 (90 BASE) MCG/ACT inhaler Inhale 2 puffs into the lungs every 4 (four) hours as needed for wheezing or shortness of breath. 05/01/14   Cathren Laine, MD  amLODipine-valsartan (EXFORGE) 5-320 MG  per tablet Take 1 tablet by mouth daily. 02/10/15   Henrietta Hoover, NP  anastrozole (ARIMIDEX) 1 MG tablet Take 2 mg by mouth daily.  08/09/16   [provider]  clomiPHENE (CLOMID) 50 MG tablet  02/04/19   [provider]  diclofenac (VOLTAREN) 75 MG EC tablet  01/01/19   [provider]  doxazosin (CARDURA) 8 MG tablet  02/04/19   [provider]  DULoxetine (CYMBALTA) 60 MG capsule Take 1 capsule (60 mg total) by mouth daily. 03/09/19   Arfeen, Phillips Grout, MD  folic acid (FOLVITE) 1 MG tablet TAKE 1 TABLET BY MOUTH DAILY. 08/12/15   Henrietta Hoover, NP  levothyroxine (SYNTHROID, LEVOTHROID) 50 MCG tablet TAKE 1 TABLET BY MOUTH ONCE DAILY 05/13/15   Henrietta Hoover, NP  montelukast (SINGULAIR) 10 MG tablet  02/04/19   [provider]  oxyCODONE-acetaminophen (PERCOCET/ROXICET) 5-325 MG tablet Take 1 tablet by mouth 3 (three) times daily.  08/03/15   [provider]  sildenafil (VIAGRA) 100 MG tablet Take 1 tablet (100 mg total) by mouth daily as needed for erectile dysfunction. 03/10/14   Altha Harm, MD  SYMBICORT 160-4.5 MCG/ACT inhaler  02/04/19   [provider]    Family History Family History  Problem Relation Age of Onset  . Hypertension Mother   . Cancer Maternal Aunt        Breast    Social History Social History   Tobacco Use  . Smoking status: Never Smoker  . Smokeless tobacco: Never Used  Substance Use Topics  . Alcohol use: No    Alcohol/week: 0.0 standard drinks  . Drug use: No     Allergies   Molds & smuts and Dust mite extract   Review of Systems Review of Systems Pertinent negatives listed in HPI  Physical Exam Triage Vital Signs ED Triage Vitals  Enc Vitals Group     BP 08/18/19 0818 (!) 158/89     Pulse Rate 08/18/19 0818 72     Resp 08/18/19 0818 18     Temp 08/18/19 0818 97.8 F (36.6 C)     Temp Source 08/18/19 0818 Oral     SpO2 08/18/19 0818 100 %     Weight --      Height --       Head Circumference --      Peak Flow --      Pain Score 08/18/19 0819 10     Pain Loc --      Pain Edu? --      Excl. in GC? --    No data found.  Updated Vital Signs BP (!) 158/89 (BP Location: Right Arm)   Pulse 72   Temp 97.8 F (36.6 C) (Oral)   Resp 18   SpO2 100%   Visual Acuity Right Eye Distance:   Left Eye Distance:   Bilateral Distance:    Right Eye Near:   Left Eye Near:    Bilateral Near:     Physical Exam Constitutional:      Appearance: Normal appearance. He is not ill-appearing.  HENT:     Head: Normocephalic.     Right Ear: Tympanic membrane is erythematous.  Nose: Mucosal edema and congestion present.     Right Turbinates: Enlarged.     Right Sinus: Maxillary sinus tenderness and frontal sinus tenderness present.  Cardiovascular:     Rate and Rhythm: Normal rate and regular rhythm.  Pulmonary:     Effort: Pulmonary effort is normal.     Breath sounds: Normal breath sounds.  Musculoskeletal:        General: Normal range of motion.     Cervical back: Normal range of motion. No tenderness.  Skin:    General: Skin is warm and dry.  Neurological:     General: No focal deficit present.     Mental Status: He is alert.  Psychiatric:        Mood and Affect: Mood normal.      UC Treatments / Results  Labs (all labs ordered are listed, but only abnormal results are displayed) Labs Reviewed - No data to display  EKG   Radiology No results found.  Procedures Procedures (including critical care time)  Medications Ordered in UC Medications - No data to display  Initial Impression / Assessment and Plan / UC Course  I have reviewed the triage vital signs and the nursing notes.  Pertinent labs & imaging results that were available during my care of the patient were reviewed by me and considered in my medical decision making (see chart for details).   Acute Sinusitis with mild asthma exacerbation. Treating as follows: Augmentin BID x  10 days for sinusitis. Continue Flonase.  Asthma symptom management, continue Albuterol and Symbicort. Adding a short burst of prednisone 40 mg x 5 days. COVID-19 testing pending. Red Flags discussed.  Final Clinical Impressions(s) / UC Diagnoses   Final diagnoses:  Acute frontal sinusitis, recurrence not specified  Mild persistent asthma with acute exacerbation  Encounter for screening for COVID-19     Discharge Instructions     Your COVID 19 results will be available in 48-72 hours. Negative results are immediately resulted to Mychart. All positive results are communicated with a phone call from our office.      ED Prescriptions    Medication Sig Dispense Auth. Provider   amoxicillin-clavulanate (AUGMENTIN) 875-125 MG tablet Take 1 tablet by mouth 2 (two) times daily. 20 tablet Scot Jun, FNP   predniSONE (DELTASONE) 20 MG tablet Take 2 tablets (40 mg total) by mouth daily with breakfast for 10 days. 20 tablet Scot Jun, FNP   ipratropium (ATROVENT) 0.03 % nasal spray Place 2 sprays into both nostrils 2 (two) times daily. 30 mL Scot Jun, FNP     PDMP not reviewed this encounter.   Scot Jun, [place] 08/18/19 [phone]

## 2019-08-18 NOTE — ED Triage Notes (Addendum)
Pt is here for facial and sinus pain. Symptoms being going on for 3 wks but patient has been trying OTC medication to but no relief.  Staff offered covid testing but pt states he do not think he has covid.

## 2019-08-20 LAB — NOVEL CORONAVIRUS, NAA (HOSP ORDER, SEND-OUT TO REF LAB; TAT 18-24 HRS): SARS-CoV-2, NAA: NOT DETECTED

## 2019-08-25 MED FILL — SPIRIVA RESPIMAT 1.25 MCG I: 1.25 | 30 days supply | Qty: 4 | Fill #1

## 2019-08-25 MED FILL — ANASTROZOLE 1 MG TABLET: 1 | 30 days supply | Qty: 30 | Fill #1

## 2019-08-25 MED FILL — ALBUTEROL SULFATE HFA 108 (: 108 (90 BAS | 17 days supply | Qty: 18 | Fill #0

## 2019-08-26 MED FILL — CLOMIPHENE CITRATE 50 MG TA: 50 | 30 days supply | Qty: 30 | Fill #5

## 2019-09-09 ENCOUNTER — Ambulatory Visit (HOSPITAL_COMMUNITY): Payer: 59 | Admitting: Psychiatry

## 2019-09-09 ENCOUNTER — Other Ambulatory Visit: Payer: Self-pay | Admitting: Family Medicine

## 2019-09-09 NOTE — Telephone Encounter (Signed)
Request for RF- sent for PCP review

## 2019-09-10 MED FILL — ALBUTEROL SULFATE HFA 108 (: 108 (90 BAS | 16 days supply | Qty: 18 | Fill #0

## 2019-09-11 MED FILL — SYMBICORT 160-4.5 MCG INH: 160-4.5 | 30 days supply | Qty: 10 | Fill #1

## 2019-09-16 ENCOUNTER — Ambulatory Visit: Payer: 59 | Admitting: Podiatry

## 2019-09-28 MED FILL — MONTELUKAST SOD 10 MG TAB: 10 | 30 days supply | Qty: 30 | Fill #0

## 2019-09-28 MED FILL — FOLIC ACID 1 MG TABS: 1 | 90 days supply | Qty: 90 | Fill #1

## 2019-09-28 MED FILL — DOXAZOSIN MESYLATE 8 MG TAB: 8 | 90 days supply | Qty: 90 | Fill #1

## 2019-09-29 MED FILL — ANASTROZOLE 1 MG TABLET: 1 | 90 days supply | Qty: 90 | Fill #2

## 2019-09-30 MED FILL — CLOMIPHENE CITRATE 50 MG TA: 50 | 30 days supply | Qty: 30 | Fill #6

## 2019-09-30 MED FILL — AMLODIPINE-VALSARTAN 5-320: 5-320 | 90 days supply | Qty: 90 | Fill #3

## 2019-09-30 MED FILL — LEVOTHYROXINE 50 MCG TABLET: 50 | 90 days supply | Qty: 90 | Fill #0

## 2019-09-30 MED FILL — SPIRIVA RESPIMAT 1.25 MCG I: 1.25 | 30 days supply | Qty: 4 | Fill #2

## 2019-10-15 DIAGNOSIS — E291 Testicular hypofunction: Secondary | ICD-10-CM | POA: Diagnosis not present

## 2019-10-29 ENCOUNTER — Ambulatory Visit: Payer: 59 | Admitting: Podiatry

## 2019-10-29 ENCOUNTER — Ambulatory Visit: Payer: 59 | Admitting: Orthotics

## 2019-10-29 ENCOUNTER — Other Ambulatory Visit: Payer: Self-pay

## 2019-10-29 ENCOUNTER — Encounter: Payer: Self-pay | Admitting: Podiatry

## 2019-10-29 VITALS — Temp 98.2°F

## 2019-10-29 DIAGNOSIS — N200 Calculus of kidney: Secondary | ICD-10-CM | POA: Insufficient documentation

## 2019-10-29 DIAGNOSIS — M722 Plantar fascial fibromatosis: Secondary | ICD-10-CM

## 2019-10-29 DIAGNOSIS — E8881 Metabolic syndrome: Secondary | ICD-10-CM

## 2019-10-29 NOTE — Progress Notes (Signed)
Subjective:   Patient ID: Nicholas Mcintosh, male   DOB: 54 y.o.   MRN: [id number]   HPI Patient presents with painful left heel stating it did really well for around 3-1/2 to 4 months and is reoccurred   ROS      Objective:  Physical Exam  Neurovascular status intact with patient's left heel being very tender in the medial band with inflammation with patient noted to have chronic flatfoot deformity     Assessment:  Acute plantar fasciitis reoccurrence with chronic flatfoot deformity     Plan:  H&P reviewed conditions did sterile prep and injected the fascia 3 mg Kenalog 5 mg Xylocaine and instructed on orthotics and scanned for customized orthotic devices

## 2019-10-29 NOTE — Patient Instructions (Signed)

## 2019-10-29 NOTE — Progress Notes (Signed)
Repeat 2020 foot orthotic order..same specs//// Dr. Charlsie Merles to put in charges.

## 2019-11-09 ENCOUNTER — Ambulatory Visit (INDEPENDENT_AMBULATORY_CARE_PROVIDER_SITE_OTHER): Payer: 59

## 2019-11-09 ENCOUNTER — Ambulatory Visit (HOSPITAL_COMMUNITY)
Admission: EM | Admit: 2019-11-09 | Discharge: 2019-11-09 | Disposition: A | Discharge status: Home / Self Care | Payer: 59 | Attending: Family Medicine | Admitting: Family Medicine

## 2019-11-09 ENCOUNTER — Other Ambulatory Visit: Payer: Self-pay

## 2019-11-09 ENCOUNTER — Encounter (HOSPITAL_COMMUNITY): Payer: Self-pay

## 2019-11-09 DIAGNOSIS — R05 Cough: Secondary | ICD-10-CM | POA: Diagnosis not present

## 2019-11-09 DIAGNOSIS — B370 Candidal stomatitis: Secondary | ICD-10-CM

## 2019-11-09 DIAGNOSIS — R059 Cough, unspecified: Secondary | ICD-10-CM

## 2019-11-09 MED ORDER — NYSTATIN 100000 UNIT/ML MT SUSP: 500000.0000 [IU] | Freq: Four times a day (QID) | OROMUCOSAL | 0 refills | Status: DC

## 2019-11-09 MED FILL — NYSTATIN 100,000 UNITS/ML S: 100000 | 3 days supply | Qty: 60 | Fill #0

## 2019-11-09 NOTE — Discharge Instructions (Addendum)
Take Claritin or zyrtec during the day along with the Singulair at night.  Mouth wash 4 x day for thrush.  Follow-up with your doctor as planned

## 2019-11-09 NOTE — ED Triage Notes (Signed)
Patient reports he did not wash his mouth in between his inhalers. Denies need for COVID testing. Reports cough and tightness in chest when swallowing. Reports his tongue feels swollen.

## 2019-11-09 NOTE — ED Provider Notes (Addendum)
[facility]    CSN: [id number] Arrival date & time: 11/09/19  0805      History   Chief Complaint Chief Complaint  Patient presents with  . Cough  . Thrush    HPI Nicholas Mcintosh is a 54 y.o. male.   Patient is 54 year old male past medical history of chronic back pain, exercise-induced asthma, kidney stones, hypertension, hypothyroidism, nephrolithiasis, obesity, allergies. He presents with continued chronic cough with thick mucus, nasal drip.  Symptoms seem to be worse at night.  Was seen here in January and treated for acute sinusitis with antibiotics, prednisone.  Did not see much relief from this.  He has been using his prescribed inhalers and admits to not rinsing his mouth out after using his steroid inhalers. Feeling the sensation of thrush with white coating to tongue.  No associated fever, chills, chest pain or shortness of breath.  He tested negative for Covid in January.  Denies any recent sick contacts or exposures.  Denies any fever, chest pain or shortness of breath.  ROS per HPI      Past Medical History:  Diagnosis Date  . Chronic back pain    hx fall 2006 from 3 stories , vertebral fx  . Complication of anesthesia   . ED (erectile dysfunction)   . Exercise-induced asthma   . History of concussion    2006 from fall  . History of kidney stones   . Hypertension   . Hypothyroidism   . Nephrolithiasis    bilateral  . PONV (postoperative nausea and vomiting)   . Right ureteral stone     Patient Active Problem List   Diagnosis Date Noted  . Kidney stone 10/29/2019  . Chronic back pain 01/03/2015  . Metabolic syndrome 08/12/2014  . Obesity 08/12/2014  . Muscle spasm of back 08/12/2014  . Vitamin D deficiency 08/12/2014  . High serum adrenocorticotropic hormone (ACTH) 07/08/2014  . Chronic pain syndrome 04/15/2014  . Annual physical exam 04/15/2014  . Muscle spasms of both lower extremities 04/12/2014  . Elbow pain, right 09/25/2013  .  Gynecomastia, male 04/08/2013  . HTN (hypertension) 01/05/2013  . Testosterone insufficiency 01/05/2013  . Unspecified hypothyroidism 01/05/2013  . Erectile dysfunction 01/05/2013  . Back pain 01/05/2013  . Other malaise and fatigue 01/05/2013  . BPH (benign prostatic hyperplasia) 01/05/2013  . Generalized anxiety disorder 01/05/2013  . Scrotal injury 01/05/2013  . New-onset angina (HCC) 07/12/2011    Past Surgical History:  Procedure Laterality Date  . CARDIAC CATHETERIZATION  07-12-2011   dr Sharyn Lull   abnormal myoview/  normal coronary arteries and normal LVSF  . CARDIOVASCULAR STRESS TEST  06-15-2011   dr Sharyn Lull   mild reversible periinfarct anterior wall ischemia, normal wall motion , ef 49%  . COLONOSCOPY WITH PROPOFOL N/A 12/03/2016   Procedure: COLONOSCOPY WITH PROPOFOL;  Surgeon: Charolett Bumpers, MD;  Location: [facility];  Service: Endoscopy;  Laterality: N/A;  . CYSTOSCOPY WITH HOLMIUM LASER LITHOTRIPSY Left 02/15/2015   Procedure: CYSTOSCOPY WITH HOLMIUM LASER LITHOTRIPSY;  Surgeon: Jethro Bolus, MD;  Location: [facility];  Service: Urology;  Laterality: Left;  . CYSTOSCOPY WITH STENT PLACEMENT Left 02/15/2015   Procedure: CYSTOSCOPY WITH STENT PLACEMENT LEFT URETER;  Surgeon: Jethro Bolus, MD;  Location: [facility];  Service: Urology;  Laterality: Left;  . CYSTOSCOPY/RETROGRADE/URETEROSCOPY/STONE EXTRACTION WITH BASKET Left 02/15/2015   Procedure: CYSTOSCOPY/RETROGRADE/URETEROSCOPY/STONE EXTRACTION WITH BASKET;  Surgeon: Jethro Bolus, MD;  Location: [facility];  Service: Urology;  Laterality: Left;  . EXTRACORPOREAL SHOCK  WAVE LITHOTRIPSY Right 02/03/2018   Procedure: RIGHT EXTRACORPOREAL SHOCK WAVE LITHOTRIPSY (ESWL);  Surgeon: Ceasar Mons, MD;  Location: [facility];  Service: Urology;  Laterality: Right;  Marland Kitchen VARICOCELECTOMY  2001 ?       Home Medications    Prior to Admission medications   Medication Sig Start Date End Date Taking? Authorizing Provider   albuterol (PROVENTIL HFA;VENTOLIN HFA) 108 (90 BASE) MCG/ACT inhaler Inhale 2 puffs into the lungs every 4 (four) hours as needed for wheezing or shortness of breath. 05/01/14   Lajean Saver, MD  amLODipine-valsartan (EXFORGE) 5-320 MG per tablet Take 1 tablet by mouth daily. 02/10/15   Micheline Chapman, NP  anastrozole (ARIMIDEX) 1 MG tablet Take 2 mg by mouth daily.  08/09/16   [provider]  clomiPHENE (CLOMID) 50 MG tablet  02/04/19   [provider]  diclofenac (VOLTAREN) 75 MG EC tablet  01/01/19   [provider]  doxazosin (CARDURA) 8 MG tablet  02/04/19   [provider]  DULoxetine (CYMBALTA) 60 MG capsule Take 1 capsule (60 mg total) by mouth daily. 03/09/19   Arfeen, Arlyce Harman, MD  folic acid (FOLVITE) 1 MG tablet TAKE 1 TABLET BY MOUTH DAILY. 08/12/15   Micheline Chapman, NP  ipratropium (ATROVENT) 0.03 % nasal spray Place 2 sprays into both nostrils 2 (two) times daily. 08/18/19   Scot Jun, FNP  levothyroxine (SYNTHROID, LEVOTHROID) 50 MCG tablet TAKE 1 TABLET BY MOUTH ONCE DAILY 05/13/15   Micheline Chapman, NP  montelukast (SINGULAIR) 10 MG tablet  02/04/19   [provider]  nystatin (MYCOSTATIN) 100000 UNIT/ML suspension Take 5 mLs (500,000 Units total) by mouth 4 (four) times daily. 11/09/19   Loura Halt A, NP  oxyCODONE-acetaminophen (PERCOCET/ROXICET) 5-325 MG tablet Take 1 tablet by mouth 3 (three) times daily. Takes for chronic back pain 08/03/15   [provider]  sildenafil (VIAGRA) 100 MG tablet Take 1 tablet (100 mg total) by mouth daily as needed for erectile dysfunction. 03/10/14   Leana Gamer, MD  SPIRIVA RESPIMAT 1.25 MCG/ACT AERS SMARTSIG:2 Puff(s) Via Inhaler Daily 09/30/19   [provider]  [facility] 160-4.5 MCG/ACT inhaler  02/04/19   [provider]    Family History Family History  Problem Relation Age of Onset  . Hypertension Mother   . Cancer Maternal Aunt        Breast     Social History Social History   Tobacco Use  . Smoking status: Never Smoker  . Smokeless tobacco: Never Used  Substance Use Topics  . Alcohol use: No    Alcohol/week: 0.0 standard drinks  . Drug use: No     Allergies   Molds & smuts and Dust mite extract   Review of Systems Review of Systems   Physical Exam Triage Vital Signs ED Triage Vitals  Enc Vitals Group     BP 11/09/19 0826 (!) 159/97     Pulse Rate 11/09/19 0826 70     Resp 11/09/19 0826 16     Temp 11/09/19 0826 98.1 F (36.7 C)     Temp Source 11/09/19 0826 Oral     SpO2 11/09/19 0826 95 %     Weight 11/09/19 0822 242 lb (109.8 kg)     Height --      Head Circumference --      Peak Flow --      Pain Score 11/09/19 0824 0     Pain Loc --  Pain Edu? --      Excl. in GC? --    No data found.  Updated Vital Signs BP (!) 159/97 (BP Location: Left Arm)   Pulse 70   Temp 98.1 F (36.7 C) (Oral)   Resp 16   Wt 242 lb (109.8 kg)   SpO2 95%   BMI 39.06 kg/m   Visual Acuity Right Eye Distance:   Left Eye Distance:   Bilateral Distance:    Right Eye Near:   Left Eye Near:    Bilateral Near:     Physical Exam Vitals and nursing note reviewed.  Constitutional:      General: He is not in acute distress.    Appearance: Normal appearance. He is not ill-appearing, toxic-appearing or diaphoretic.  HENT:     Head: Normocephalic and atraumatic.     Nose: Nose normal.     Mouth/Throat:     Comments: White coating to tongue.  No oral lesions. Eyes:     Conjunctiva/sclera: Conjunctivae normal.  Cardiovascular:     Rate and Rhythm: Normal rate and regular rhythm.  Pulmonary:     Effort: Pulmonary effort is normal.     Breath sounds: Normal breath sounds.  Musculoskeletal:        General: Normal range of motion.     Cervical back: Normal range of motion.  Skin:    General: Skin is warm and dry.  Neurological:     Mental Status: He is alert.  Psychiatric:        Mood and Affect: Mood  normal.      UC Treatments / Results  Labs (all labs ordered are listed, but only abnormal results are displayed) Labs Reviewed - No data to display  EKG   Radiology DG Chest 2 View  Result Date: 11/09/2019 CLINICAL DATA:  Prolonged cough since January, history hypertension EXAM: CHEST - 2 VIEW COMPARISON:  None. FINDINGS: Normal heart size, mediastinal contours, and pulmonary vascularity. Lungs clear. No pulmonary infiltrate or pneumothorax. Tiny pleural effusions blunt the posterior costophrenic angles Osseous structures unremarkable. IMPRESSION: Tiny pleural effusions blunt the posterior costophrenic angles. Otherwise clear lungs. Electronically Signed   By: Ulyses Southward M.D.   On: 11/09/2019 09:00    Procedures Procedures (including critical care time)  Medications Ordered in UC Medications - No data to display  Initial Impression / Assessment and Plan / UC Course  I have reviewed the triage vital signs and the nursing notes.  Pertinent labs & imaging results that were available during my care of the patient were reviewed by me and considered in my medical decision making (see chart for details).     Thrush- treating with nystatin mouthwash Recommended rinsing mouth after using his inhalers.   Cough-chest x-ray without any acute abnormalities and not concerning today. recommended in addition to his Singulair at night take daily Zyrtec during the day for better allergy relief. Keep using the inhalers as needed. Also spoke with patient about the possibility of acid reflux causing a chronic cough. Recommended watch diet and use over-the-counter Pepcid as needed Plan to see PCP on the 20th  Final Clinical Impressions(s) / UC Diagnoses   Final diagnoses:  Cough  Thrush     Discharge Instructions     Take Claritin or zyrtec during the day along with the Singulair at night.  Mouth wash 4 x day for thrush.  Follow-up with your doctor as planned    ED Prescriptions     Medication Sig Dispense  Auth. Provider   nystatin (MYCOSTATIN) 100000 UNIT/ML suspension  (Status: Discontinued) Take 5 mLs (500,000 Units total) by mouth 4 (four) times daily. 60 mL Juanisha Bautch A, NP   nystatin (MYCOSTATIN) 100000 UNIT/ML suspension Take 5 mLs (500,000 Units total) by mouth 4 (four) times daily. 60 mL Dylann Gallier A, NP     PDMP not reviewed this encounter.   Janace Aris, NP 11/09/19 0956    Janace Aris, NP 11/09/19 [phone]

## 2019-11-10 MED FILL — SYMBICORT 160-4.5 MCG INH: 160-4.5 | 30 days supply | Qty: 10 | Fill #2

## 2019-11-10 MED FILL — ALBUTEROL SULFATE HFA 108 (: 108 (90 BAS | 17 days supply | Qty: 18 | Fill #0

## 2019-11-10 MED FILL — CLOMIPHENE CITRATE 50 MG TA: 50 | 30 days supply | Qty: 30 | Fill #7

## 2019-11-10 MED FILL — MONTELUKAST SOD 10 MG TAB: 10 | 30 days supply | Qty: 30 | Fill #1

## 2019-11-18 DIAGNOSIS — M1990 Unspecified osteoarthritis, unspecified site: Secondary | ICD-10-CM | POA: Diagnosis not present

## 2019-11-18 DIAGNOSIS — J4540 Moderate persistent asthma, uncomplicated: Secondary | ICD-10-CM | POA: Diagnosis not present

## 2019-11-18 DIAGNOSIS — I10 Essential (primary) hypertension: Secondary | ICD-10-CM | POA: Diagnosis not present

## 2019-11-18 DIAGNOSIS — F324 Major depressive disorder, single episode, in partial remission: Secondary | ICD-10-CM | POA: Diagnosis not present

## 2019-11-18 DIAGNOSIS — E291 Testicular hypofunction: Secondary | ICD-10-CM | POA: Diagnosis not present

## 2019-11-18 DIAGNOSIS — G894 Chronic pain syndrome: Secondary | ICD-10-CM | POA: Diagnosis not present

## 2019-11-18 DIAGNOSIS — Z6838 Body mass index (BMI) 38.0-38.9, adult: Secondary | ICD-10-CM | POA: Diagnosis not present

## 2019-11-18 DIAGNOSIS — E039 Hypothyroidism, unspecified: Secondary | ICD-10-CM | POA: Diagnosis not present

## 2019-11-19 ENCOUNTER — Other Ambulatory Visit: Payer: 59 | Admitting: Orthotics

## 2019-12-08 DIAGNOSIS — R0683 Snoring: Secondary | ICD-10-CM | POA: Diagnosis not present

## 2019-12-08 DIAGNOSIS — G479 Sleep disorder, unspecified: Secondary | ICD-10-CM | POA: Diagnosis not present

## 2019-12-30 DIAGNOSIS — J3089 Other allergic rhinitis: Secondary | ICD-10-CM | POA: Diagnosis not present

## 2019-12-30 DIAGNOSIS — J019 Acute sinusitis, unspecified: Secondary | ICD-10-CM | POA: Diagnosis not present

## 2019-12-30 DIAGNOSIS — J4540 Moderate persistent asthma, uncomplicated: Secondary | ICD-10-CM | POA: Diagnosis not present

## 2019-12-30 DIAGNOSIS — J301 Allergic rhinitis due to pollen: Secondary | ICD-10-CM | POA: Diagnosis not present

## 2020-01-04 ENCOUNTER — Other Ambulatory Visit (HOSPITAL_BASED_OUTPATIENT_CLINIC_OR_DEPARTMENT_OTHER): Payer: Self-pay | Admitting: Allergy and Immunology

## 2020-01-07 DIAGNOSIS — J301 Allergic rhinitis due to pollen: Secondary | ICD-10-CM | POA: Diagnosis not present

## 2020-01-08 DIAGNOSIS — J3089 Other allergic rhinitis: Secondary | ICD-10-CM | POA: Diagnosis not present

## 2020-01-11 ENCOUNTER — Other Ambulatory Visit (HOSPITAL_BASED_OUTPATIENT_CLINIC_OR_DEPARTMENT_OTHER): Payer: Self-pay | Admitting: Internal Medicine

## 2020-01-11 MED FILL — AMLODIPINE-VALSARTAN 5-320: 5-320 | 90 days supply | Qty: 90 | Fill #0

## 2020-01-14 DIAGNOSIS — E291 Testicular hypofunction: Secondary | ICD-10-CM | POA: Diagnosis not present

## 2020-01-14 DIAGNOSIS — N400 Enlarged prostate without lower urinary tract symptoms: Secondary | ICD-10-CM | POA: Diagnosis not present

## 2020-01-18 DIAGNOSIS — R0683 Snoring: Secondary | ICD-10-CM | POA: Diagnosis not present

## 2020-01-18 DIAGNOSIS — G4719 Other hypersomnia: Secondary | ICD-10-CM | POA: Diagnosis not present

## 2020-01-21 ENCOUNTER — Other Ambulatory Visit (HOSPITAL_BASED_OUTPATIENT_CLINIC_OR_DEPARTMENT_OTHER): Payer: Self-pay | Admitting: Urology

## 2020-01-21 DIAGNOSIS — N5201 Erectile dysfunction due to arterial insufficiency: Secondary | ICD-10-CM | POA: Diagnosis not present

## 2020-01-21 DIAGNOSIS — E291 Testicular hypofunction: Secondary | ICD-10-CM | POA: Diagnosis not present

## 2020-01-21 DIAGNOSIS — N200 Calculus of kidney: Secondary | ICD-10-CM | POA: Diagnosis not present

## 2020-01-21 MED FILL — TADALAFIL 20 MG TABS: 20 | 90 days supply | Qty: 18 | Fill #0

## 2020-01-25 ENCOUNTER — Other Ambulatory Visit (HOSPITAL_BASED_OUTPATIENT_CLINIC_OR_DEPARTMENT_OTHER): Payer: Self-pay

## 2020-01-25 DIAGNOSIS — R0681 Apnea, not elsewhere classified: Secondary | ICD-10-CM

## 2020-01-25 DIAGNOSIS — R0683 Snoring: Secondary | ICD-10-CM

## 2020-01-25 DIAGNOSIS — G4710 Hypersomnia, unspecified: Secondary | ICD-10-CM

## 2020-01-27 DIAGNOSIS — E785 Hyperlipidemia, unspecified: Secondary | ICD-10-CM | POA: Diagnosis not present

## 2020-01-27 DIAGNOSIS — I10 Essential (primary) hypertension: Secondary | ICD-10-CM | POA: Diagnosis not present

## 2020-01-27 DIAGNOSIS — R0789 Other chest pain: Secondary | ICD-10-CM | POA: Diagnosis not present

## 2020-01-29 MED FILL — ALBUTEROL SULFATE HFA 108 (: 108 (90 BAS | 25 days supply | Qty: 18 | Fill #0

## 2020-02-16 ENCOUNTER — Other Ambulatory Visit (HOSPITAL_BASED_OUTPATIENT_CLINIC_OR_DEPARTMENT_OTHER): Payer: Self-pay | Admitting: Urology

## 2020-02-16 MED FILL — CLOMIPHENE CITRATE 50 MG TA: 50 | 30 days supply | Qty: 30 | Fill #0

## 2020-02-26 ENCOUNTER — Ambulatory Visit (HOSPITAL_BASED_OUTPATIENT_CLINIC_OR_DEPARTMENT_OTHER): Payer: 59 | Attending: Internal Medicine | Admitting: Internal Medicine

## 2020-02-26 ENCOUNTER — Other Ambulatory Visit: Payer: Self-pay

## 2020-02-26 ENCOUNTER — Encounter (HOSPITAL_BASED_OUTPATIENT_CLINIC_OR_DEPARTMENT_OTHER): Payer: 59 | Admitting: Internal Medicine

## 2020-02-26 DIAGNOSIS — R0681 Apnea, not elsewhere classified: Secondary | ICD-10-CM

## 2020-02-26 DIAGNOSIS — G4710 Hypersomnia, unspecified: Secondary | ICD-10-CM

## 2020-02-26 DIAGNOSIS — G4733 Obstructive sleep apnea (adult) (pediatric): Secondary | ICD-10-CM | POA: Insufficient documentation

## 2020-02-26 DIAGNOSIS — R0683 Snoring: Secondary | ICD-10-CM

## 2020-02-29 DIAGNOSIS — J3089 Other allergic rhinitis: Secondary | ICD-10-CM | POA: Diagnosis not present

## 2020-02-29 DIAGNOSIS — J301 Allergic rhinitis due to pollen: Secondary | ICD-10-CM | POA: Diagnosis not present

## 2020-03-02 DIAGNOSIS — J3089 Other allergic rhinitis: Secondary | ICD-10-CM | POA: Diagnosis not present

## 2020-03-02 DIAGNOSIS — J301 Allergic rhinitis due to pollen: Secondary | ICD-10-CM | POA: Diagnosis not present

## 2020-03-02 MED FILL — MONTELUKAST SOD 10 MG TAB: 10 | 30 days supply | Qty: 30 | Fill #1

## 2020-03-03 ENCOUNTER — Ambulatory Visit (INDEPENDENT_AMBULATORY_CARE_PROVIDER_SITE_OTHER): Payer: 59

## 2020-03-03 ENCOUNTER — Ambulatory Visit (HOSPITAL_COMMUNITY)
Admission: EM | Admit: 2020-03-03 | Discharge: 2020-03-03 | Disposition: A | Discharge status: Home / Self Care | Payer: 59 | Attending: Family Medicine | Admitting: Family Medicine

## 2020-03-03 ENCOUNTER — Other Ambulatory Visit: Payer: Self-pay

## 2020-03-03 ENCOUNTER — Encounter (HOSPITAL_COMMUNITY): Payer: Self-pay | Admitting: Emergency Medicine

## 2020-03-03 DIAGNOSIS — E669 Obesity, unspecified: Secondary | ICD-10-CM | POA: Insufficient documentation

## 2020-03-03 DIAGNOSIS — Z79899 Other long term (current) drug therapy: Secondary | ICD-10-CM | POA: Diagnosis not present

## 2020-03-03 DIAGNOSIS — R053 Chronic cough: Secondary | ICD-10-CM

## 2020-03-03 DIAGNOSIS — Z7901 Long term (current) use of anticoagulants: Secondary | ICD-10-CM | POA: Diagnosis not present

## 2020-03-03 DIAGNOSIS — R05 Cough: Secondary | ICD-10-CM

## 2020-03-03 DIAGNOSIS — R0602 Shortness of breath: Secondary | ICD-10-CM

## 2020-03-03 DIAGNOSIS — E039 Hypothyroidism, unspecified: Secondary | ICD-10-CM | POA: Insufficient documentation

## 2020-03-03 DIAGNOSIS — M549 Dorsalgia, unspecified: Secondary | ICD-10-CM | POA: Insufficient documentation

## 2020-03-03 DIAGNOSIS — R0982 Postnasal drip: Secondary | ICD-10-CM | POA: Insufficient documentation

## 2020-03-03 DIAGNOSIS — M62830 Muscle spasm of back: Secondary | ICD-10-CM | POA: Insufficient documentation

## 2020-03-03 DIAGNOSIS — G894 Chronic pain syndrome: Secondary | ICD-10-CM | POA: Diagnosis not present

## 2020-03-03 DIAGNOSIS — F411 Generalized anxiety disorder: Secondary | ICD-10-CM | POA: Diagnosis not present

## 2020-03-03 DIAGNOSIS — J90 Pleural effusion, not elsewhere classified: Secondary | ICD-10-CM | POA: Diagnosis not present

## 2020-03-03 DIAGNOSIS — Z20822 Contact with and (suspected) exposure to covid-19: Secondary | ICD-10-CM | POA: Insufficient documentation

## 2020-03-03 DIAGNOSIS — I10 Essential (primary) hypertension: Secondary | ICD-10-CM | POA: Insufficient documentation

## 2020-03-03 DIAGNOSIS — R0789 Other chest pain: Secondary | ICD-10-CM | POA: Diagnosis not present

## 2020-03-03 LAB — SARS CORONAVIRUS 2 (TAT 6-24 HRS): SARS Coronavirus 2: NEGATIVE

## 2020-03-03 MED ORDER — PREDNISONE 10 MG PO TABS
40.0000 mg | ORAL_TABLET | Freq: Every day | ORAL | 0 refills | Status: AC
Start: 2020-03-03 — End: 2020-03-08

## 2020-03-03 MED ORDER — OMEPRAZOLE 20 MG PO CPDR
20.0000 mg | DELAYED_RELEASE_CAPSULE | Freq: Every day | ORAL | 0 refills | Status: DC
Start: 2020-03-03 — End: 2020-04-11

## 2020-03-03 MED ORDER — BENZONATATE 100 MG PO CAPS: 100.0000 mg | ORAL_CAPSULE | Freq: Three times a day (TID) | ORAL | 0 refills | Status: DC | PRN

## 2020-03-03 MED ORDER — IPRATROPIUM-ALBUTEROL 0.5-2.5 (3) MG/3ML IN SOLN: 3.0000 mL | RESPIRATORY_TRACT | 0 refills | Status: DC | PRN

## 2020-03-03 MED FILL — IPRAT-ALBUT 0.5-3(2.5) MG/3: 0.5-2.5 (3) | 5 days supply | Qty: 90 | Fill #0

## 2020-03-03 MED FILL — predniSONE 10 MG TABS: 10 | 5 days supply | Qty: 20 | Fill #0

## 2020-03-03 MED FILL — OMEPRAZOLE 20 MG CAP: 20 | 14 days supply | Qty: 14 | Fill #0

## 2020-03-03 MED FILL — BENZONATATE 100 MG CAPS: 100 | 10 days supply | Qty: 60 | Fill #0

## 2020-03-03 NOTE — Discharge Instructions (Addendum)
Take the Tessalon as prescribed Take prednisone for 5 days Use the nebulizer treatment every 4 hours for the next 24 hours and then as needed Continue use of albuterol as needed Take Prilosec daily  Continue your other treatments as prescribed  Schedule follow-up with your primary care early next week if possible  If you develop worsening shortness of breath, severe chest pain, or overall worsening symptoms please report to the emergency department

## 2020-03-03 NOTE — ED Triage Notes (Signed)
Pt presents with itchy throat, SOB, cough with some production. States this has been going on for 6 mo. States has coughed so much that his abdominal muscles are very sore. Pain is worse with coughing. C/o N,V with coughing. Denies fever and diarrhea.  Has hx of Asthma. Using inhaler multiple times a day but does not feel relief after an hour.Pt is requesting Chest X-ray

## 2020-03-03 NOTE — ED Provider Notes (Signed)
[facility]    CSN: [id number] Arrival date & time: 03/03/20  0845      History   Chief Complaint No chief complaint on file.   HPI Nicholas Mcintosh is a 54 y.o. male.   Patient reports for evaluation of chronic cough.  Reports symptoms of been present for around 6 months.  He was seen in this clinic in April for similar, seen by his allergist as well.  He endorses a history of asthma.  Throughout the duration has noticed persistent scratchy throat and drainage which he believes is driving the cough.  Reports some chest tightness that usually resolves from albuterol treatments however recently albuterol is not been as effective, this is been going on about a month.  He reports some left upper abdominal and left lower chest discomfort from the cough.  Tender to palpation per patient.  He denies other chest pain.  He endorses feeling winded and short of breath at times and with ambulation, reports this has been going on for quite some time, and is not acutely worse.  He reports using all of his daily inhalers, taking his montelukast, taking Zyrtec and using Flonase.  Denies history of having issues with reflux.  Denies fevers and chills.  Cough with clear sputum.  Patient states that things are mostly stable, however he has not been to get back in with his asthma/allergist as they state he has a cough and he cannot come in.  He has not followed up with his primary care about this concern.  He saw his cardiologist last month.     Past Medical History:  Diagnosis Date  . Chronic back pain    hx fall 2006 from 3 stories , vertebral fx  . Complication of anesthesia   . ED (erectile dysfunction)   . Exercise-induced asthma   . History of concussion    2006 from fall  . History of kidney stones   . Hypertension   . Hypothyroidism   . Nephrolithiasis    bilateral  . PONV (postoperative nausea and vomiting)   . Right ureteral stone     Patient Active Problem List    Diagnosis Date Noted  . Kidney stone 10/29/2019  . Chronic back pain 01/03/2015  . Metabolic syndrome 08/12/2014  . Obesity 08/12/2014  . Muscle spasm of back 08/12/2014  . Vitamin D deficiency 08/12/2014  . High serum adrenocorticotropic hormone (ACTH) 07/08/2014  . Chronic pain syndrome 04/15/2014  . Annual physical exam 04/15/2014  . Muscle spasms of both lower extremities 04/12/2014  . Elbow pain, right 09/25/2013  . Gynecomastia, male 04/08/2013  . HTN (hypertension) 01/05/2013  . Testosterone insufficiency 01/05/2013  . Unspecified hypothyroidism 01/05/2013  . Erectile dysfunction 01/05/2013  . Back pain 01/05/2013  . Other malaise and fatigue 01/05/2013  . BPH (benign prostatic hyperplasia) 01/05/2013  . Generalized anxiety disorder 01/05/2013  . Scrotal injury 01/05/2013  . New-onset angina (HCC) 07/12/2011    Past Surgical History:  Procedure Laterality Date  . CARDIAC CATHETERIZATION  07-12-2011   dr Sharyn Lull   abnormal myoview/  normal coronary arteries and normal LVSF  . CARDIOVASCULAR STRESS TEST  06-15-2011   dr Sharyn Lull   mild reversible periinfarct anterior wall ischemia, normal wall motion , ef 49%  . COLONOSCOPY WITH PROPOFOL N/A 12/03/2016   Procedure: COLONOSCOPY WITH PROPOFOL;  Surgeon: Charolett Bumpers, MD;  Location: [facility];  Service: Endoscopy;  Laterality: N/A;  . CYSTOSCOPY WITH HOLMIUM LASER LITHOTRIPSY Left 02/15/2015  Procedure: CYSTOSCOPY WITH HOLMIUM LASER LITHOTRIPSY;  Surgeon: Sigmund Tannenbaum, MD;  Location: [facility];  Service: Urology;  Laterality: Left;  . CYSTOSCOPY WITH STENT PLACEMENT Left 02/15/2015   Procedure: CYSTOSCOPY WITH STENT PLACEMENT LEFT URETER;  Surgeon: JetJethro Bolushro BolusSigmund Tannenbaum, MD;  Location: [facility];  Service: Urology;  Laterality: Left;  . CYSTOSCOPY/RETROGRADE/URETEROSCOPY/STONE EXTRACTION WITH BASKET Left 02/15/2015   Procedure: CYSTOSCOPY/RETROGRADE/URETEROSCOPY/STONE EXTRACTION WITH BASKET;  Surgeon: Jethro BolusSigmund Tannenbaum, MD;   Location: [facility];  Service: Urology;  Laterality: Left;  . EXTRACORPOREAL SHOCK WAVE LITHOTRIPSY Right 02/03/2018   Procedure: RIGHT EXTRACORPOREAL SHOCK WAVE LITHOTRIPSY (ESWL);  Surgeon: Rene PaciWinter, Christopher Aaron, MD;  Location: [facility];  Service: Urology;  Laterality: Right;  Marland Kitchen. VARICOCELECTOMY  2001 ?       Home Medications    Prior to Admission medications   Medication Sig Start Date End Date Taking? Authorizing Provider  albuterol (PROVENTIL HFA;VENTOLIN HFA) 108 (90 BASE) MCG/ACT inhaler Inhale 2 puffs into the lungs every 4 (four) hours as needed for wheezing or shortness of breath. 05/01/14  Yes Cathren LaineSteinl, Kevin, MD  amLODipine-valsartan (EXFORGE) 5-320 MG per tablet Take 1 tablet by mouth daily. 02/10/15  Yes Henrietta HooverBernhardt, Linda C, NP  levothyroxine (SYNTHROID, LEVOTHROID) 50 MCG tablet TAKE 1 TABLET BY MOUTH ONCE DAILY 05/13/15  Yes Henrietta HooverBernhardt, Linda C, NP  montelukast (SINGULAIR) 10 MG tablet  02/04/19  Yes [provider]  oxyCODONE-acetaminophen (PERCOCET/ROXICET) 5-325 MG tablet Take 1 tablet by mouth 3 (three) times daily. Takes for chronic back pain 08/03/15  Yes [provider]  SPIRIVA RESPIMAT 1.25 MCG/ACT AERS SMARTSIG:2 Puff(s) Via Inhaler Daily 09/30/19  Yes [provider]  SYMBICORT 160-4.5 MCG/ACT inhaler  02/04/19  Yes [provider]  anastrozole (ARIMIDEX) 1 MG tablet Take 2 mg by mouth daily.  08/09/16   [provider]  benzonatate (TESSALON) 100 MG capsule Take 1-2 capsules (100-200 mg total) by mouth 3 (three) times daily as needed for cough. 03/03/20   Schuyler Behan, Veryl SpeakJacob E, PA-C  clomiPHENE (CLOMID) 50 MG tablet  02/04/19   [provider]  diclofenac (VOLTAREN) 75 MG EC tablet  01/01/19   [provider]  doxazosin (CARDURA) 8 MG tablet  02/04/19   [provider]  DULoxetine (CYMBALTA) 60 MG capsule Take 1 capsule (60 mg total) by mouth daily. 03/09/19   Arfeen, Phillips GroutSyed T, MD  folic acid (FOLVITE) 1 MG tablet TAKE 1 TABLET  BY MOUTH DAILY. 08/12/15   Henrietta HooverBernhardt, Linda C, NP  ipratropium (ATROVENT) 0.03 % nasal spray Place 2 sprays into both nostrils 2 (two) times daily. 08/18/19   Bing NeighborsHarris, Kimberly S, FNP  ipratropium-albuterol (DUONEB) 0.5-2.5 (3) MG/3ML SOLN Take 3 mLs by nebulization every 4 (four) hours as needed. 03/03/20   Sheriann Newmann, Veryl SpeakJacob E, PA-C  nystatin (MYCOSTATIN) 100000 UNIT/ML suspension Take 5 mLs (500,000 Units total) by mouth 4 (four) times daily. 11/09/19   Dahlia ByesBast, Traci A, NP  omeprazole (PRILOSEC) 20 MG capsule Take 1 capsule (20 mg total) by mouth daily for 14 days. 03/03/20 03/17/20  Tauno Falotico, Veryl SpeakJacob E, PA-C  predniSONE (DELTASONE) 10 MG tablet Take 4 tablets (40 mg total) by mouth daily for 5 days. 03/03/20 03/08/20  Cresencio Reesor, Veryl SpeakJacob E, PA-C  sildenafil (VIAGRA) 100 MG tablet Take 1 tablet (100 mg total) by mouth daily as needed for erectile dysfunction. 03/10/14   Altha HarmMatthews, Michelle A, MD    Family History Family History  Problem Relation Age of Onset  . Hypertension Mother   . Cancer Maternal Aunt  Breast    Social History Social History   Tobacco Use  . Smoking status: Never Smoker  . Smokeless tobacco: Never Used  Vaping Use  . Vaping Use: Never used  Substance Use Topics  . Alcohol use: No    Alcohol/week: 0.0 standard drinks  . Drug use: No     Allergies   Molds & smuts and Dust mite extract   Review of Systems Review of Systems   Physical Exam Triage Vital Signs ED Triage Vitals  Enc Vitals Group     BP 03/03/20 0920 (!) 173/98     Pulse Rate 03/03/20 0920 68     Resp 03/03/20 0920 20     Temp 03/03/20 0920 98.1 F (36.7 C)     Temp Source 03/03/20 0920 Oral     SpO2 03/03/20 0920 99 %     Weight --      Height --      Head Circumference --      Peak Flow --      Pain Score 03/03/20 0916 10     Pain Loc --      Pain Edu? --      Excl. in GC? --    No data found.  Updated Vital Signs BP (!) 173/98 (BP Location: Left Arm)   Pulse 68   Temp 98.1 F (36.7 C) (Oral)    Resp 20   SpO2 99%   Visual Acuity Right Eye Distance:   Left Eye Distance:   Bilateral Distance:    Right Eye Near:   Left Eye Near:    Bilateral Near:     Physical Exam Vitals and nursing note reviewed.  Constitutional:      General: He is not in acute distress.    Appearance: He is well-developed. He is not ill-appearing.  HENT:     Head: Normocephalic and atraumatic.     Nose: Congestion and rhinorrhea present.     Mouth/Throat:     Mouth: Mucous membranes are moist.     Comments: Some postnasal drip in the oropharynx Eyes:     Extraocular Movements: Extraocular movements intact.     Conjunctiva/sclera: Conjunctivae normal.     Pupils: Pupils are equal, round, and reactive to light.  Cardiovascular:     Rate and Rhythm: Normal rate and regular rhythm.     Heart sounds: No murmur heard.   Pulmonary:     Effort: Pulmonary effort is normal. No respiratory distress.     Breath sounds: Normal breath sounds. No wheezing, rhonchi or rales.     Comments: Speaking in full sentences rating it 99% on room air.  No accessory muscle use. Chest:     Chest wall: Tenderness (Left lower chest with reproducible chest tenderness.) present.  Abdominal:     Palpations: Abdomen is soft.     Tenderness: There is no abdominal tenderness.  Musculoskeletal:     Cervical back: Neck supple. No tenderness.     Right lower leg: No edema.     Left lower leg: No edema.  Lymphadenopathy:     Cervical: No cervical adenopathy.  Skin:    General: Skin is warm and dry.  Neurological:     General: No focal deficit present.     Mental Status: He is alert and oriented to person, place, and time.      UC Treatments / Results  Labs (all labs ordered are listed, but only abnormal results are displayed) Labs Reviewed  SARS  CORONAVIRUS 2 (TAT 6-24 HRS)    EKG Sinus bradycardia T wave inversions in V4-V6, similar when compared to 02/01/2015 EKG.  No significant changes.  Seems baseline EKG for  this patient.  Radiology DG Chest 2 View  Result Date: 03/03/2020 CLINICAL DATA:  Persistent cough and shortness of breath EXAM: CHEST - 2 VIEW COMPARISON:  11/09/2019 FINDINGS: Cardiac shadow is within normal limits. Lungs are well aerated bilaterally. No focal infiltrate is noted. Minimal chronic blunting of the costophrenic angles posteriorly is seen no bony abnormality is noted. IMPRESSION: Small stable posterior effusions. No new focal abnormality is noted. Electronically Signed   By: Alcide Clever M.D.   On: 03/03/2020 10:10    Procedures Procedures (including critical care time)  Medications Ordered in UC Medications - No data to display  Initial Impression / Assessment and Plan / UC Course  I have reviewed the triage vital signs and the nursing notes.  Pertinent labs & imaging results that were available during my care of the patient were reviewed by me and considered in my medical decision making (see chart for details).     #Chronic cough #Shortness of breath #Postnasal drip Patient is a 54 year old gentleman presenting with a 67-month history of cough and shortness of breath.  History of asthma, no wheezing on exam however.  Does appear to have chronic postnasal drip that is causing the throat sensation and some aspect of the cough.  Chest x-ray stable from 11/17/2019.  Patient has pulmonology appointment in September.  EKG reassuring.  Given lack of acute changes, will treat symptomatically.  Doubt any bacterial infection at this time.  Will recommend patient a PPI.  Will do short course of prednisone and recommend return for ipratropium-albuterol DuoNeb treatments as he has had minimal relief from his albuterol.  Discussed the need to follow-up with his primary care.  Emergency department precautions discussed.  Patient verbalized understanding. Final Clinical Impressions(s) / UC Diagnoses   Final diagnoses:  Chronic cough  Shortness of breath     Discharge Instructions       Take the Tessalon as prescribed Take prednisone for 5 days Use the nebulizer treatment every 4 hours for the next 24 hours and then as needed Continue use of albuterol as needed Take Prilosec daily  Continue your other treatments as prescribed  Schedule follow-up with your primary care early next week if possible  If you develop worsening shortness of breath, severe chest pain, or overall worsening symptoms please report to the emergency department      ED Prescriptions    Medication Sig Dispense Auth. Provider   ipratropium-albuterol (DUONEB) 0.5-2.5 (3) MG/3ML SOLN Take 3 mLs by nebulization every 4 (four) hours as needed. 75 mL Aviyah Swetz, Veryl Speak, PA-C   predniSONE (DELTASONE) 10 MG tablet Take 4 tablets (40 mg total) by mouth daily for 5 days. 20 tablet Callin Ashe, Veryl Speak, PA-C   benzonatate (TESSALON) 100 MG capsule Take 1-2 capsules (100-200 mg total) by mouth 3 (three) times daily as needed for cough. 60 capsule Wynona Duhamel, Veryl Speak, PA-C   omeprazole (PRILOSEC) 20 MG capsule Take 1 capsule (20 mg total) by mouth daily for 14 days. 14 capsule Zailyn Rowser, Veryl Speak, PA-C     PDMP not reviewed this encounter.   Hermelinda Medicus, PA-C 03/03/20 1433

## 2020-03-04 ENCOUNTER — Other Ambulatory Visit (HOSPITAL_BASED_OUTPATIENT_CLINIC_OR_DEPARTMENT_OTHER): Payer: Self-pay | Admitting: Internal Medicine

## 2020-03-04 DIAGNOSIS — J3089 Other allergic rhinitis: Secondary | ICD-10-CM | POA: Diagnosis not present

## 2020-03-04 DIAGNOSIS — J309 Allergic rhinitis, unspecified: Secondary | ICD-10-CM | POA: Diagnosis not present

## 2020-03-04 DIAGNOSIS — R05 Cough: Secondary | ICD-10-CM | POA: Diagnosis not present

## 2020-03-04 DIAGNOSIS — J4540 Moderate persistent asthma, uncomplicated: Secondary | ICD-10-CM | POA: Diagnosis not present

## 2020-03-04 DIAGNOSIS — J301 Allergic rhinitis due to pollen: Secondary | ICD-10-CM | POA: Diagnosis not present

## 2020-03-04 MED FILL — TRELEGY ELLIPTA 100-62.5-25: 100-62.5-25 | 30 days supply | Qty: 60 | Fill #0

## 2020-03-07 DIAGNOSIS — J3089 Other allergic rhinitis: Secondary | ICD-10-CM | POA: Diagnosis not present

## 2020-03-07 DIAGNOSIS — J301 Allergic rhinitis due to pollen: Secondary | ICD-10-CM | POA: Diagnosis not present

## 2020-03-07 NOTE — Procedures (Signed)
    NAME: [facility] DATE OF BIRTH:  24-Jun-1966 MEDICAL RECORD NUMBER [id number]  LOCATION: [facility]  PHYSICIAN: Deretha Emory  DATE OF STUDY: 02/26/2020  SLEEP STUDY TYPE: Out of Center Sleep Test                REFERRING PHYSICIAN: Deretha Emory, MD  INDICATION FOR STUDY: snoring, witnessed apnea, awakening gasping for breath  EPWORTH SLEEPINESS SCORE:  NA HEIGHT:    WEIGHT:      There is no height or weight on file to calculate BMI.  NECK SIZE:   in.  Referring Provider: Juanita Craver, PA-C   Conclusions: 1. Severe sleep apnea (Respiratory Event Index (REI) 47/hr) - please note that REI approximates AHI but is not identical as a HST measures time of use and not time of sleep. 2. Severe desaturations (min O2 sat: 78%; time below 89%: 11 minutes   Recommendations: 1. The patient should be treated with weight loss (if appropriate), an oral appliance, or CPAP. If CPAP is chosen, this could be started by auto-CPAP or by CPAP titration in the lab.  2. Please note that a HST may underestimate the degree of obstructive sleep apnea due to the lack of EEG data. Therefore, respiratory effort related arousals (RERAs) will not be measured. If a prominent part of the patient's sleep disordered breathing are RERAs, then the HST will understate the severity of the patient's obstructive sleep apnea.   This study is a Type III Home Sleep Apnea Test measuring respiratory effort, airflow, heart rate, and oxygen saturation.   I certify that I have reviewed the entire raw data recording prior to the issuance of this report in accordance with the Standards of the American Academy of Sleep Medicine (AASM).    Deretha Emory Sleep specialist, American Board of Internal Medicine  ELECTRONICALLY SIGNED ON:  03/07/2020, 7:50 PM [facility] PH: [phone]   FX: [phone] ACCREDITED BY THE AMERICAN ACADEMY OF SLEEP MEDICINE

## 2020-03-08 DIAGNOSIS — G4733 Obstructive sleep apnea (adult) (pediatric): Secondary | ICD-10-CM | POA: Diagnosis not present

## 2020-03-09 DIAGNOSIS — J301 Allergic rhinitis due to pollen: Secondary | ICD-10-CM | POA: Diagnosis not present

## 2020-03-09 DIAGNOSIS — J3089 Other allergic rhinitis: Secondary | ICD-10-CM | POA: Diagnosis not present

## 2020-03-11 DIAGNOSIS — J3089 Other allergic rhinitis: Secondary | ICD-10-CM | POA: Diagnosis not present

## 2020-03-11 DIAGNOSIS — J301 Allergic rhinitis due to pollen: Secondary | ICD-10-CM | POA: Diagnosis not present

## 2020-03-14 DIAGNOSIS — J301 Allergic rhinitis due to pollen: Secondary | ICD-10-CM | POA: Diagnosis not present

## 2020-03-14 DIAGNOSIS — J3089 Other allergic rhinitis: Secondary | ICD-10-CM | POA: Diagnosis not present

## 2020-03-16 DIAGNOSIS — J3089 Other allergic rhinitis: Secondary | ICD-10-CM | POA: Diagnosis not present

## 2020-03-16 DIAGNOSIS — J301 Allergic rhinitis due to pollen: Secondary | ICD-10-CM | POA: Diagnosis not present

## 2020-03-18 DIAGNOSIS — J301 Allergic rhinitis due to pollen: Secondary | ICD-10-CM | POA: Diagnosis not present

## 2020-03-18 DIAGNOSIS — J3089 Other allergic rhinitis: Secondary | ICD-10-CM | POA: Diagnosis not present

## 2020-03-21 DIAGNOSIS — J301 Allergic rhinitis due to pollen: Secondary | ICD-10-CM | POA: Diagnosis not present

## 2020-03-21 DIAGNOSIS — J3089 Other allergic rhinitis: Secondary | ICD-10-CM | POA: Diagnosis not present

## 2020-03-23 DIAGNOSIS — J301 Allergic rhinitis due to pollen: Secondary | ICD-10-CM | POA: Diagnosis not present

## 2020-03-23 DIAGNOSIS — J3089 Other allergic rhinitis: Secondary | ICD-10-CM | POA: Diagnosis not present

## 2020-03-25 DIAGNOSIS — J3089 Other allergic rhinitis: Secondary | ICD-10-CM | POA: Diagnosis not present

## 2020-03-25 DIAGNOSIS — J301 Allergic rhinitis due to pollen: Secondary | ICD-10-CM | POA: Diagnosis not present

## 2020-03-28 DIAGNOSIS — J301 Allergic rhinitis due to pollen: Secondary | ICD-10-CM | POA: Diagnosis not present

## 2020-03-28 DIAGNOSIS — J3089 Other allergic rhinitis: Secondary | ICD-10-CM | POA: Diagnosis not present

## 2020-03-30 DIAGNOSIS — J3089 Other allergic rhinitis: Secondary | ICD-10-CM | POA: Diagnosis not present

## 2020-03-30 DIAGNOSIS — J301 Allergic rhinitis due to pollen: Secondary | ICD-10-CM | POA: Diagnosis not present

## 2020-03-30 MED FILL — TRELEGY ELLIPTA 100-62.5-25: 100-62.5-25 | 30 days supply | Qty: 60 | Fill #1

## 2020-03-30 MED FILL — CLOMIPHENE CITRATE 50 MG TA: 50 | 30 days supply | Qty: 30 | Fill #1

## 2020-03-30 MED FILL — MONTELUKAST SOD 10 MG TAB: 10 | 30 days supply | Qty: 30 | Fill #2

## 2020-03-30 MED FILL — LEVOTHYROXINE SODIUM 50 MCG: 50 | 90 days supply | Qty: 90 | Fill #2

## 2020-03-31 MED FILL — ALBUTEROL SULFATE HFA 108 (: 108 (90 BAS | 17 days supply | Qty: 18 | Fill #0

## 2020-04-05 DIAGNOSIS — J3089 Other allergic rhinitis: Secondary | ICD-10-CM | POA: Diagnosis not present

## 2020-04-05 DIAGNOSIS — J301 Allergic rhinitis due to pollen: Secondary | ICD-10-CM | POA: Diagnosis not present

## 2020-04-05 MED FILL — ANASTROZOLE 1 MG TABLET: 1 | 60 days supply | Qty: 60 | Fill #0

## 2020-04-07 DIAGNOSIS — J3089 Other allergic rhinitis: Secondary | ICD-10-CM | POA: Diagnosis not present

## 2020-04-07 DIAGNOSIS — J301 Allergic rhinitis due to pollen: Secondary | ICD-10-CM | POA: Diagnosis not present

## 2020-04-11 ENCOUNTER — Other Ambulatory Visit (HOSPITAL_BASED_OUTPATIENT_CLINIC_OR_DEPARTMENT_OTHER): Payer: Self-pay | Admitting: Urology

## 2020-04-11 ENCOUNTER — Other Ambulatory Visit: Payer: Self-pay

## 2020-04-11 ENCOUNTER — Ambulatory Visit: Payer: 59 | Admitting: Pulmonary Disease

## 2020-04-11 ENCOUNTER — Encounter: Payer: Self-pay | Admitting: Pulmonary Disease

## 2020-04-11 VITALS — BP 124/70 | HR 61 | Temp 98.1°F | Ht 66.0 in | Wt 245.8 lb

## 2020-04-11 DIAGNOSIS — R05 Cough: Secondary | ICD-10-CM | POA: Diagnosis not present

## 2020-04-11 DIAGNOSIS — J3089 Other allergic rhinitis: Secondary | ICD-10-CM | POA: Diagnosis not present

## 2020-04-11 DIAGNOSIS — J4520 Mild intermittent asthma, uncomplicated: Secondary | ICD-10-CM | POA: Diagnosis not present

## 2020-04-11 DIAGNOSIS — J301 Allergic rhinitis due to pollen: Secondary | ICD-10-CM | POA: Diagnosis not present

## 2020-04-11 DIAGNOSIS — R059 Cough, unspecified: Secondary | ICD-10-CM

## 2020-04-11 NOTE — Progress Notes (Signed)
Patient ID: Nicholas Mcintosh, male    DOB: November 13, 1965, 54 y.o.   MRN: [id number]  Chief Complaint  Patient presents with  . Pulmonary Consult    Self referral. Pt states he had been bothered by a cough for approx 6 months- this has resolved over the past 2 months since starting on allergy injections and trelegy. He is using his albuterol inhaler mainly just before exercise.     Referring provider: Georgann Housekeeper, MD  HPI:   Nicholas Mcintosh is a 54 y.o. man with PMH asthma, seasonal allergies, and HTN whom we are seeing on consultation at the request of self for evaluation of chronic cough. Urgent care notes 03/03/20 for same complaint reviewed.   Patient developed a cough 6 months ago. It is dry. Worse all during day and night. No clear triggers or alleviating factors. Describes sensation of burning in chest/thjroat. Mucous felt like it would get hung up in back of throat and trigger coughing fits.  Received prednisone and anti histamines without any improvement. Lost a lot of sleep. Was largely miserable. Was using Spiriva and symbicort without improvement. Developed thrush. Switched to Trelegy 6-8 weeks ago by PCP. Around same time he started allergy shots with allergist. His cough has markedly improved. Essentially gone. He is unsure if the shots or Trelegy helped most since both started at essentially the same time.    CXR 10/2019 and 02/2020 reviewed and interpreted as clear, on lateral view bilateral small pleural effusions noted.  Questionaires / Pulmonary Flowsheets:   ACT:  No flowsheet data found.  MMRC: No flowsheet data found.  Epworth:  No flowsheet data found.  Tests:   FENO:  No results found for: NITRICOXIDE  PFT: No flowsheet data found.  WALK:  No flowsheet data found.  Imaging: SLEEP STUDY DOCUMENTS  Result Date: 03/31/2020 Ordered by an unspecified provider.   Lab Results: Reviewed and as per EMR CBC    Component Value Date/Time   WBC 7.8 03/05/2015  2146   RBC 4.58 03/05/2015 2146   HGB 12.0 (L) 03/05/2015 2146   HCT 36.6 (L) 03/05/2015 2146   PLT 198 03/05/2015 2146   MCV 79.9 03/05/2015 2146   MCH 26.2 03/05/2015 2146   MCHC 32.8 03/05/2015 2146   RDW 16.3 (H) 03/05/2015 2146   LYMPHSABS 2.0 02/10/2015 1027   MONOABS 0.6 02/10/2015 1027   EOSABS 0.1 02/10/2015 1027   BASOSABS 0.0 02/10/2015 1027    BMET    Component Value Date/Time   NA 139 03/05/2015 2146   K 4.0 03/05/2015 2146   CL 104 03/05/2015 2146   CO2 28 03/05/2015 2146   GLUCOSE 127 (H) 03/05/2015 2146   BUN 18 03/05/2015 2146   CREATININE 1.03 03/05/2015 2146   CREATININE 1.47 (H) 02/10/2015 1027   CALCIUM 9.6 03/05/2015 2146   GFRNONAA >60 03/05/2015 2146   GFRNONAA 55 (L) 02/10/2015 1027   GFRAA >60 03/05/2015 2146   GFRAA 64 02/10/2015 1027    BNP No results found for: BNP  ProBNP No results found for: PROBNP  Specialty Problems    None      Allergies  Allergen Reactions  . Molds & Smuts     Asthma attacks around molds  . Dust Mite Extract Other (See Comments)    Ashtma    Immunization History  Administered Date(s) Administered  . Influenza,inj,Quad PF,6+ Mos 05/22/2013, 04/15/2014  . Tdap 08/04/2013    Past Medical History:  Diagnosis Date  . Chronic back pain  hx fall 2006 from 3 stories , vertebral fx  . Complication of anesthesia   . ED (erectile dysfunction)   . Exercise-induced asthma   . History of concussion    2006 from fall  . History of kidney stones   . Hypertension   . Hypothyroidism   . Nephrolithiasis    bilateral  . PONV (postoperative nausea and vomiting)   . Right ureteral stone     Tobacco History: Social History   Tobacco Use  Smoking Status Never Smoker  Smokeless Tobacco Never Used   Counseling given: Not Answered   Continue to not smoke  Outpatient Encounter Medications as of 04/11/2020  Medication Sig  . albuterol (PROVENTIL HFA;VENTOLIN HFA) 108 (90 BASE) MCG/ACT inhaler Inhale 2  puffs into the lungs every 4 (four) hours as needed for wheezing or shortness of breath.  Marland Kitchen amLODipine-valsartan (EXFORGE) 5-320 MG per tablet Take 1 tablet by mouth daily.  Marland Kitchen anastrozole (ARIMIDEX) 1 MG tablet Take 2 mg by mouth daily.   . clomiPHENE (CLOMID) 50 MG tablet   . diclofenac (VOLTAREN) 75 MG EC tablet   . doxazosin (CARDURA) 8 MG tablet   . folic acid (FOLVITE) 1 MG tablet TAKE 1 TABLET BY MOUTH DAILY.  Marland Kitchen ipratropium-albuterol (DUONEB) 0.5-2.5 (3) MG/3ML SOLN Take 3 mLs by nebulization every 4 (four) hours as needed.  Marland Kitchen levothyroxine (SYNTHROID, LEVOTHROID) 50 MCG tablet TAKE 1 TABLET BY MOUTH ONCE DAILY  . montelukast (SINGULAIR) 10 MG tablet   . oxyCODONE-acetaminophen (PERCOCET/ROXICET) 5-325 MG tablet Take 1 tablet by mouth 3 (three) times daily. Takes for chronic back pain  . [DISCONTINUED] Fluticasone-Umeclidin-Vilant (TRELEGY ELLIPTA IN) Inhale 1 puff into the lungs daily.  . TRELEGY ELLIPTA 100-62.5-25 MCG/INH AEPB Inhale 1 puff into the lungs daily.  . [DISCONTINUED] benzonatate (TESSALON) 100 MG capsule Take 1-2 capsules (100-200 mg total) by mouth 3 (three) times daily as needed for cough.  . [DISCONTINUED] DULoxetine (CYMBALTA) 60 MG capsule Take 1 capsule (60 mg total) by mouth daily.  . [DISCONTINUED] ipratropium (ATROVENT) 0.03 % nasal spray Place 2 sprays into both nostrils 2 (two) times daily.  . [DISCONTINUED] nystatin (MYCOSTATIN) 100000 UNIT/ML suspension Take 5 mLs (500,000 Units total) by mouth 4 (four) times daily.  . [DISCONTINUED] omeprazole (PRILOSEC) 20 MG capsule Take 1 capsule (20 mg total) by mouth daily for 14 days.  . [DISCONTINUED] sildenafil (VIAGRA) 100 MG tablet Take 1 tablet (100 mg total) by mouth daily as needed for erectile dysfunction.  . [DISCONTINUED] SPIRIVA RESPIMAT 1.25 MCG/ACT AERS SMARTSIG:2 Puff(s) Via Inhaler Daily  . [DISCONTINUED] SYMBICORT 160-4.5 MCG/ACT inhaler    No facility-administered encounter medications on file as of  04/11/2020.     Review of Systems  Review of Systems  No post nasal drip as of now. No GERD. No DOE or chest pain. Comprehensive ROS otherwise negative.   Physical Exam  BP 124/70 (BP Location: Left Arm, Cuff Size: Large)   Pulse 61   Temp 98.1 F (36.7 C) (Temporal)   Ht 5\' 6"  (1.676 m)   Wt 245 lb 12.8 oz (111.5 kg)   SpO2 99% Comment: on RA  BMI 39.67 kg/m   Wt Readings from Last 5 Encounters:  04/11/20 245 lb 12.8 oz (111.5 kg)  11/09/19 242 lb (109.8 kg)  02/03/18 234 lb (106.1 kg)  12/03/16 234 lb (106.1 kg)  06/30/15 230 lb (104.3 kg)    BMI Readings from Last 5 Encounters:  04/11/20 39.67 kg/m  11/09/19 39.06 kg/m  02/03/18 37.20 kg/m  12/03/16 37.20 kg/m  06/30/15 36.02 kg/m     Physical Exam General: Well developed, in NAD Eyes: EOMI, no icterus Neck: No JVP appreciated, supple Respiratory: NWOB, CTAB, no wheeze CV: RRR, no murmur Abd: soft, BS present MSK: no synovitis, no joint effusions Neuro: normal gait, no weakness Psych: normal mood, full affect   Assessment & Plan:   Chronic Cough: Present for months. Resolved with Trelegy and allergy shots. Did not improve with prednisone. As such, more inclined to say allergy shots have helped more than Trelegy. To continue both.   Asthma: Mild, intermittent. Well controlled at baseline. To continue Trelegy mid dose. RTC in 4 months and if stable will attempt to wean inhaler therapy.    Return in about 6 months (around 10/09/2020).   Karren Burly, MD 04/11/2020

## 2020-04-11 NOTE — Patient Instructions (Addendum)
Nice to meet you!  Continue the Trelegy and allergy shots.  We will re-address in 4 months (6 months total on both) and if able we can decrease the intensity of the inhaler.  4 month follow up with Dr. Judeth Horn.

## 2020-04-13 DIAGNOSIS — G4733 Obstructive sleep apnea (adult) (pediatric): Secondary | ICD-10-CM | POA: Diagnosis not present

## 2020-04-13 DIAGNOSIS — J301 Allergic rhinitis due to pollen: Secondary | ICD-10-CM | POA: Diagnosis not present

## 2020-04-13 DIAGNOSIS — J3089 Other allergic rhinitis: Secondary | ICD-10-CM | POA: Diagnosis not present

## 2020-04-18 DIAGNOSIS — J3089 Other allergic rhinitis: Secondary | ICD-10-CM | POA: Diagnosis not present

## 2020-04-18 DIAGNOSIS — J301 Allergic rhinitis due to pollen: Secondary | ICD-10-CM | POA: Diagnosis not present

## 2020-04-21 DIAGNOSIS — J301 Allergic rhinitis due to pollen: Secondary | ICD-10-CM | POA: Diagnosis not present

## 2020-04-21 DIAGNOSIS — J3089 Other allergic rhinitis: Secondary | ICD-10-CM | POA: Diagnosis not present

## 2020-04-25 DIAGNOSIS — J301 Allergic rhinitis due to pollen: Secondary | ICD-10-CM | POA: Diagnosis not present

## 2020-04-25 DIAGNOSIS — J3089 Other allergic rhinitis: Secondary | ICD-10-CM | POA: Diagnosis not present

## 2020-04-28 ENCOUNTER — Other Ambulatory Visit (HOSPITAL_BASED_OUTPATIENT_CLINIC_OR_DEPARTMENT_OTHER): Payer: Self-pay | Admitting: Internal Medicine

## 2020-04-28 DIAGNOSIS — J3089 Other allergic rhinitis: Secondary | ICD-10-CM | POA: Diagnosis not present

## 2020-04-28 DIAGNOSIS — J301 Allergic rhinitis due to pollen: Secondary | ICD-10-CM | POA: Diagnosis not present

## 2020-04-28 MED FILL — MONTELUKAST SOD 10 MG TAB: 10 | 30 days supply | Qty: 30 | Fill #3

## 2020-04-28 MED FILL — SPIRIVA RESPIMAT 1.25 MCG I: 1.25 | 30 days supply | Qty: 4 | Fill #3

## 2020-05-02 DIAGNOSIS — J3089 Other allergic rhinitis: Secondary | ICD-10-CM | POA: Diagnosis not present

## 2020-05-02 DIAGNOSIS — J3081 Allergic rhinitis due to animal (cat) (dog) hair and dander: Secondary | ICD-10-CM | POA: Diagnosis not present

## 2020-05-02 DIAGNOSIS — J301 Allergic rhinitis due to pollen: Secondary | ICD-10-CM | POA: Diagnosis not present

## 2020-05-03 DIAGNOSIS — G4733 Obstructive sleep apnea (adult) (pediatric): Secondary | ICD-10-CM | POA: Diagnosis not present

## 2020-05-04 MED FILL — CLOMIPHENE CITRATE 50 MG TA: 50 | 30 days supply | Qty: 30 | Fill #2

## 2020-05-05 DIAGNOSIS — J301 Allergic rhinitis due to pollen: Secondary | ICD-10-CM | POA: Diagnosis not present

## 2020-05-05 DIAGNOSIS — J3089 Other allergic rhinitis: Secondary | ICD-10-CM | POA: Diagnosis not present

## 2020-05-12 DIAGNOSIS — J3089 Other allergic rhinitis: Secondary | ICD-10-CM | POA: Diagnosis not present

## 2020-05-12 DIAGNOSIS — J301 Allergic rhinitis due to pollen: Secondary | ICD-10-CM | POA: Diagnosis not present

## 2020-05-12 DIAGNOSIS — Z23 Encounter for immunization: Secondary | ICD-10-CM | POA: Diagnosis not present

## 2020-05-13 DIAGNOSIS — G4733 Obstructive sleep apnea (adult) (pediatric): Secondary | ICD-10-CM | POA: Diagnosis not present

## 2020-05-19 DIAGNOSIS — J3089 Other allergic rhinitis: Secondary | ICD-10-CM | POA: Diagnosis not present

## 2020-05-19 DIAGNOSIS — J301 Allergic rhinitis due to pollen: Secondary | ICD-10-CM | POA: Diagnosis not present

## 2020-05-26 DIAGNOSIS — J301 Allergic rhinitis due to pollen: Secondary | ICD-10-CM | POA: Diagnosis not present

## 2020-05-26 DIAGNOSIS — J3089 Other allergic rhinitis: Secondary | ICD-10-CM | POA: Diagnosis not present

## 2020-05-30 ENCOUNTER — Other Ambulatory Visit (HOSPITAL_BASED_OUTPATIENT_CLINIC_OR_DEPARTMENT_OTHER): Payer: Self-pay | Admitting: Internal Medicine

## 2020-05-30 MED FILL — TRELEGY ELLIPTA 100-62.5-25: 100-62.5-25 | 30 days supply | Qty: 60 | Fill #2

## 2020-05-30 MED FILL — FOLIC ACID 1 MG TABS: 1 | 90 days supply | Qty: 90 | Fill #0

## 2020-05-30 MED FILL — DOXAZOSIN MESYLATE 8 MG TAB: 8 | 90 days supply | Qty: 90 | Fill #0

## 2020-05-30 MED FILL — MONTELUKAST SOD 10 MG TAB: 10 | 30 days supply | Qty: 30 | Fill #4

## 2020-05-30 MED FILL — AMLODIPINE-VALSARTAN 5-320: 5-320 | 90 days supply | Qty: 90 | Fill #1

## 2020-05-30 MED FILL — ANASTROZOLE 1 MG TABLET: 1 | 90 days supply | Qty: 90 | Fill #0

## 2020-05-30 MED FILL — CLOMIPHENE CITRATE 50 MG TA: 50 | 30 days supply | Qty: 30 | Fill #3

## 2020-06-01 DIAGNOSIS — J301 Allergic rhinitis due to pollen: Secondary | ICD-10-CM | POA: Diagnosis not present

## 2020-06-02 DIAGNOSIS — J301 Allergic rhinitis due to pollen: Secondary | ICD-10-CM | POA: Diagnosis not present

## 2020-06-02 DIAGNOSIS — J3089 Other allergic rhinitis: Secondary | ICD-10-CM | POA: Diagnosis not present

## 2020-06-09 DIAGNOSIS — J3089 Other allergic rhinitis: Secondary | ICD-10-CM | POA: Diagnosis not present

## 2020-06-09 DIAGNOSIS — J301 Allergic rhinitis due to pollen: Secondary | ICD-10-CM | POA: Diagnosis not present

## 2020-06-13 DIAGNOSIS — G4733 Obstructive sleep apnea (adult) (pediatric): Secondary | ICD-10-CM | POA: Diagnosis not present

## 2020-06-15 DIAGNOSIS — J301 Allergic rhinitis due to pollen: Secondary | ICD-10-CM | POA: Diagnosis not present

## 2020-06-15 DIAGNOSIS — J3089 Other allergic rhinitis: Secondary | ICD-10-CM | POA: Diagnosis not present

## 2020-06-22 DIAGNOSIS — J3089 Other allergic rhinitis: Secondary | ICD-10-CM | POA: Diagnosis not present

## 2020-06-22 DIAGNOSIS — J301 Allergic rhinitis due to pollen: Secondary | ICD-10-CM | POA: Diagnosis not present

## 2020-06-30 MED FILL — TRELEGY ELLIPTA 100-62.5-25: 100-62.5-25 | 30 days supply | Qty: 60 | Fill #3

## 2020-06-30 MED FILL — LEVOTHYROXINE SODIUM 50 MCG: 50 | 90 days supply | Qty: 90 | Fill #0

## 2020-07-01 DIAGNOSIS — J3089 Other allergic rhinitis: Secondary | ICD-10-CM | POA: Diagnosis not present

## 2020-07-01 DIAGNOSIS — J301 Allergic rhinitis due to pollen: Secondary | ICD-10-CM | POA: Diagnosis not present

## 2020-07-06 DIAGNOSIS — J3089 Other allergic rhinitis: Secondary | ICD-10-CM | POA: Diagnosis not present

## 2020-07-06 DIAGNOSIS — J301 Allergic rhinitis due to pollen: Secondary | ICD-10-CM | POA: Diagnosis not present

## 2020-07-08 DIAGNOSIS — G4733 Obstructive sleep apnea (adult) (pediatric): Secondary | ICD-10-CM | POA: Diagnosis not present

## 2020-07-08 MED FILL — TADALAFIL 20 MG TABS: 20 | 90 days supply | Qty: 18 | Fill #1

## 2020-07-13 DIAGNOSIS — G4733 Obstructive sleep apnea (adult) (pediatric): Secondary | ICD-10-CM | POA: Diagnosis not present

## 2020-07-13 DIAGNOSIS — J301 Allergic rhinitis due to pollen: Secondary | ICD-10-CM | POA: Diagnosis not present

## 2020-07-13 DIAGNOSIS — J3089 Other allergic rhinitis: Secondary | ICD-10-CM | POA: Diagnosis not present

## 2020-07-14 DIAGNOSIS — E291 Testicular hypofunction: Secondary | ICD-10-CM | POA: Diagnosis not present

## 2020-07-14 DIAGNOSIS — N400 Enlarged prostate without lower urinary tract symptoms: Secondary | ICD-10-CM | POA: Diagnosis not present

## 2020-07-18 DIAGNOSIS — G4733 Obstructive sleep apnea (adult) (pediatric): Secondary | ICD-10-CM | POA: Diagnosis not present

## 2020-07-20 DIAGNOSIS — J3089 Other allergic rhinitis: Secondary | ICD-10-CM | POA: Diagnosis not present

## 2020-07-20 DIAGNOSIS — J301 Allergic rhinitis due to pollen: Secondary | ICD-10-CM | POA: Diagnosis not present

## 2020-07-21 ENCOUNTER — Other Ambulatory Visit (HOSPITAL_BASED_OUTPATIENT_CLINIC_OR_DEPARTMENT_OTHER): Payer: Self-pay | Admitting: Urology

## 2020-07-21 DIAGNOSIS — E291 Testicular hypofunction: Secondary | ICD-10-CM | POA: Diagnosis not present

## 2020-07-21 DIAGNOSIS — N200 Calculus of kidney: Secondary | ICD-10-CM | POA: Diagnosis not present

## 2020-07-21 DIAGNOSIS — N400 Enlarged prostate without lower urinary tract symptoms: Secondary | ICD-10-CM | POA: Diagnosis not present

## 2020-07-21 MED FILL — CLOMIPHENE CITRATE 50 MG TA: 50 | 90 days supply | Qty: 90 | Fill #0

## 2020-07-21 MED FILL — SILDENAFIL CITRATE 100 MG T: 100 | 30 days supply | Qty: 6 | Fill #0

## 2020-07-27 DIAGNOSIS — J3089 Other allergic rhinitis: Secondary | ICD-10-CM | POA: Diagnosis not present

## 2020-07-27 DIAGNOSIS — J301 Allergic rhinitis due to pollen: Secondary | ICD-10-CM | POA: Diagnosis not present

## 2020-07-27 MED FILL — TRELEGY ELLIPTA 100-62.5-25: 100-62.5-25 | 30 days supply | Qty: 60 | Fill #4

## 2020-07-28 DIAGNOSIS — I10 Essential (primary) hypertension: Secondary | ICD-10-CM | POA: Diagnosis not present

## 2020-07-28 DIAGNOSIS — E039 Hypothyroidism, unspecified: Secondary | ICD-10-CM | POA: Diagnosis not present

## 2020-07-28 DIAGNOSIS — E291 Testicular hypofunction: Secondary | ICD-10-CM | POA: Diagnosis not present

## 2020-07-28 DIAGNOSIS — Z13220 Encounter for screening for lipoid disorders: Secondary | ICD-10-CM | POA: Diagnosis not present

## 2020-07-28 DIAGNOSIS — Z0000 Encounter for general adult medical examination without abnormal findings: Secondary | ICD-10-CM | POA: Diagnosis not present

## 2020-07-28 DIAGNOSIS — G4733 Obstructive sleep apnea (adult) (pediatric): Secondary | ICD-10-CM | POA: Diagnosis not present

## 2020-07-28 DIAGNOSIS — G894 Chronic pain syndrome: Secondary | ICD-10-CM | POA: Diagnosis not present

## 2020-07-28 DIAGNOSIS — J4540 Moderate persistent asthma, uncomplicated: Secondary | ICD-10-CM | POA: Diagnosis not present

## 2020-07-28 DIAGNOSIS — Z1389 Encounter for screening for other disorder: Secondary | ICD-10-CM | POA: Diagnosis not present

## 2020-07-28 DIAGNOSIS — Z125 Encounter for screening for malignant neoplasm of prostate: Secondary | ICD-10-CM | POA: Diagnosis not present

## 2020-08-03 DIAGNOSIS — J3089 Other allergic rhinitis: Secondary | ICD-10-CM | POA: Diagnosis not present

## 2020-08-03 DIAGNOSIS — J301 Allergic rhinitis due to pollen: Secondary | ICD-10-CM | POA: Diagnosis not present

## 2020-08-05 ENCOUNTER — Ambulatory Visit: Payer: 59 | Attending: Internal Medicine

## 2020-08-05 ENCOUNTER — Other Ambulatory Visit (HOSPITAL_BASED_OUTPATIENT_CLINIC_OR_DEPARTMENT_OTHER): Payer: Self-pay | Admitting: Internal Medicine

## 2020-08-05 DIAGNOSIS — R7303 Prediabetes: Secondary | ICD-10-CM | POA: Diagnosis not present

## 2020-08-05 DIAGNOSIS — I10 Essential (primary) hypertension: Secondary | ICD-10-CM | POA: Diagnosis not present

## 2020-08-05 DIAGNOSIS — E785 Hyperlipidemia, unspecified: Secondary | ICD-10-CM | POA: Diagnosis not present

## 2020-08-05 DIAGNOSIS — N200 Calculus of kidney: Secondary | ICD-10-CM | POA: Diagnosis not present

## 2020-08-05 DIAGNOSIS — Z23 Encounter for immunization: Secondary | ICD-10-CM

## 2020-08-05 DIAGNOSIS — E039 Hypothyroidism, unspecified: Secondary | ICD-10-CM | POA: Diagnosis not present

## 2020-08-05 NOTE — Progress Notes (Signed)
   Covid-19 Vaccination Clinic  Name:  Nicholas Mcintosh    MRN: [id number] DOB: 06-26-1966  08/05/2020  [name] was observed post Covid-19 immunization for 15 minutes without incident. He was provided with Vaccine Information Sheet and instruction to access the V-Safe system.   [name] was instructed to call 911 with any severe reactions post vaccine: Marland Kitchen Difficulty breathing  . Swelling of face and throat  . A fast heartbeat  . A bad rash all over body  . Dizziness and weakness   Immunizations Administered    Name Date Dose VIS Date Route   Pfizer COVID-19 Vaccine 08/05/2020 10:47 AM 0.3 mL 05/18/2020 Intramuscular   Manufacturer: [employer], [employer]   Lot: [id number]   NDC: [address]-1

## 2020-08-08 MED FILL — PFIZER-BIONTECH COVID-19 VA: 30 | 21 days supply | Qty: 0 | Fill #0

## 2020-08-09 DIAGNOSIS — G4733 Obstructive sleep apnea (adult) (pediatric): Secondary | ICD-10-CM | POA: Diagnosis not present

## 2020-08-10 ENCOUNTER — Other Ambulatory Visit (HOSPITAL_BASED_OUTPATIENT_CLINIC_OR_DEPARTMENT_OTHER): Payer: Self-pay | Admitting: Allergy and Immunology

## 2020-08-10 MED FILL — MONTELUKAST SOD 10 MG TAB: 10 | 30 days supply | Qty: 30 | Fill #0

## 2020-08-10 MED FILL — ANASTROZOLE 1 MG TABLET: 1 | 90 days supply | Qty: 90 | Fill #0

## 2020-08-17 ENCOUNTER — Other Ambulatory Visit (HOSPITAL_BASED_OUTPATIENT_CLINIC_OR_DEPARTMENT_OTHER): Payer: Self-pay | Admitting: Allergy and Immunology

## 2020-08-17 DIAGNOSIS — J3089 Other allergic rhinitis: Secondary | ICD-10-CM | POA: Diagnosis not present

## 2020-08-17 DIAGNOSIS — J301 Allergic rhinitis due to pollen: Secondary | ICD-10-CM | POA: Diagnosis not present

## 2020-08-30 ENCOUNTER — Other Ambulatory Visit (HOSPITAL_BASED_OUTPATIENT_CLINIC_OR_DEPARTMENT_OTHER): Payer: Self-pay | Admitting: Allergy and Immunology

## 2020-08-30 DIAGNOSIS — T781XXD Other adverse food reactions, not elsewhere classified, subsequent encounter: Secondary | ICD-10-CM | POA: Diagnosis not present

## 2020-08-30 DIAGNOSIS — J3089 Other allergic rhinitis: Secondary | ICD-10-CM | POA: Diagnosis not present

## 2020-08-30 DIAGNOSIS — J4540 Moderate persistent asthma, uncomplicated: Secondary | ICD-10-CM | POA: Diagnosis not present

## 2020-08-30 DIAGNOSIS — J301 Allergic rhinitis due to pollen: Secondary | ICD-10-CM | POA: Diagnosis not present

## 2020-08-30 MED FILL — FLONASE SENSIMIST 27.5 MCG: 27.5 | 90 days supply | Qty: 27 | Fill #0

## 2020-09-01 ENCOUNTER — Telehealth: Payer: Self-pay | Admitting: Podiatry

## 2020-09-01 DIAGNOSIS — M722 Plantar fascial fibromatosis: Secondary | ICD-10-CM | POA: Diagnosis not present

## 2020-09-01 MED FILL — TRELEGY ELLIPTA 100-62.5-25: 100-62.5-25 | 30 days supply | Qty: 60 | Fill #5

## 2020-09-01 NOTE — Telephone Encounter (Signed)
Pt called asking about getting another pair of orthotics. It has not been a yr since last appt. Pt asked if insurance would cover and I told him normally [facility]([place]) covers 80% after deductible. He said he wants to proceed and I told him I would get them ordered and bill the insurance as requested and call pt to pick up when they come in.

## 2020-09-07 DIAGNOSIS — J301 Allergic rhinitis due to pollen: Secondary | ICD-10-CM | POA: Diagnosis not present

## 2020-09-07 DIAGNOSIS — J3089 Other allergic rhinitis: Secondary | ICD-10-CM | POA: Diagnosis not present

## 2020-09-08 DIAGNOSIS — R0981 Nasal congestion: Secondary | ICD-10-CM | POA: Diagnosis not present

## 2020-09-08 DIAGNOSIS — I10 Essential (primary) hypertension: Secondary | ICD-10-CM | POA: Diagnosis not present

## 2020-09-09 ENCOUNTER — Other Ambulatory Visit (HOSPITAL_BASED_OUTPATIENT_CLINIC_OR_DEPARTMENT_OTHER): Payer: Self-pay | Admitting: Internal Medicine

## 2020-09-09 DIAGNOSIS — I10 Essential (primary) hypertension: Secondary | ICD-10-CM | POA: Diagnosis not present

## 2020-09-09 MED FILL — AMLODIPINE-VALSARTAN 10-320: 10-320 | 30 days supply | Qty: 30 | Fill #0

## 2020-09-14 DIAGNOSIS — J301 Allergic rhinitis due to pollen: Secondary | ICD-10-CM | POA: Diagnosis not present

## 2020-09-14 DIAGNOSIS — J3081 Allergic rhinitis due to animal (cat) (dog) hair and dander: Secondary | ICD-10-CM | POA: Diagnosis not present

## 2020-09-14 DIAGNOSIS — J3089 Other allergic rhinitis: Secondary | ICD-10-CM | POA: Diagnosis not present

## 2020-09-19 MED FILL — FOLIC ACID 1 MG TABS: 1 | 90 days supply | Qty: 90 | Fill #1

## 2020-09-19 MED FILL — MONTELUKAST SOD 10 MG TAB: 10 | 30 days supply | Qty: 30 | Fill #1

## 2020-09-19 MED FILL — DOXAZOSIN MESYLATE 8 MG TAB: 8 | 90 days supply | Qty: 90 | Fill #1

## 2020-09-28 ENCOUNTER — Other Ambulatory Visit (HOSPITAL_BASED_OUTPATIENT_CLINIC_OR_DEPARTMENT_OTHER): Payer: Self-pay | Admitting: Internal Medicine

## 2020-09-28 DIAGNOSIS — J301 Allergic rhinitis due to pollen: Secondary | ICD-10-CM | POA: Diagnosis not present

## 2020-09-28 DIAGNOSIS — J3089 Other allergic rhinitis: Secondary | ICD-10-CM | POA: Diagnosis not present

## 2020-09-28 MED FILL — TRELEGY ELLIPTA 100-62.5-25: 100-62.5-25 | 30 days supply | Qty: 60 | Fill #0

## 2020-09-28 MED FILL — LEVOTHYROXINE SODIUM 50 MCG: 50 | 90 days supply | Qty: 90 | Fill #1

## 2020-10-12 DIAGNOSIS — J3089 Other allergic rhinitis: Secondary | ICD-10-CM | POA: Diagnosis not present

## 2020-10-12 DIAGNOSIS — J301 Allergic rhinitis due to pollen: Secondary | ICD-10-CM | POA: Diagnosis not present

## 2020-10-17 MED FILL — AMLODIPINE-VALSARTAN 10-320: 10-320 | 30 days supply | Qty: 30 | Fill #1

## 2020-10-19 DIAGNOSIS — J3089 Other allergic rhinitis: Secondary | ICD-10-CM | POA: Diagnosis not present

## 2020-10-19 DIAGNOSIS — J301 Allergic rhinitis due to pollen: Secondary | ICD-10-CM | POA: Diagnosis not present

## 2020-10-20 MED FILL — MONTELUKAST SOD 10 MG TAB: 10 | 90 days supply | Qty: 90 | Fill #0

## 2020-10-24 DIAGNOSIS — E039 Hypothyroidism, unspecified: Secondary | ICD-10-CM | POA: Diagnosis not present

## 2020-11-02 ENCOUNTER — Other Ambulatory Visit (HOSPITAL_BASED_OUTPATIENT_CLINIC_OR_DEPARTMENT_OTHER): Payer: Self-pay

## 2020-11-02 DIAGNOSIS — J301 Allergic rhinitis due to pollen: Secondary | ICD-10-CM | POA: Diagnosis not present

## 2020-11-02 DIAGNOSIS — J3089 Other allergic rhinitis: Secondary | ICD-10-CM | POA: Diagnosis not present

## 2020-11-02 MED FILL — Fluticasone-Umeclidinium-Vilanterol AEPB 100-62.5-25 MCG/ACT: RESPIRATORY_TRACT | 30 days supply | Qty: 60 | Fill #0 | Status: AC

## 2020-11-03 DIAGNOSIS — G894 Chronic pain syndrome: Secondary | ICD-10-CM | POA: Diagnosis not present

## 2020-11-03 DIAGNOSIS — F331 Major depressive disorder, recurrent, moderate: Secondary | ICD-10-CM | POA: Diagnosis not present

## 2020-11-03 DIAGNOSIS — I10 Essential (primary) hypertension: Secondary | ICD-10-CM | POA: Diagnosis not present

## 2020-11-03 DIAGNOSIS — E039 Hypothyroidism, unspecified: Secondary | ICD-10-CM | POA: Diagnosis not present

## 2020-11-03 DIAGNOSIS — J4540 Moderate persistent asthma, uncomplicated: Secondary | ICD-10-CM | POA: Diagnosis not present

## 2020-11-03 DIAGNOSIS — K219 Gastro-esophageal reflux disease without esophagitis: Secondary | ICD-10-CM | POA: Diagnosis not present

## 2020-11-03 DIAGNOSIS — G4733 Obstructive sleep apnea (adult) (pediatric): Secondary | ICD-10-CM | POA: Diagnosis not present

## 2020-11-04 ENCOUNTER — Other Ambulatory Visit (HOSPITAL_BASED_OUTPATIENT_CLINIC_OR_DEPARTMENT_OTHER): Payer: Self-pay

## 2020-11-04 MED FILL — Clomiphene Citrate Tab 50 MG: ORAL | 90 days supply | Qty: 90 | Fill #0 | Status: AC

## 2020-11-08 ENCOUNTER — Other Ambulatory Visit (HOSPITAL_BASED_OUTPATIENT_CLINIC_OR_DEPARTMENT_OTHER): Payer: Self-pay

## 2020-11-10 ENCOUNTER — Other Ambulatory Visit (HOSPITAL_BASED_OUTPATIENT_CLINIC_OR_DEPARTMENT_OTHER): Payer: Self-pay

## 2020-11-10 DIAGNOSIS — J4540 Moderate persistent asthma, uncomplicated: Secondary | ICD-10-CM | POA: Diagnosis not present

## 2020-11-10 DIAGNOSIS — T781XXD Other adverse food reactions, not elsewhere classified, subsequent encounter: Secondary | ICD-10-CM | POA: Diagnosis not present

## 2020-11-10 DIAGNOSIS — J3089 Other allergic rhinitis: Secondary | ICD-10-CM | POA: Diagnosis not present

## 2020-11-10 DIAGNOSIS — J301 Allergic rhinitis due to pollen: Secondary | ICD-10-CM | POA: Diagnosis not present

## 2020-11-10 MED ORDER — CEFDINIR 300 MG PO CAPS
ORAL_CAPSULE | ORAL | 0 refills | Status: DC
  Filled 2020-11-10: qty 20, 10d supply, fill #0

## 2020-11-18 ENCOUNTER — Other Ambulatory Visit (HOSPITAL_BASED_OUTPATIENT_CLINIC_OR_DEPARTMENT_OTHER): Payer: Self-pay

## 2020-11-18 DIAGNOSIS — J301 Allergic rhinitis due to pollen: Secondary | ICD-10-CM | POA: Diagnosis not present

## 2020-11-18 DIAGNOSIS — J3089 Other allergic rhinitis: Secondary | ICD-10-CM | POA: Diagnosis not present

## 2020-11-18 MED FILL — Clomiphene Citrate Tab 50 MG: ORAL | Qty: 90 | Fill #0 | Status: CN

## 2020-11-21 ENCOUNTER — Other Ambulatory Visit (HOSPITAL_BASED_OUTPATIENT_CLINIC_OR_DEPARTMENT_OTHER): Payer: Self-pay

## 2020-11-21 ENCOUNTER — Other Ambulatory Visit: Payer: Self-pay | Admitting: Internal Medicine

## 2020-11-21 MED FILL — Amlodipine Besylate-Valsartan Tab 10-320 MG: ORAL | 30 days supply | Qty: 30 | Fill #0 | Status: CN

## 2020-11-22 DIAGNOSIS — G4733 Obstructive sleep apnea (adult) (pediatric): Secondary | ICD-10-CM | POA: Diagnosis not present

## 2020-11-23 ENCOUNTER — Other Ambulatory Visit: Payer: Self-pay | Admitting: Internal Medicine

## 2020-11-23 ENCOUNTER — Other Ambulatory Visit (HOSPITAL_BASED_OUTPATIENT_CLINIC_OR_DEPARTMENT_OTHER): Payer: Self-pay

## 2020-11-24 ENCOUNTER — Other Ambulatory Visit (HOSPITAL_BASED_OUTPATIENT_CLINIC_OR_DEPARTMENT_OTHER): Payer: Self-pay

## 2020-11-24 MED FILL — Amlodipine Besylate-Valsartan Tab 10-320 MG: ORAL | 90 days supply | Qty: 90 | Fill #0 | Status: AC

## 2020-11-25 ENCOUNTER — Other Ambulatory Visit (HOSPITAL_BASED_OUTPATIENT_CLINIC_OR_DEPARTMENT_OTHER): Payer: Self-pay

## 2020-11-29 ENCOUNTER — Other Ambulatory Visit (HOSPITAL_BASED_OUTPATIENT_CLINIC_OR_DEPARTMENT_OTHER): Payer: Self-pay

## 2020-11-29 DIAGNOSIS — J3089 Other allergic rhinitis: Secondary | ICD-10-CM | POA: Diagnosis not present

## 2020-11-29 DIAGNOSIS — J301 Allergic rhinitis due to pollen: Secondary | ICD-10-CM | POA: Diagnosis not present

## 2020-12-05 ENCOUNTER — Other Ambulatory Visit (HOSPITAL_BASED_OUTPATIENT_CLINIC_OR_DEPARTMENT_OTHER): Payer: Self-pay

## 2020-12-05 MED FILL — Fluticasone-Umeclidinium-Vilanterol AEPB 100-62.5-25 MCG/ACT: RESPIRATORY_TRACT | 30 days supply | Qty: 60 | Fill #1 | Status: AC

## 2020-12-06 ENCOUNTER — Other Ambulatory Visit (HOSPITAL_BASED_OUTPATIENT_CLINIC_OR_DEPARTMENT_OTHER): Payer: Self-pay

## 2020-12-06 DIAGNOSIS — J301 Allergic rhinitis due to pollen: Secondary | ICD-10-CM | POA: Diagnosis not present

## 2020-12-06 DIAGNOSIS — J3089 Other allergic rhinitis: Secondary | ICD-10-CM | POA: Diagnosis not present

## 2020-12-14 DIAGNOSIS — J301 Allergic rhinitis due to pollen: Secondary | ICD-10-CM | POA: Diagnosis not present

## 2020-12-14 DIAGNOSIS — J3089 Other allergic rhinitis: Secondary | ICD-10-CM | POA: Diagnosis not present

## 2020-12-21 DIAGNOSIS — J301 Allergic rhinitis due to pollen: Secondary | ICD-10-CM | POA: Diagnosis not present

## 2020-12-21 DIAGNOSIS — J3089 Other allergic rhinitis: Secondary | ICD-10-CM | POA: Diagnosis not present

## 2021-01-02 DIAGNOSIS — J3089 Other allergic rhinitis: Secondary | ICD-10-CM | POA: Diagnosis not present

## 2021-01-02 DIAGNOSIS — J301 Allergic rhinitis due to pollen: Secondary | ICD-10-CM | POA: Diagnosis not present

## 2021-01-03 DIAGNOSIS — J301 Allergic rhinitis due to pollen: Secondary | ICD-10-CM | POA: Diagnosis not present

## 2021-01-04 DIAGNOSIS — J3089 Other allergic rhinitis: Secondary | ICD-10-CM | POA: Diagnosis not present

## 2021-01-09 ENCOUNTER — Other Ambulatory Visit (HOSPITAL_BASED_OUTPATIENT_CLINIC_OR_DEPARTMENT_OTHER): Payer: Self-pay

## 2021-01-09 MED FILL — Folic Acid Tab 1 MG: ORAL | 90 days supply | Qty: 90 | Fill #0 | Status: AC

## 2021-01-09 MED FILL — Montelukast Sodium Tab 10 MG (Base Equiv): ORAL | 90 days supply | Qty: 90 | Fill #0 | Status: AC

## 2021-01-09 MED FILL — Fluticasone-Umeclidinium-Vilanterol AEPB 100-62.5-25 MCG/ACT: RESPIRATORY_TRACT | 30 days supply | Qty: 60 | Fill #2 | Status: AC

## 2021-01-09 MED FILL — Levothyroxine Sodium Tab 50 MCG: ORAL | 90 days supply | Qty: 90 | Fill #0 | Status: AC

## 2021-01-09 MED FILL — Anastrozole Tab 1 MG: ORAL | 90 days supply | Qty: 90 | Fill #0 | Status: AC

## 2021-01-09 MED FILL — Doxazosin Mesylate Tab 8 MG: ORAL | 90 days supply | Qty: 90 | Fill #0 | Status: AC

## 2021-01-11 DIAGNOSIS — E291 Testicular hypofunction: Secondary | ICD-10-CM | POA: Diagnosis not present

## 2021-01-13 DIAGNOSIS — J3089 Other allergic rhinitis: Secondary | ICD-10-CM | POA: Diagnosis not present

## 2021-01-13 DIAGNOSIS — J301 Allergic rhinitis due to pollen: Secondary | ICD-10-CM | POA: Diagnosis not present

## 2021-01-19 DIAGNOSIS — N200 Calculus of kidney: Secondary | ICD-10-CM | POA: Diagnosis not present

## 2021-01-19 DIAGNOSIS — E291 Testicular hypofunction: Secondary | ICD-10-CM | POA: Diagnosis not present

## 2021-01-25 DIAGNOSIS — J3089 Other allergic rhinitis: Secondary | ICD-10-CM | POA: Diagnosis not present

## 2021-01-25 DIAGNOSIS — J301 Allergic rhinitis due to pollen: Secondary | ICD-10-CM | POA: Diagnosis not present

## 2021-02-02 DIAGNOSIS — J301 Allergic rhinitis due to pollen: Secondary | ICD-10-CM | POA: Diagnosis not present

## 2021-02-02 DIAGNOSIS — J3089 Other allergic rhinitis: Secondary | ICD-10-CM | POA: Diagnosis not present

## 2021-02-05 MED FILL — Fluticasone-Umeclidinium-Vilanterol AEPB 100-62.5-25 MCG/ACT: RESPIRATORY_TRACT | 30 days supply | Qty: 60 | Fill #3 | Status: AC

## 2021-02-06 ENCOUNTER — Other Ambulatory Visit (HOSPITAL_BASED_OUTPATIENT_CLINIC_OR_DEPARTMENT_OTHER): Payer: Self-pay

## 2021-02-10 ENCOUNTER — Other Ambulatory Visit (HOSPITAL_BASED_OUTPATIENT_CLINIC_OR_DEPARTMENT_OTHER): Payer: Self-pay

## 2021-02-10 DIAGNOSIS — J301 Allergic rhinitis due to pollen: Secondary | ICD-10-CM | POA: Diagnosis not present

## 2021-02-10 DIAGNOSIS — J3089 Other allergic rhinitis: Secondary | ICD-10-CM | POA: Diagnosis not present

## 2021-02-10 MED FILL — Sildenafil Citrate Tab 100 MG: ORAL | 85 days supply | Qty: 17 | Fill #0 | Status: CN

## 2021-02-10 MED FILL — Clomiphene Citrate Tab 50 MG: ORAL | 90 days supply | Qty: 90 | Fill #1 | Status: AC

## 2021-02-17 ENCOUNTER — Other Ambulatory Visit (HOSPITAL_BASED_OUTPATIENT_CLINIC_OR_DEPARTMENT_OTHER): Payer: Self-pay

## 2021-02-17 DIAGNOSIS — J3089 Other allergic rhinitis: Secondary | ICD-10-CM | POA: Diagnosis not present

## 2021-02-17 DIAGNOSIS — J301 Allergic rhinitis due to pollen: Secondary | ICD-10-CM | POA: Diagnosis not present

## 2021-02-20 DIAGNOSIS — J301 Allergic rhinitis due to pollen: Secondary | ICD-10-CM | POA: Diagnosis not present

## 2021-02-20 DIAGNOSIS — J3089 Other allergic rhinitis: Secondary | ICD-10-CM | POA: Diagnosis not present

## 2021-02-27 DIAGNOSIS — J301 Allergic rhinitis due to pollen: Secondary | ICD-10-CM | POA: Diagnosis not present

## 2021-02-27 DIAGNOSIS — J3089 Other allergic rhinitis: Secondary | ICD-10-CM | POA: Diagnosis not present

## 2021-03-03 DIAGNOSIS — E039 Hypothyroidism, unspecified: Secondary | ICD-10-CM | POA: Diagnosis not present

## 2021-03-03 DIAGNOSIS — Z23 Encounter for immunization: Secondary | ICD-10-CM | POA: Diagnosis not present

## 2021-03-03 DIAGNOSIS — I10 Essential (primary) hypertension: Secondary | ICD-10-CM | POA: Diagnosis not present

## 2021-03-03 DIAGNOSIS — K219 Gastro-esophageal reflux disease without esophagitis: Secondary | ICD-10-CM | POA: Diagnosis not present

## 2021-03-03 DIAGNOSIS — G4733 Obstructive sleep apnea (adult) (pediatric): Secondary | ICD-10-CM | POA: Diagnosis not present

## 2021-03-03 DIAGNOSIS — E291 Testicular hypofunction: Secondary | ICD-10-CM | POA: Diagnosis not present

## 2021-03-03 DIAGNOSIS — J4540 Moderate persistent asthma, uncomplicated: Secondary | ICD-10-CM | POA: Diagnosis not present

## 2021-03-03 DIAGNOSIS — J309 Allergic rhinitis, unspecified: Secondary | ICD-10-CM | POA: Diagnosis not present

## 2021-03-03 DIAGNOSIS — F331 Major depressive disorder, recurrent, moderate: Secondary | ICD-10-CM | POA: Diagnosis not present

## 2021-03-03 DIAGNOSIS — G8929 Other chronic pain: Secondary | ICD-10-CM | POA: Diagnosis not present

## 2021-03-06 ENCOUNTER — Other Ambulatory Visit (HOSPITAL_BASED_OUTPATIENT_CLINIC_OR_DEPARTMENT_OTHER): Payer: Self-pay

## 2021-03-06 MED FILL — Fluticasone-Umeclidinium-Vilanterol AEPB 100-62.5-25 MCG/ACT: RESPIRATORY_TRACT | 30 days supply | Qty: 60 | Fill #4 | Status: AC

## 2021-03-10 DIAGNOSIS — J3089 Other allergic rhinitis: Secondary | ICD-10-CM | POA: Diagnosis not present

## 2021-03-10 DIAGNOSIS — J301 Allergic rhinitis due to pollen: Secondary | ICD-10-CM | POA: Diagnosis not present

## 2021-03-15 DIAGNOSIS — G4733 Obstructive sleep apnea (adult) (pediatric): Secondary | ICD-10-CM | POA: Diagnosis not present

## 2021-03-16 DIAGNOSIS — J301 Allergic rhinitis due to pollen: Secondary | ICD-10-CM | POA: Diagnosis not present

## 2021-03-16 DIAGNOSIS — J3089 Other allergic rhinitis: Secondary | ICD-10-CM | POA: Diagnosis not present

## 2021-03-23 DIAGNOSIS — J3089 Other allergic rhinitis: Secondary | ICD-10-CM | POA: Diagnosis not present

## 2021-03-23 DIAGNOSIS — J301 Allergic rhinitis due to pollen: Secondary | ICD-10-CM | POA: Diagnosis not present

## 2021-03-24 ENCOUNTER — Other Ambulatory Visit (HOSPITAL_BASED_OUTPATIENT_CLINIC_OR_DEPARTMENT_OTHER): Payer: Self-pay

## 2021-03-24 MED FILL — Amlodipine Besylate-Valsartan Tab 10-320 MG: ORAL | 90 days supply | Qty: 90 | Fill #1 | Status: AC

## 2021-03-29 DIAGNOSIS — J3089 Other allergic rhinitis: Secondary | ICD-10-CM | POA: Diagnosis not present

## 2021-03-29 DIAGNOSIS — J301 Allergic rhinitis due to pollen: Secondary | ICD-10-CM | POA: Diagnosis not present

## 2021-04-04 DIAGNOSIS — J301 Allergic rhinitis due to pollen: Secondary | ICD-10-CM | POA: Diagnosis not present

## 2021-04-04 DIAGNOSIS — J3089 Other allergic rhinitis: Secondary | ICD-10-CM | POA: Diagnosis not present

## 2021-04-07 ENCOUNTER — Other Ambulatory Visit (HOSPITAL_BASED_OUTPATIENT_CLINIC_OR_DEPARTMENT_OTHER): Payer: Self-pay

## 2021-04-07 ENCOUNTER — Other Ambulatory Visit: Payer: Self-pay

## 2021-04-07 MED ORDER — ALBUTEROL SULFATE HFA 108 (90 BASE) MCG/ACT IN AERS
INHALATION_SPRAY | RESPIRATORY_TRACT | 0 refills | Status: DC
  Filled 2021-04-07: qty 18, 17d supply, fill #0

## 2021-04-07 MED ORDER — TRELEGY ELLIPTA 100-62.5-25 MCG/INH IN AEPB
INHALATION_SPRAY | RESPIRATORY_TRACT | 6 refills | Status: DC
  Filled 2021-04-07: qty 180, 90d supply, fill #0

## 2021-04-07 MED ORDER — FOLIC ACID 1 MG PO TABS
ORAL_TABLET | ORAL | 3 refills | Status: DC
  Filled 2021-04-07: qty 90, 90d supply, fill #0
  Filled 2021-07-19: qty 90, 90d supply, fill #1

## 2021-04-07 MED ORDER — DOXAZOSIN MESYLATE 8 MG PO TABS
ORAL_TABLET | ORAL | 3 refills | Status: DC
  Filled 2021-04-07: qty 90, 90d supply, fill #0
  Filled 2021-07-19: qty 90, 90d supply, fill #1

## 2021-04-07 MED FILL — Levothyroxine Sodium Tab 50 MCG: ORAL | 90 days supply | Qty: 90 | Fill #1 | Status: AC

## 2021-04-07 MED FILL — Montelukast Sodium Tab 10 MG (Base Equiv): ORAL | 90 days supply | Qty: 90 | Fill #1 | Status: AC

## 2021-04-10 ENCOUNTER — Other Ambulatory Visit (HOSPITAL_BASED_OUTPATIENT_CLINIC_OR_DEPARTMENT_OTHER): Payer: Self-pay

## 2021-04-13 DIAGNOSIS — J301 Allergic rhinitis due to pollen: Secondary | ICD-10-CM | POA: Diagnosis not present

## 2021-04-13 DIAGNOSIS — J3089 Other allergic rhinitis: Secondary | ICD-10-CM | POA: Diagnosis not present

## 2021-04-19 DIAGNOSIS — J301 Allergic rhinitis due to pollen: Secondary | ICD-10-CM | POA: Diagnosis not present

## 2021-04-19 DIAGNOSIS — J3089 Other allergic rhinitis: Secondary | ICD-10-CM | POA: Diagnosis not present

## 2021-04-27 DIAGNOSIS — J301 Allergic rhinitis due to pollen: Secondary | ICD-10-CM | POA: Diagnosis not present

## 2021-04-27 DIAGNOSIS — J3089 Other allergic rhinitis: Secondary | ICD-10-CM | POA: Diagnosis not present

## 2021-05-01 ENCOUNTER — Other Ambulatory Visit (HOSPITAL_BASED_OUTPATIENT_CLINIC_OR_DEPARTMENT_OTHER): Payer: Self-pay

## 2021-05-04 DIAGNOSIS — J3089 Other allergic rhinitis: Secondary | ICD-10-CM | POA: Diagnosis not present

## 2021-05-04 DIAGNOSIS — J301 Allergic rhinitis due to pollen: Secondary | ICD-10-CM | POA: Diagnosis not present

## 2021-05-12 DIAGNOSIS — J301 Allergic rhinitis due to pollen: Secondary | ICD-10-CM | POA: Diagnosis not present

## 2021-05-12 DIAGNOSIS — J3089 Other allergic rhinitis: Secondary | ICD-10-CM | POA: Diagnosis not present

## 2021-05-17 DIAGNOSIS — J301 Allergic rhinitis due to pollen: Secondary | ICD-10-CM | POA: Diagnosis not present

## 2021-05-17 DIAGNOSIS — J3089 Other allergic rhinitis: Secondary | ICD-10-CM | POA: Diagnosis not present

## 2021-05-22 DIAGNOSIS — J301 Allergic rhinitis due to pollen: Secondary | ICD-10-CM | POA: Diagnosis not present

## 2021-05-22 DIAGNOSIS — J3089 Other allergic rhinitis: Secondary | ICD-10-CM | POA: Diagnosis not present

## 2021-05-29 ENCOUNTER — Other Ambulatory Visit (HOSPITAL_BASED_OUTPATIENT_CLINIC_OR_DEPARTMENT_OTHER): Payer: Self-pay

## 2021-05-29 MED ORDER — EPINEPHRINE 0.3 MG/0.3ML IJ SOAJ
INTRAMUSCULAR | 1 refills | Status: DC
  Filled 2021-05-29: qty 2, 30d supply, fill #0

## 2021-06-01 DIAGNOSIS — J301 Allergic rhinitis due to pollen: Secondary | ICD-10-CM | POA: Diagnosis not present

## 2021-06-01 DIAGNOSIS — J3089 Other allergic rhinitis: Secondary | ICD-10-CM | POA: Diagnosis not present

## 2021-06-06 DIAGNOSIS — J301 Allergic rhinitis due to pollen: Secondary | ICD-10-CM | POA: Diagnosis not present

## 2021-06-06 DIAGNOSIS — J3089 Other allergic rhinitis: Secondary | ICD-10-CM | POA: Diagnosis not present

## 2021-06-15 DIAGNOSIS — J3089 Other allergic rhinitis: Secondary | ICD-10-CM | POA: Diagnosis not present

## 2021-06-15 DIAGNOSIS — J301 Allergic rhinitis due to pollen: Secondary | ICD-10-CM | POA: Diagnosis not present

## 2021-06-19 ENCOUNTER — Other Ambulatory Visit (HOSPITAL_BASED_OUTPATIENT_CLINIC_OR_DEPARTMENT_OTHER): Payer: Self-pay

## 2021-06-19 DIAGNOSIS — J3089 Other allergic rhinitis: Secondary | ICD-10-CM | POA: Diagnosis not present

## 2021-06-19 DIAGNOSIS — J301 Allergic rhinitis due to pollen: Secondary | ICD-10-CM | POA: Diagnosis not present

## 2021-06-19 DIAGNOSIS — J4540 Moderate persistent asthma, uncomplicated: Secondary | ICD-10-CM | POA: Diagnosis not present

## 2021-06-19 DIAGNOSIS — T781XXD Other adverse food reactions, not elsewhere classified, subsequent encounter: Secondary | ICD-10-CM | POA: Diagnosis not present

## 2021-06-19 MED ORDER — SPIRIVA RESPIMAT 1.25 MCG/ACT IN AERS
INHALATION_SPRAY | RESPIRATORY_TRACT | 6 refills | Status: DC
  Filled 2021-06-19 – 2021-06-28 (×2): qty 4, 30d supply, fill #0

## 2021-06-19 MED ORDER — FLUTICASONE FUROATE-VILANTEROL 200-25 MCG/ACT IN AEPB
INHALATION_SPRAY | RESPIRATORY_TRACT | 6 refills | Status: DC
  Filled 2021-06-19 – 2021-06-28 (×2): qty 60, 30d supply, fill #0

## 2021-06-19 MED ORDER — ALBUTEROL SULFATE HFA 108 (90 BASE) MCG/ACT IN AERS
INHALATION_SPRAY | RESPIRATORY_TRACT | 2 refills | Status: DC
  Filled 2021-06-19 – 2021-06-28 (×2): qty 18, 17d supply, fill #0

## 2021-06-26 ENCOUNTER — Other Ambulatory Visit (HOSPITAL_BASED_OUTPATIENT_CLINIC_OR_DEPARTMENT_OTHER): Payer: Self-pay

## 2021-06-26 MED FILL — Anastrozole Tab 1 MG: ORAL | 90 days supply | Qty: 90 | Fill #1 | Status: AC

## 2021-06-27 ENCOUNTER — Other Ambulatory Visit (HOSPITAL_BASED_OUTPATIENT_CLINIC_OR_DEPARTMENT_OTHER): Payer: Self-pay

## 2021-06-27 MED FILL — Clomiphene Citrate Tab 50 MG: ORAL | 90 days supply | Qty: 90 | Fill #0 | Status: AC

## 2021-06-28 ENCOUNTER — Other Ambulatory Visit (HOSPITAL_BASED_OUTPATIENT_CLINIC_OR_DEPARTMENT_OTHER): Payer: Self-pay

## 2021-07-03 DIAGNOSIS — E291 Testicular hypofunction: Secondary | ICD-10-CM | POA: Diagnosis not present

## 2021-07-03 DIAGNOSIS — J301 Allergic rhinitis due to pollen: Secondary | ICD-10-CM | POA: Diagnosis not present

## 2021-07-03 DIAGNOSIS — J3089 Other allergic rhinitis: Secondary | ICD-10-CM | POA: Diagnosis not present

## 2021-07-07 ENCOUNTER — Other Ambulatory Visit (HOSPITAL_BASED_OUTPATIENT_CLINIC_OR_DEPARTMENT_OTHER): Payer: Self-pay

## 2021-07-12 ENCOUNTER — Other Ambulatory Visit (HOSPITAL_BASED_OUTPATIENT_CLINIC_OR_DEPARTMENT_OTHER): Payer: Self-pay

## 2021-07-12 DIAGNOSIS — G894 Chronic pain syndrome: Secondary | ICD-10-CM | POA: Diagnosis not present

## 2021-07-12 DIAGNOSIS — E039 Hypothyroidism, unspecified: Secondary | ICD-10-CM | POA: Diagnosis not present

## 2021-07-12 DIAGNOSIS — Z23 Encounter for immunization: Secondary | ICD-10-CM | POA: Diagnosis not present

## 2021-07-12 DIAGNOSIS — Z13220 Encounter for screening for lipoid disorders: Secondary | ICD-10-CM | POA: Diagnosis not present

## 2021-07-12 DIAGNOSIS — Z0000 Encounter for general adult medical examination without abnormal findings: Secondary | ICD-10-CM | POA: Diagnosis not present

## 2021-07-12 DIAGNOSIS — Z125 Encounter for screening for malignant neoplasm of prostate: Secondary | ICD-10-CM | POA: Diagnosis not present

## 2021-07-12 DIAGNOSIS — G4733 Obstructive sleep apnea (adult) (pediatric): Secondary | ICD-10-CM | POA: Diagnosis not present

## 2021-07-12 DIAGNOSIS — I10 Essential (primary) hypertension: Secondary | ICD-10-CM | POA: Diagnosis not present

## 2021-07-12 DIAGNOSIS — Z6837 Body mass index (BMI) 37.0-37.9, adult: Secondary | ICD-10-CM | POA: Diagnosis not present

## 2021-07-12 DIAGNOSIS — F331 Major depressive disorder, recurrent, moderate: Secondary | ICD-10-CM | POA: Diagnosis not present

## 2021-07-12 DIAGNOSIS — R0981 Nasal congestion: Secondary | ICD-10-CM | POA: Diagnosis not present

## 2021-07-12 DIAGNOSIS — E291 Testicular hypofunction: Secondary | ICD-10-CM | POA: Diagnosis not present

## 2021-07-12 MED ORDER — FLUTICASONE PROPIONATE 50 MCG/ACT NA SUSP
NASAL | 6 refills | Status: DC
  Filled 2021-07-12: qty 16, 30d supply, fill #0

## 2021-07-12 MED ORDER — ALBUTEROL SULFATE HFA 108 (90 BASE) MCG/ACT IN AERS
INHALATION_SPRAY | RESPIRATORY_TRACT | 11 refills | Status: DC
  Filled 2021-07-12: qty 18, 17d supply, fill #0

## 2021-07-14 DIAGNOSIS — J3089 Other allergic rhinitis: Secondary | ICD-10-CM | POA: Diagnosis not present

## 2021-07-14 DIAGNOSIS — J301 Allergic rhinitis due to pollen: Secondary | ICD-10-CM | POA: Diagnosis not present

## 2021-07-17 ENCOUNTER — Other Ambulatory Visit (HOSPITAL_BASED_OUTPATIENT_CLINIC_OR_DEPARTMENT_OTHER): Payer: Self-pay

## 2021-07-17 DIAGNOSIS — J3089 Other allergic rhinitis: Secondary | ICD-10-CM | POA: Diagnosis not present

## 2021-07-17 DIAGNOSIS — N5201 Erectile dysfunction due to arterial insufficiency: Secondary | ICD-10-CM | POA: Diagnosis not present

## 2021-07-17 DIAGNOSIS — G4733 Obstructive sleep apnea (adult) (pediatric): Secondary | ICD-10-CM | POA: Diagnosis not present

## 2021-07-17 DIAGNOSIS — N200 Calculus of kidney: Secondary | ICD-10-CM | POA: Diagnosis not present

## 2021-07-17 DIAGNOSIS — J301 Allergic rhinitis due to pollen: Secondary | ICD-10-CM | POA: Diagnosis not present

## 2021-07-17 DIAGNOSIS — E291 Testicular hypofunction: Secondary | ICD-10-CM | POA: Diagnosis not present

## 2021-07-17 MED ORDER — CLOMIPHENE CITRATE 50 MG PO TABS
50.0000 mg | ORAL_TABLET | Freq: Every day | ORAL | 3 refills | Status: DC
  Filled 2021-07-17: qty 90, 90d supply, fill #0
  Filled 2021-10-17: qty 30, 30d supply, fill #0

## 2021-07-17 MED ORDER — SILDENAFIL CITRATE 100 MG PO TABS
ORAL_TABLET | ORAL | 5 refills | Status: DC
  Filled 2021-07-17: qty 18, 90d supply, fill #0

## 2021-07-17 MED ORDER — ANASTROZOLE 1 MG PO TABS
1.0000 mg | ORAL_TABLET | Freq: Every day | ORAL | 3 refills | Status: DC
  Filled 2021-07-17 – 2021-10-17 (×2): qty 90, 90d supply, fill #0

## 2021-07-18 DIAGNOSIS — G4733 Obstructive sleep apnea (adult) (pediatric): Secondary | ICD-10-CM | POA: Diagnosis not present

## 2021-07-19 ENCOUNTER — Other Ambulatory Visit: Payer: Self-pay

## 2021-07-19 ENCOUNTER — Other Ambulatory Visit (HOSPITAL_BASED_OUTPATIENT_CLINIC_OR_DEPARTMENT_OTHER): Payer: Self-pay

## 2021-07-19 MED ORDER — LEVOTHYROXINE SODIUM 50 MCG PO TABS
ORAL_TABLET | ORAL | 4 refills | Status: DC
  Filled 2021-07-19: qty 90, 90d supply, fill #0
  Filled 2021-10-16: qty 90, 90d supply, fill #1

## 2021-07-19 MED ORDER — MONTELUKAST SODIUM 10 MG PO TABS
ORAL_TABLET | ORAL | 2 refills | Status: DC
  Filled 2021-07-19: qty 90, 90d supply, fill #0

## 2021-07-19 MED FILL — Amlodipine Besylate-Valsartan Tab 10-320 MG: ORAL | 90 days supply | Qty: 90 | Fill #2 | Status: AC

## 2021-07-20 ENCOUNTER — Other Ambulatory Visit (HOSPITAL_BASED_OUTPATIENT_CLINIC_OR_DEPARTMENT_OTHER): Payer: Self-pay

## 2021-07-21 ENCOUNTER — Other Ambulatory Visit (HOSPITAL_BASED_OUTPATIENT_CLINIC_OR_DEPARTMENT_OTHER): Payer: Self-pay

## 2021-07-21 DIAGNOSIS — J3089 Other allergic rhinitis: Secondary | ICD-10-CM | POA: Diagnosis not present

## 2021-07-21 DIAGNOSIS — J301 Allergic rhinitis due to pollen: Secondary | ICD-10-CM | POA: Diagnosis not present

## 2021-07-21 MED ORDER — TRELEGY ELLIPTA 100-62.5-25 MCG/ACT IN AEPB
INHALATION_SPRAY | RESPIRATORY_TRACT | 6 refills | Status: DC
  Filled 2021-07-21: qty 180, 90d supply, fill #0
  Filled 2021-07-21: qty 60, 30d supply, fill #0
  Filled 2021-12-05: qty 180, 90d supply, fill #0
  Filled 2021-12-05 (×2): qty 60, 30d supply, fill #0
  Filled 2021-12-07: qty 180, 90d supply, fill #0

## 2021-07-27 DIAGNOSIS — J301 Allergic rhinitis due to pollen: Secondary | ICD-10-CM | POA: Diagnosis not present

## 2021-07-27 DIAGNOSIS — J3089 Other allergic rhinitis: Secondary | ICD-10-CM | POA: Diagnosis not present

## 2021-07-28 DIAGNOSIS — J3089 Other allergic rhinitis: Secondary | ICD-10-CM | POA: Diagnosis not present

## 2021-08-01 ENCOUNTER — Other Ambulatory Visit (HOSPITAL_BASED_OUTPATIENT_CLINIC_OR_DEPARTMENT_OTHER): Payer: Self-pay

## 2021-08-01 MED ORDER — OXYCODONE-ACETAMINOPHEN 5-325 MG PO TABS
ORAL_TABLET | ORAL | 0 refills | Status: DC
  Filled 2021-08-01: qty 90, 30d supply, fill #0

## 2021-08-03 DIAGNOSIS — J3089 Other allergic rhinitis: Secondary | ICD-10-CM | POA: Diagnosis not present

## 2021-08-03 DIAGNOSIS — J301 Allergic rhinitis due to pollen: Secondary | ICD-10-CM | POA: Diagnosis not present

## 2021-08-08 DIAGNOSIS — J301 Allergic rhinitis due to pollen: Secondary | ICD-10-CM | POA: Diagnosis not present

## 2021-08-08 DIAGNOSIS — J3089 Other allergic rhinitis: Secondary | ICD-10-CM | POA: Diagnosis not present

## 2021-08-21 DIAGNOSIS — J3089 Other allergic rhinitis: Secondary | ICD-10-CM | POA: Diagnosis not present

## 2021-08-21 DIAGNOSIS — J301 Allergic rhinitis due to pollen: Secondary | ICD-10-CM | POA: Diagnosis not present

## 2021-08-30 ENCOUNTER — Other Ambulatory Visit (HOSPITAL_BASED_OUTPATIENT_CLINIC_OR_DEPARTMENT_OTHER): Payer: Self-pay

## 2021-08-30 DIAGNOSIS — J3089 Other allergic rhinitis: Secondary | ICD-10-CM | POA: Diagnosis not present

## 2021-08-30 DIAGNOSIS — J301 Allergic rhinitis due to pollen: Secondary | ICD-10-CM | POA: Diagnosis not present

## 2021-08-30 MED ORDER — OXYCODONE-ACETAMINOPHEN 5-325 MG PO TABS
1.0000 | ORAL_TABLET | Freq: Three times a day (TID) | ORAL | 0 refills | Status: DC | PRN
  Filled 2021-08-30: qty 90, 30d supply, fill #0

## 2021-08-31 ENCOUNTER — Other Ambulatory Visit (HOSPITAL_BASED_OUTPATIENT_CLINIC_OR_DEPARTMENT_OTHER): Payer: Self-pay

## 2021-09-06 DIAGNOSIS — J3089 Other allergic rhinitis: Secondary | ICD-10-CM | POA: Diagnosis not present

## 2021-09-06 DIAGNOSIS — J301 Allergic rhinitis due to pollen: Secondary | ICD-10-CM | POA: Diagnosis not present

## 2021-09-13 DIAGNOSIS — J3089 Other allergic rhinitis: Secondary | ICD-10-CM | POA: Diagnosis not present

## 2021-09-13 DIAGNOSIS — J301 Allergic rhinitis due to pollen: Secondary | ICD-10-CM | POA: Diagnosis not present

## 2021-09-19 DIAGNOSIS — J3089 Other allergic rhinitis: Secondary | ICD-10-CM | POA: Diagnosis not present

## 2021-09-19 DIAGNOSIS — J301 Allergic rhinitis due to pollen: Secondary | ICD-10-CM | POA: Diagnosis not present

## 2021-09-25 DIAGNOSIS — J3089 Other allergic rhinitis: Secondary | ICD-10-CM | POA: Diagnosis not present

## 2021-09-25 DIAGNOSIS — J301 Allergic rhinitis due to pollen: Secondary | ICD-10-CM | POA: Diagnosis not present

## 2021-10-09 DIAGNOSIS — J301 Allergic rhinitis due to pollen: Secondary | ICD-10-CM | POA: Diagnosis not present

## 2021-10-09 DIAGNOSIS — J3089 Other allergic rhinitis: Secondary | ICD-10-CM | POA: Diagnosis not present

## 2021-10-11 ENCOUNTER — Ambulatory Visit (INDEPENDENT_AMBULATORY_CARE_PROVIDER_SITE_OTHER): Payer: 59

## 2021-10-11 ENCOUNTER — Ambulatory Visit: Payer: 59 | Admitting: Podiatry

## 2021-10-11 ENCOUNTER — Encounter: Payer: Self-pay | Admitting: Podiatry

## 2021-10-11 ENCOUNTER — Other Ambulatory Visit: Payer: Self-pay

## 2021-10-11 DIAGNOSIS — M722 Plantar fascial fibromatosis: Secondary | ICD-10-CM | POA: Diagnosis not present

## 2021-10-11 NOTE — Progress Notes (Signed)
SITUATION ?Reason for Consult: Evaluation for Bilateral Custom Foot Orthoses ?Patient / Caregiver Report: Patient is ready for foot orthotics ? ?OBJECTIVE DATA: ?Patient History / Diagnosis:  ?  ICD-10-CM   ?1. Plantar fasciitis  M72.2   ?  ? ? ?Current or Previous Devices: Current user ? ?Foot Examination: ?Skin presentation:   Intact ?Ulcers & Callousing:   None and no history ?Toe / Foot Deformities:  Pes Planus ?Weight Bearing Presentation:  Planus ?Sensation:    Intact ? ?Shoe size: 11.10M ? ?ORTHOTIC RECOMMENDATION ?Recommended Device: 1x pair of custom functional foot orthotics ? ?GOALS OF ORTHOSES ?- Reduce Pain ?- Prevent Foot Deformity ?- Prevent Progression of Further Foot Deformity ?- Relieve Pressure ?- Improve the Overall Biomechanical Function of the Foot and Lower Extremity. ? ?ACTIONS PERFORMED ?Patient was casted for Foot Orthoses via crush box. Procedure was explained and patient tolerated procedure well. All questions were answered and concerns addressed. ? ?PLAN ?Potential out of pocket cost was communicated to patient. Casts are to be sent to [facility] for fabrication. Patient is to be called for fitting when devices are ready.  ? ? ?

## 2021-10-11 NOTE — Progress Notes (Signed)
Subjective:  ? ?Patient ID: Nicholas Mcintosh, male   DOB: 57 y.o.   MRN: [id number]  ? ?HPI ?Patient states he is doing well but needs new orthotics other than that satisfied with his heels ? ? ?ROS ? ? ?   ?Objective:  ?Physical Exam  ?Doing much better significant reduction of heel pain upon discussion ? ?   ?Assessment:  ?Planter fasciitis improved with orthotics ? ?   ?Plan:  ?Patient is molded for new orthotics by pedorthist and will be seen back when ready and all questions answered ?   ? ? ?

## 2021-10-13 ENCOUNTER — Telehealth: Payer: Self-pay

## 2021-10-13 NOTE — Telephone Encounter (Signed)
Called patient lvm to contact us to schedule appointment to pick up orthotics ?

## 2021-10-16 ENCOUNTER — Other Ambulatory Visit (HOSPITAL_BASED_OUTPATIENT_CLINIC_OR_DEPARTMENT_OTHER): Payer: Self-pay

## 2021-10-16 ENCOUNTER — Other Ambulatory Visit: Payer: Self-pay

## 2021-10-16 DIAGNOSIS — J301 Allergic rhinitis due to pollen: Secondary | ICD-10-CM | POA: Diagnosis not present

## 2021-10-16 DIAGNOSIS — J3089 Other allergic rhinitis: Secondary | ICD-10-CM | POA: Diagnosis not present

## 2021-10-17 ENCOUNTER — Other Ambulatory Visit (HOSPITAL_BASED_OUTPATIENT_CLINIC_OR_DEPARTMENT_OTHER): Payer: Self-pay

## 2021-10-18 ENCOUNTER — Other Ambulatory Visit (HOSPITAL_BASED_OUTPATIENT_CLINIC_OR_DEPARTMENT_OTHER): Payer: Self-pay

## 2021-10-19 ENCOUNTER — Other Ambulatory Visit (HOSPITAL_BASED_OUTPATIENT_CLINIC_OR_DEPARTMENT_OTHER): Payer: Self-pay

## 2021-10-23 DIAGNOSIS — J3089 Other allergic rhinitis: Secondary | ICD-10-CM | POA: Diagnosis not present

## 2021-10-23 DIAGNOSIS — J301 Allergic rhinitis due to pollen: Secondary | ICD-10-CM | POA: Diagnosis not present

## 2021-11-07 DIAGNOSIS — J3089 Other allergic rhinitis: Secondary | ICD-10-CM | POA: Diagnosis not present

## 2021-11-07 DIAGNOSIS — J301 Allergic rhinitis due to pollen: Secondary | ICD-10-CM | POA: Diagnosis not present

## 2021-11-08 DIAGNOSIS — E291 Testicular hypofunction: Secondary | ICD-10-CM | POA: Diagnosis not present

## 2021-11-08 DIAGNOSIS — F331 Major depressive disorder, recurrent, moderate: Secondary | ICD-10-CM | POA: Diagnosis not present

## 2021-11-08 DIAGNOSIS — E039 Hypothyroidism, unspecified: Secondary | ICD-10-CM | POA: Diagnosis not present

## 2021-11-08 DIAGNOSIS — I10 Essential (primary) hypertension: Secondary | ICD-10-CM | POA: Diagnosis not present

## 2021-11-08 DIAGNOSIS — G4733 Obstructive sleep apnea (adult) (pediatric): Secondary | ICD-10-CM | POA: Diagnosis not present

## 2021-11-08 DIAGNOSIS — G894 Chronic pain syndrome: Secondary | ICD-10-CM | POA: Diagnosis not present

## 2021-11-08 DIAGNOSIS — J449 Chronic obstructive pulmonary disease, unspecified: Secondary | ICD-10-CM | POA: Diagnosis not present

## 2021-11-08 DIAGNOSIS — Z6836 Body mass index (BMI) 36.0-36.9, adult: Secondary | ICD-10-CM | POA: Diagnosis not present

## 2021-11-13 ENCOUNTER — Telehealth: Payer: Self-pay | Admitting: Podiatry

## 2021-11-13 DIAGNOSIS — J301 Allergic rhinitis due to pollen: Secondary | ICD-10-CM | POA: Diagnosis not present

## 2021-11-13 DIAGNOSIS — J3081 Allergic rhinitis due to animal (cat) (dog) hair and dander: Secondary | ICD-10-CM | POA: Diagnosis not present

## 2021-11-13 DIAGNOSIS — J3089 Other allergic rhinitis: Secondary | ICD-10-CM | POA: Diagnosis not present

## 2021-11-13 NOTE — Telephone Encounter (Signed)
Left voicemail for patient to pick up orthotics, he needs to make an appt. Balance of 198.16 ?

## 2021-11-22 ENCOUNTER — Other Ambulatory Visit (HOSPITAL_BASED_OUTPATIENT_CLINIC_OR_DEPARTMENT_OTHER): Payer: Self-pay

## 2021-11-23 ENCOUNTER — Other Ambulatory Visit (HOSPITAL_BASED_OUTPATIENT_CLINIC_OR_DEPARTMENT_OTHER): Payer: Self-pay

## 2021-11-24 ENCOUNTER — Ambulatory Visit
Admission: EM | Admit: 2021-11-24 | Discharge: 2021-11-24 | Disposition: A | Discharge status: Home / Self Care | Payer: 59 | Attending: Urgent Care | Admitting: Urgent Care

## 2021-11-24 DIAGNOSIS — J4540 Moderate persistent asthma, uncomplicated: Secondary | ICD-10-CM | POA: Diagnosis not present

## 2021-11-24 DIAGNOSIS — J019 Acute sinusitis, unspecified: Secondary | ICD-10-CM | POA: Diagnosis not present

## 2021-11-24 MED ORDER — LEVOCETIRIZINE DIHYDROCHLORIDE 5 MG PO TABS: 5.0000 mg | ORAL_TABLET | Freq: Every evening | ORAL | 0 refills | Status: DC

## 2021-11-24 MED ORDER — PREDNISONE 20 MG PO TABS: ORAL_TABLET | ORAL | 0 refills | Status: DC

## 2021-11-24 MED ORDER — AMOXICILLIN-POT CLAVULANATE 875-125 MG PO TABS: 1.0000 | ORAL_TABLET | Freq: Two times a day (BID) | ORAL | 0 refills | Status: DC

## 2021-11-24 MED ORDER — METHYLPREDNISOLONE SODIUM SUCC 125 MG IJ SOLR
125.0000 mg | Freq: Once | INTRAMUSCULAR | Status: AC
  Administered 2021-11-24: 125 mg via INTRAMUSCULAR

## 2021-11-24 MED ORDER — PROMETHAZINE-DM 6.25-15 MG/5ML PO SYRP: 5.0000 mL | ORAL_SOLUTION | Freq: Three times a day (TID) | ORAL | 0 refills | Status: DC

## 2021-11-24 NOTE — ED Triage Notes (Signed)
Pt c/o "my lungs feel real tight. The phlegm won't break loose" states he has asthma. Onset ~ 2 weeks ago. States has been using inhaler without relief. Last used ~ 4p today. Associated SOB, "I can't hardly talk."  ?

## 2021-11-24 NOTE — ED Provider Notes (Signed)
?[facility] ? ? ?MRN: [id number] DOB: [birth year] ? ?Subjective:  ? ?Nicholas Mcintosh is a 56 y.o. male presenting for 2-week history of persistent dry hacking cough, chest congestion, sinus congestion, drainage.  Has been using his albuterol inhaler without much relief.  Uses Trelegy daily.  Does not take an antihistamine for his allergic rhinitis.  Patient is not a smoker.  Usually responds well to steroid course with his asthma especially. ? ?No current facility-administered medications for this encounter. ? ?Current Outpatient Medications:  ?  albuterol (PROVENTIL HFA;VENTOLIN HFA) 108 (90 BASE) MCG/ACT inhaler, Inhale 2 puffs into the lungs every 4 (four) hours as needed for wheezing or shortness of breath., Disp: 1 Inhaler, Rfl: 3 ?  albuterol (VENTOLIN HFA) 108 (90 Base) MCG/ACT inhaler, Inhale 2 puffs by mouth into the lungs every 4 hours as needed for cough/wheezing, Disp: 18 g, Rfl: 2 ?  albuterol (VENTOLIN HFA) 108 (90 Base) MCG/ACT inhaler, Inhale 2 puffs by mouth into the lungs every 4 hours as needed for cough/wheezing, Disp: 18 g, Rfl: 11 ?  amLODipine-valsartan (EXFORGE) 10-320 MG tablet, TAKE 1 TABLET BY MOUTH ONCE DAILY, Disp: 30 tablet, Rfl: 11 ?  amLODipine-valsartan (EXFORGE) 5-320 MG per tablet, Take 1 tablet by mouth daily., Disp: 30 tablet, Rfl: 2 ?  amLODipine-valsartan (EXFORGE) 5-320 MG tablet, TAKE 1 TABLET BY MOUTH DAILY, Disp: 90 tablet, Rfl: 3 ?  anastrozole (ARIMIDEX) 1 MG tablet, Take 2 mg by mouth daily. , Disp: , Rfl: 3 ?  anastrozole (ARIMIDEX) 1 MG tablet, Take 1 tablet (1 mg total) by mouth daily., Disp: 90 tablet, Rfl: 3 ?  cefdinir (OMNICEF) 300 MG capsule, Take 1 capsule by mouth twice daily for 10 days, Disp: 20 capsule, Rfl: 0 ?  clomiPHENE (CLOMID) 50 MG tablet, , Disp: , Rfl:  ?  clomiPHENE (CLOMID) 50 MG tablet, Take 1 tablet (50 mg total) by mouth daily., Disp: 90 tablet, Rfl: 3 ?  diclofenac (VOLTAREN) 75 MG EC tablet, , Disp: , Rfl:  ?  doxazosin  (CARDURA) 8 MG tablet, , Disp: , Rfl:  ?  doxazosin (CARDURA) 8 MG tablet, TAKE 1 TABLET BY MOUTH ONCE DAILY, Disp: 90 tablet, Rfl: 2 ?  doxazosin (CARDURA) 8 MG tablet, Take 1 tablet by mouth once daily, Disp: 90 tablet, Rfl: 3 ?  EPINEPHrine 0.3 mg/0.3 mL IJ SOAJ injection, Use as directed Injection as needed for systemic reactions, Disp: 2 each, Rfl: 1 ?  fluticasone (FLONASE) 50 MCG/ACT nasal spray, Place 2 sprays in each nostril Twice a day, Disp: 16 g, Rfl: 6 ?  fluticasone (VERAMYST) 27.5 MCG/SPRAY nasal spray, PLACE 1 - 2 SPRAYS INTO EACH NOSTRIL ONCE DAILY DURING SEASONS OF DIFFICULTY, Disp: 27.3 mL, Rfl: 2 ?  fluticasone furoate-vilanterol (BREO ELLIPTA) 200-25 MCG/ACT AEPB, Inhale 1 puff by mouth into the lungs once daily, Disp: 60 each, Rfl: 6 ?  Fluticasone-Umeclidin-Vilant (TRELEGY ELLIPTA) 100-62.5-25 MCG/ACT AEPB, Inhale 1 puff into the lungs once daily, Disp: 60 each, Rfl: 6 ?  Fluticasone-Umeclidin-Vilant (TRELEGY ELLIPTA) 100-62.5-25 MCG/INH AEPB, Inhale 1 puff into the lungs once daily, Disp: 60 each, Rfl: 6 ?  folic acid (FOLVITE) 1 MG tablet, TAKE 1 TABLET BY MOUTH DAILY., Disp: 30 tablet, Rfl: 3 ?  folic acid (FOLVITE) 1 MG tablet, Take 1 tablet by mouth once daily, Disp: 90 tablet, Rfl: 3 ?  ipratropium-albuterol (DUONEB) 0.5-2.5 (3) MG/3ML SOLN, Take 3 mLs by nebulization every 4 (four) hours as needed., Disp: 75 mL, Rfl: 0 ?  levothyroxine (  SYNTHROID) 50 MCG tablet, TAKE 1 TABLET BY MOUTH ONCE DAILY EVERY MORNING, Disp: 90 tablet, Rfl: 3 ?  levothyroxine (SYNTHROID) 50 MCG tablet, TAKE 1 TABLET BY MOUTH ONCE DAILY EVERY MORNING, Disp: 90 tablet, Rfl: 4 ?  levothyroxine (SYNTHROID, LEVOTHROID) 50 MCG tablet, TAKE 1 TABLET BY MOUTH ONCE DAILY, Disp: 90 tablet, Rfl: 0 ?  montelukast (SINGULAIR) 10 MG tablet, , Disp: , Rfl:  ?  montelukast (SINGULAIR) 10 MG tablet, TAKE 1 TABLET BY MOUTH ONCE DAILY, Disp: 90 tablet, Rfl: 2 ?  montelukast (SINGULAIR) 10 MG tablet, TAKE 1 TABLET BY MOUTH EVERY  EVENING ONCE A DAY, Disp: 90 tablet, Rfl: 0 ?  montelukast (SINGULAIR) 10 MG tablet, TAKE ONE TABLET BY MOUTH EVERY EVENING, Disp: 30 tablet, Rfl: 1 ?  montelukast (SINGULAIR) 10 MG tablet, TAKE 1 TABLET BY MOUTH EVERY EVENING, Disp: 30 tablet, Rfl: 4 ?  montelukast (SINGULAIR) 10 MG tablet, Take 1 tablet by mouth once daily, Disp: 90 tablet, Rfl: 2 ?  oxyCODONE-acetaminophen (PERCOCET/ROXICET) 5-325 MG tablet, Take 1 tablet by mouth 3 (three) times daily. Takes for chronic back pain, Disp: , Rfl: 0 ?  oxyCODONE-acetaminophen (PERCOCET/ROXICET) 5-325 MG tablet, Take 1 tablet by mouth 3 times daily as needed, Disp: 90 tablet, Rfl: 0 ?  sildenafil (VIAGRA) 100 MG tablet, TAKE ONE TABLET BY MOUTH AS NEEDED ONE HOUR PRIOR TO INTERCOURSE, Disp: 20 tablet, Rfl: 5 ?  sildenafil (VIAGRA) 100 MG tablet, Take 1 tablet by mouth one hour prior to intercourse, Disp: 20 tablet, Rfl: 5 ?  tadalafil (CIALIS) 20 MG tablet, TAKE 1 TABLET BY MOUTH AS NEEDED ONE HOUR PRIOR TO INTERCOURSE, Disp: 30 tablet, Rfl: 5 ?  Tiotropium Bromide Monohydrate (SPIRIVA RESPIMAT) 1.25 MCG/ACT AERS, Inhale 2 puffs by mouth into the lungs once daily, Disp: 4 g, Rfl: 6 ?  TRELEGY ELLIPTA 100-62.5-25 MCG/INH AEPB, Inhale 1 puff into the lungs daily., Disp: , Rfl:   ? ?Allergies  ?Allergen Reactions  ? Molds & Smuts   ?  Asthma attacks around molds  ? Dust Mite Extract Other (See Comments)  ?  Ashtma  ? ? ?Past Medical History:  ?Diagnosis Date  ? Chronic back pain   ? hx fall 2006 from 3 stories , vertebral fx  ? Complication of anesthesia   ? ED (erectile dysfunction)   ? Exercise-induced asthma   ? History of concussion   ? 2006 from fall  ? History of kidney stones   ? Hypertension   ? Hypothyroidism   ? Nephrolithiasis   ? bilateral  ? PONV (postoperative nausea and vomiting)   ? Right ureteral stone   ?  ? ?Past Surgical History:  ?Procedure Laterality Date  ? CARDIAC CATHETERIZATION  07-12-2011   dr Sharyn Lull  ? abnormal myoview/  normal coronary  arteries and normal LVSF  ? CARDIOVASCULAR STRESS TEST  06-15-2011   dr Sharyn Lull  ? mild reversible periinfarct anterior wall ischemia, normal wall motion , ef 49%  ? COLONOSCOPY WITH PROPOFOL N/A 12/03/2016  ? Procedure: COLONOSCOPY WITH PROPOFOL;  Surgeon: Charolett Bumpers, MD;  Location: [facility];  Service: Endoscopy;  Laterality: N/A;  ? CYSTOSCOPY WITH HOLMIUM LASER LITHOTRIPSY Left 02/15/2015  ? Procedure: CYSTOSCOPY WITH HOLMIUM LASER LITHOTRIPSY;  Surgeon: Jethro Bolus, MD;  Location: [facility];  Service: Urology;  Laterality: Left;  ? CYSTOSCOPY WITH STENT PLACEMENT Left 02/15/2015  ? Procedure: CYSTOSCOPY WITH STENT PLACEMENT LEFT URETER;  Surgeon: Jethro Bolus, MD;  Location: [facility];  Service: Urology;  Laterality: Left;  ? CYSTOSCOPY/RETROGRADE/URETEROSCOPY/STONE EXTRACTION WITH BASKET Left 02/15/2015  ? Procedure: CYSTOSCOPY/RETROGRADE/URETEROSCOPY/STONE EXTRACTION WITH BASKET;  Surgeon: Jethro BolusSigmund Tannenbaum, MD;  Location: [facility];  Service: Urology;  Laterality: Left;  ? EXTRACORPOREAL SHOCK WAVE LITHOTRIPSY Right 02/03/2018  ? Procedure: RIGHT EXTRACORPOREAL SHOCK WAVE LITHOTRIPSY (ESWL);  Surgeon: Rene PaciWinter, Christopher Aaron, MD;  Location: [facility];  Service: Urology;  Laterality: Right;  ? VARICOCELECTOMY  2001 ?  ? ? ?Family History  ?Problem Relation Age of Onset  ? Hypertension Mother   ? Cancer Maternal Aunt   ?     Breast  ? ? ?Social History  ? ?Tobacco Use  ? Smoking status: Never  ? Smokeless tobacco: Never  ?Vaping Use  ? Vaping Use: Never used  ?Substance Use Topics  ? Alcohol use: No  ?  Alcohol/week: 0.0 standard drinks  ? Drug use: No  ? ? ?ROS ? ? ?Objective:  ? ?Vitals: ?BP (!) 156/83 (BP Location: Left Arm)   Pulse 66   Temp 98.7 ?F (37.1 ?C) (Oral)   Resp 18   SpO2 96%  ? ?Physical Exam ?Constitutional:   ?   General: He is not in acute distress. ?   Appearance: Normal appearance. He is well-developed. He is obese. He is not ill-appearing, toxic-appearing or diaphoretic.   ?HENT:  ?   Head: Normocephalic and atraumatic.  ?   Right Ear: External ear normal.  ?   Left Ear: External ear normal.  ?   Nose: Congestion and rhinorrhea present.  ?   Mouth/Throat:  ?   Mouth: Mucous membranes are mois

## 2021-11-27 ENCOUNTER — Other Ambulatory Visit (HOSPITAL_COMMUNITY): Payer: Self-pay

## 2021-11-27 MED ORDER — OMEPRAZOLE 20 MG PO CPDR
DELAYED_RELEASE_CAPSULE | ORAL | 12 refills | Status: DC
  Filled 2021-11-27: qty 60, 30d supply, fill #0
  Filled 2021-11-28: qty 180, 90d supply, fill #0

## 2021-11-27 MED ORDER — AMLODIPINE BESYLATE-VALSARTAN 10-320 MG PO TABS
ORAL_TABLET | ORAL | 11 refills | Status: DC
  Filled 2021-11-27: qty 30, 30d supply, fill #0
  Filled 2022-02-27: qty 30, 30d supply, fill #1
  Filled 2022-04-04: qty 90, 90d supply, fill #2
  Filled 2022-07-17 – 2022-07-26 (×2): qty 90, 90d supply, fill #3
  Filled 2022-10-16: qty 90, 90d supply, fill #4

## 2021-11-27 MED ORDER — OXYCODONE-ACETAMINOPHEN 5-325 MG PO TABS
ORAL_TABLET | ORAL | 0 refills | Status: DC
  Filled 2021-11-27: qty 90, 30d supply, fill #0

## 2021-11-27 MED ORDER — LEVOTHYROXINE SODIUM 50 MCG PO TABS
ORAL_TABLET | ORAL | 4 refills | Status: DC
  Filled 2021-11-27: qty 90, 90d supply, fill #0
  Filled 2022-04-10: qty 3, 3d supply, fill #1
  Filled 2022-04-12: qty 87, 87d supply, fill #1
  Filled 2022-07-01: qty 90, 90d supply, fill #2
  Filled 2022-10-16: qty 90, 90d supply, fill #3

## 2021-11-28 ENCOUNTER — Other Ambulatory Visit (HOSPITAL_COMMUNITY): Payer: Self-pay

## 2021-12-05 ENCOUNTER — Other Ambulatory Visit: Payer: Self-pay

## 2021-12-05 ENCOUNTER — Other Ambulatory Visit (HOSPITAL_COMMUNITY): Payer: Self-pay

## 2021-12-05 DIAGNOSIS — J3081 Allergic rhinitis due to animal (cat) (dog) hair and dander: Secondary | ICD-10-CM | POA: Diagnosis not present

## 2021-12-05 DIAGNOSIS — J301 Allergic rhinitis due to pollen: Secondary | ICD-10-CM | POA: Diagnosis not present

## 2021-12-05 DIAGNOSIS — J3089 Other allergic rhinitis: Secondary | ICD-10-CM | POA: Diagnosis not present

## 2021-12-05 MED ORDER — FLUTICASONE PROPIONATE 50 MCG/ACT NA SUSP: NASAL | 5 refills | Status: DC

## 2021-12-05 MED ORDER — ANASTROZOLE 1 MG PO TABS
ORAL_TABLET | ORAL | 3 refills | Status: DC
  Filled 2021-12-05: qty 90, 90d supply, fill #0
  Filled 2022-04-10: qty 90, 90d supply, fill #1
  Filled 2022-07-01: qty 90, 90d supply, fill #2

## 2021-12-05 MED ORDER — SILDENAFIL CITRATE 100 MG PO TABS
ORAL_TABLET | ORAL | 12 refills | Status: DC
  Filled 2022-05-15: qty 20, 20d supply, fill #0

## 2021-12-05 MED ORDER — DOXAZOSIN MESYLATE 8 MG PO TABS
ORAL_TABLET | ORAL | 2 refills | Status: DC
  Filled 2022-02-02: qty 90, 90d supply, fill #0

## 2021-12-05 MED ORDER — MONTELUKAST SODIUM 10 MG PO TABS: ORAL_TABLET | ORAL | 1 refills | Status: DC

## 2021-12-05 MED ORDER — AMLODIPINE BESYLATE-VALSARTAN 10-320 MG PO TABS: ORAL_TABLET | ORAL | 11 refills | Status: DC

## 2021-12-05 MED ORDER — ALBUTEROL SULFATE HFA 108 (90 BASE) MCG/ACT IN AERS
INHALATION_SPRAY | RESPIRATORY_TRACT | 2 refills | Status: DC
  Filled 2021-12-07: qty 8.5, 25d supply, fill #0
  Filled 2022-05-15: qty 8.5, 25d supply, fill #1

## 2021-12-05 MED ORDER — LEVOTHYROXINE SODIUM 50 MCG PO TABS: ORAL_TABLET | ORAL | 3 refills | Status: DC

## 2021-12-05 MED ORDER — LEVOCETIRIZINE DIHYDROCHLORIDE 5 MG PO TABS
ORAL_TABLET | ORAL | 0 refills | Status: DC
  Filled 2021-12-05: qty 60, 60d supply, fill #0

## 2021-12-06 ENCOUNTER — Other Ambulatory Visit (HOSPITAL_COMMUNITY): Payer: Self-pay

## 2021-12-06 MED ORDER — CLOMIPHENE CITRATE 50 MG PO TABS
50.0000 mg | ORAL_TABLET | Freq: Every day | ORAL | 0 refills | Status: DC
  Filled 2021-12-06: qty 90, 90d supply, fill #0
  Filled 2022-05-15: qty 90, 90d supply, fill #1

## 2021-12-06 MED ORDER — FOLIC ACID 1 MG PO TABS
1.0000 mg | ORAL_TABLET | Freq: Every day | ORAL | 1 refills | Status: DC
  Filled 2021-12-06: qty 90, 90d supply, fill #0

## 2021-12-06 MED ORDER — FLUTICASONE FUROATE-VILANTEROL 200-25 MCG/ACT IN AEPB
INHALATION_SPRAY | RESPIRATORY_TRACT | 6 refills | Status: DC
  Filled 2021-12-06 (×2): qty 60, 30d supply, fill #0

## 2021-12-06 MED ORDER — TIOTROPIUM BROMIDE MONOHYDRATE 1.25 MCG/ACT IN AERS
INHALATION_SPRAY | Freq: Every day | RESPIRATORY_TRACT | 6 refills | Status: DC
Start: 2021-06-19 — End: 2023-09-13
  Filled 2021-12-06: qty 4, 30d supply, fill #0

## 2021-12-07 ENCOUNTER — Other Ambulatory Visit (HOSPITAL_COMMUNITY): Payer: Self-pay

## 2021-12-07 DIAGNOSIS — G4733 Obstructive sleep apnea (adult) (pediatric): Secondary | ICD-10-CM | POA: Diagnosis not present

## 2021-12-08 ENCOUNTER — Other Ambulatory Visit (HOSPITAL_COMMUNITY): Payer: Self-pay

## 2021-12-11 DIAGNOSIS — J301 Allergic rhinitis due to pollen: Secondary | ICD-10-CM | POA: Diagnosis not present

## 2021-12-11 DIAGNOSIS — J3089 Other allergic rhinitis: Secondary | ICD-10-CM | POA: Diagnosis not present

## 2021-12-12 ENCOUNTER — Other Ambulatory Visit: Payer: 59

## 2021-12-14 ENCOUNTER — Ambulatory Visit: Payer: 59

## 2021-12-14 DIAGNOSIS — M722 Plantar fascial fibromatosis: Secondary | ICD-10-CM

## 2021-12-14 NOTE — Progress Notes (Signed)
SITUATION: Reason for Visit: Fitting and Delivery of Custom Fabricated Foot Orthoses Patient Report: Patient reports comfort and is satisfied with device.  OBJECTIVE DATA: Patient History / Diagnosis:     ICD-10-CM   1. Plantar fasciitis  M72.2       Provided Device:  Custom Functional Foot Orthotics     [facility]: [id number]  GOAL OF ORTHOSIS - Improve gait - Decrease energy expenditure - Improve Balance - Provide Triplanar stability of foot complex - Facilitate motion  ACTIONS PERFORMED Patient was fit with foot orthotics trimmed to shoe last. Patient tolerated fittign procedure.   Patient was provided with verbal and written instruction and demonstration regarding donning, doffing, wear, care, proper fit, function, purpose, cleaning, and use of the orthosis and in all related precautions and risks and benefits regarding the orthosis.  Patient was also provided with verbal instruction regarding how to report any failures or malfunctions of the orthosis and necessary follow up care. Patient was also instructed to contact our office regarding any change in status that may affect the function of the orthosis.  Patient demonstrated independence with proper donning, doffing, and fit and verbalized understanding of all instructions.  PLAN: Patient is to follow up in one week or as necessary (PRN). All questions were answered and concerns addressed. Plan of care was discussed with and agreed upon by the patient.

## 2021-12-18 ENCOUNTER — Other Ambulatory Visit (HOSPITAL_BASED_OUTPATIENT_CLINIC_OR_DEPARTMENT_OTHER): Payer: Self-pay

## 2021-12-18 MED ORDER — CLOMIPHENE CITRATE 50 MG PO TABS
50.0000 mg | ORAL_TABLET | Freq: Every day | ORAL | 3 refills | Status: DC
  Filled 2021-12-18 – 2022-10-16 (×2): qty 90, 90d supply, fill #0

## 2021-12-20 DIAGNOSIS — J3089 Other allergic rhinitis: Secondary | ICD-10-CM | POA: Diagnosis not present

## 2021-12-20 DIAGNOSIS — J301 Allergic rhinitis due to pollen: Secondary | ICD-10-CM | POA: Diagnosis not present

## 2021-12-20 DIAGNOSIS — J3081 Allergic rhinitis due to animal (cat) (dog) hair and dander: Secondary | ICD-10-CM | POA: Diagnosis not present

## 2021-12-26 DIAGNOSIS — J301 Allergic rhinitis due to pollen: Secondary | ICD-10-CM | POA: Diagnosis not present

## 2021-12-26 DIAGNOSIS — J3081 Allergic rhinitis due to animal (cat) (dog) hair and dander: Secondary | ICD-10-CM | POA: Diagnosis not present

## 2021-12-26 DIAGNOSIS — J3089 Other allergic rhinitis: Secondary | ICD-10-CM | POA: Diagnosis not present

## 2021-12-27 DIAGNOSIS — J301 Allergic rhinitis due to pollen: Secondary | ICD-10-CM | POA: Diagnosis not present

## 2021-12-27 DIAGNOSIS — J4540 Moderate persistent asthma, uncomplicated: Secondary | ICD-10-CM | POA: Diagnosis not present

## 2021-12-27 DIAGNOSIS — T781XXD Other adverse food reactions, not elsewhere classified, subsequent encounter: Secondary | ICD-10-CM | POA: Diagnosis not present

## 2021-12-27 DIAGNOSIS — J3089 Other allergic rhinitis: Secondary | ICD-10-CM | POA: Diagnosis not present

## 2021-12-29 ENCOUNTER — Other Ambulatory Visit (HOSPITAL_COMMUNITY): Payer: Self-pay

## 2021-12-29 MED ORDER — OXYCODONE-ACETAMINOPHEN 5-325 MG PO TABS
ORAL_TABLET | ORAL | 0 refills | Status: DC
  Filled 2021-12-29: qty 90, 30d supply, fill #0

## 2022-01-02 DIAGNOSIS — J3089 Other allergic rhinitis: Secondary | ICD-10-CM | POA: Diagnosis not present

## 2022-01-02 DIAGNOSIS — J3081 Allergic rhinitis due to animal (cat) (dog) hair and dander: Secondary | ICD-10-CM | POA: Diagnosis not present

## 2022-01-02 DIAGNOSIS — J301 Allergic rhinitis due to pollen: Secondary | ICD-10-CM | POA: Diagnosis not present

## 2022-01-08 DIAGNOSIS — E291 Testicular hypofunction: Secondary | ICD-10-CM | POA: Diagnosis not present

## 2022-01-18 DIAGNOSIS — J3089 Other allergic rhinitis: Secondary | ICD-10-CM | POA: Diagnosis not present

## 2022-01-18 DIAGNOSIS — J301 Allergic rhinitis due to pollen: Secondary | ICD-10-CM | POA: Diagnosis not present

## 2022-01-18 DIAGNOSIS — J3081 Allergic rhinitis due to animal (cat) (dog) hair and dander: Secondary | ICD-10-CM | POA: Diagnosis not present

## 2022-01-31 DIAGNOSIS — J301 Allergic rhinitis due to pollen: Secondary | ICD-10-CM | POA: Diagnosis not present

## 2022-01-31 DIAGNOSIS — J3089 Other allergic rhinitis: Secondary | ICD-10-CM | POA: Diagnosis not present

## 2022-01-31 DIAGNOSIS — J3081 Allergic rhinitis due to animal (cat) (dog) hair and dander: Secondary | ICD-10-CM | POA: Diagnosis not present

## 2022-02-02 ENCOUNTER — Other Ambulatory Visit (HOSPITAL_COMMUNITY): Payer: Self-pay

## 2022-02-02 MED ORDER — OXYCODONE-ACETAMINOPHEN 5-325 MG PO TABS
ORAL_TABLET | ORAL | 0 refills | Status: DC
  Filled 2022-02-02: qty 90, 30d supply, fill #0

## 2022-02-12 DIAGNOSIS — J3089 Other allergic rhinitis: Secondary | ICD-10-CM | POA: Diagnosis not present

## 2022-02-12 DIAGNOSIS — J301 Allergic rhinitis due to pollen: Secondary | ICD-10-CM | POA: Diagnosis not present

## 2022-02-12 DIAGNOSIS — J3081 Allergic rhinitis due to animal (cat) (dog) hair and dander: Secondary | ICD-10-CM | POA: Diagnosis not present

## 2022-02-28 ENCOUNTER — Other Ambulatory Visit (HOSPITAL_COMMUNITY): Payer: Self-pay

## 2022-03-01 ENCOUNTER — Other Ambulatory Visit (HOSPITAL_COMMUNITY): Payer: Self-pay

## 2022-03-01 DIAGNOSIS — J301 Allergic rhinitis due to pollen: Secondary | ICD-10-CM | POA: Diagnosis not present

## 2022-03-01 DIAGNOSIS — J3089 Other allergic rhinitis: Secondary | ICD-10-CM | POA: Diagnosis not present

## 2022-03-01 MED ORDER — OXYCODONE-ACETAMINOPHEN 5-325 MG PO TABS
ORAL_TABLET | ORAL | 0 refills | Status: DC
  Filled 2022-03-01: qty 90, fill #0
  Filled 2022-03-03: qty 90, 30d supply, fill #0

## 2022-03-02 DIAGNOSIS — J3089 Other allergic rhinitis: Secondary | ICD-10-CM | POA: Diagnosis not present

## 2022-03-03 ENCOUNTER — Other Ambulatory Visit (HOSPITAL_COMMUNITY): Payer: Self-pay

## 2022-03-05 DIAGNOSIS — F331 Major depressive disorder, recurrent, moderate: Secondary | ICD-10-CM | POA: Diagnosis not present

## 2022-03-05 DIAGNOSIS — E039 Hypothyroidism, unspecified: Secondary | ICD-10-CM | POA: Diagnosis not present

## 2022-03-05 DIAGNOSIS — J449 Chronic obstructive pulmonary disease, unspecified: Secondary | ICD-10-CM | POA: Diagnosis not present

## 2022-03-05 DIAGNOSIS — G894 Chronic pain syndrome: Secondary | ICD-10-CM | POA: Diagnosis not present

## 2022-03-05 DIAGNOSIS — G4733 Obstructive sleep apnea (adult) (pediatric): Secondary | ICD-10-CM | POA: Diagnosis not present

## 2022-03-05 DIAGNOSIS — Z6837 Body mass index (BMI) 37.0-37.9, adult: Secondary | ICD-10-CM | POA: Diagnosis not present

## 2022-03-05 DIAGNOSIS — I10 Essential (primary) hypertension: Secondary | ICD-10-CM | POA: Diagnosis not present

## 2022-03-05 DIAGNOSIS — E291 Testicular hypofunction: Secondary | ICD-10-CM | POA: Diagnosis not present

## 2022-03-07 DIAGNOSIS — J3089 Other allergic rhinitis: Secondary | ICD-10-CM | POA: Diagnosis not present

## 2022-03-07 DIAGNOSIS — J3081 Allergic rhinitis due to animal (cat) (dog) hair and dander: Secondary | ICD-10-CM | POA: Diagnosis not present

## 2022-03-07 DIAGNOSIS — J301 Allergic rhinitis due to pollen: Secondary | ICD-10-CM | POA: Diagnosis not present

## 2022-03-18 ENCOUNTER — Other Ambulatory Visit (HOSPITAL_BASED_OUTPATIENT_CLINIC_OR_DEPARTMENT_OTHER): Payer: Self-pay

## 2022-03-19 ENCOUNTER — Other Ambulatory Visit (HOSPITAL_COMMUNITY): Payer: Self-pay

## 2022-03-19 MED ORDER — MONTELUKAST SODIUM 10 MG PO TABS
ORAL_TABLET | ORAL | 1 refills | Status: DC
  Filled 2022-03-19: qty 90, 90d supply, fill #0

## 2022-03-20 DIAGNOSIS — J3081 Allergic rhinitis due to animal (cat) (dog) hair and dander: Secondary | ICD-10-CM | POA: Diagnosis not present

## 2022-03-20 DIAGNOSIS — J301 Allergic rhinitis due to pollen: Secondary | ICD-10-CM | POA: Diagnosis not present

## 2022-03-20 DIAGNOSIS — J3089 Other allergic rhinitis: Secondary | ICD-10-CM | POA: Diagnosis not present

## 2022-03-26 ENCOUNTER — Other Ambulatory Visit (HOSPITAL_BASED_OUTPATIENT_CLINIC_OR_DEPARTMENT_OTHER): Payer: Self-pay

## 2022-03-26 DIAGNOSIS — J301 Allergic rhinitis due to pollen: Secondary | ICD-10-CM | POA: Diagnosis not present

## 2022-03-26 DIAGNOSIS — J3089 Other allergic rhinitis: Secondary | ICD-10-CM | POA: Diagnosis not present

## 2022-03-27 DIAGNOSIS — G4733 Obstructive sleep apnea (adult) (pediatric): Secondary | ICD-10-CM | POA: Diagnosis not present

## 2022-03-31 ENCOUNTER — Other Ambulatory Visit (HOSPITAL_COMMUNITY): Payer: Self-pay

## 2022-03-31 MED ORDER — OXYCODONE-ACETAMINOPHEN 5-325 MG PO TABS
1.0000 | ORAL_TABLET | Freq: Three times a day (TID) | ORAL | 0 refills | Status: DC
  Filled 2022-03-31: qty 90, 30d supply, fill #0

## 2022-04-03 ENCOUNTER — Other Ambulatory Visit (HOSPITAL_COMMUNITY): Payer: Self-pay

## 2022-04-04 ENCOUNTER — Other Ambulatory Visit (HOSPITAL_COMMUNITY): Payer: Self-pay

## 2022-04-05 ENCOUNTER — Other Ambulatory Visit (HOSPITAL_COMMUNITY): Payer: Self-pay

## 2022-04-05 DIAGNOSIS — J3089 Other allergic rhinitis: Secondary | ICD-10-CM | POA: Diagnosis not present

## 2022-04-05 DIAGNOSIS — J301 Allergic rhinitis due to pollen: Secondary | ICD-10-CM | POA: Diagnosis not present

## 2022-04-05 DIAGNOSIS — J3081 Allergic rhinitis due to animal (cat) (dog) hair and dander: Secondary | ICD-10-CM | POA: Diagnosis not present

## 2022-04-10 ENCOUNTER — Other Ambulatory Visit (HOSPITAL_COMMUNITY): Payer: Self-pay

## 2022-04-11 ENCOUNTER — Other Ambulatory Visit (HOSPITAL_COMMUNITY): Payer: Self-pay

## 2022-04-11 MED ORDER — AZITHROMYCIN 250 MG PO TABS
ORAL_TABLET | ORAL | 0 refills | Status: DC
  Filled 2022-04-11: qty 6, 5d supply, fill #0

## 2022-04-12 ENCOUNTER — Other Ambulatory Visit (HOSPITAL_COMMUNITY): Payer: Self-pay

## 2022-04-14 ENCOUNTER — Other Ambulatory Visit (HOSPITAL_COMMUNITY): Payer: Self-pay

## 2022-04-24 DIAGNOSIS — J3089 Other allergic rhinitis: Secondary | ICD-10-CM | POA: Diagnosis not present

## 2022-04-24 DIAGNOSIS — J301 Allergic rhinitis due to pollen: Secondary | ICD-10-CM | POA: Diagnosis not present

## 2022-04-24 DIAGNOSIS — J3081 Allergic rhinitis due to animal (cat) (dog) hair and dander: Secondary | ICD-10-CM | POA: Diagnosis not present

## 2022-05-01 ENCOUNTER — Other Ambulatory Visit (HOSPITAL_COMMUNITY): Payer: Self-pay

## 2022-05-01 DIAGNOSIS — J301 Allergic rhinitis due to pollen: Secondary | ICD-10-CM | POA: Diagnosis not present

## 2022-05-01 DIAGNOSIS — J3081 Allergic rhinitis due to animal (cat) (dog) hair and dander: Secondary | ICD-10-CM | POA: Diagnosis not present

## 2022-05-01 DIAGNOSIS — J3089 Other allergic rhinitis: Secondary | ICD-10-CM | POA: Diagnosis not present

## 2022-05-01 MED ORDER — OXYCODONE-ACETAMINOPHEN 5-325 MG PO TABS
1.0000 | ORAL_TABLET | Freq: Three times a day (TID) | ORAL | 0 refills | Status: DC | PRN
  Filled 2022-05-01: qty 90, 30d supply, fill #0

## 2022-05-15 ENCOUNTER — Other Ambulatory Visit (HOSPITAL_COMMUNITY): Payer: Self-pay

## 2022-05-15 DIAGNOSIS — J3081 Allergic rhinitis due to animal (cat) (dog) hair and dander: Secondary | ICD-10-CM | POA: Diagnosis not present

## 2022-05-15 DIAGNOSIS — J301 Allergic rhinitis due to pollen: Secondary | ICD-10-CM | POA: Diagnosis not present

## 2022-05-15 DIAGNOSIS — J3089 Other allergic rhinitis: Secondary | ICD-10-CM | POA: Diagnosis not present

## 2022-05-16 ENCOUNTER — Other Ambulatory Visit (HOSPITAL_COMMUNITY): Payer: Self-pay

## 2022-05-17 ENCOUNTER — Other Ambulatory Visit (HOSPITAL_COMMUNITY): Payer: Self-pay

## 2022-05-25 ENCOUNTER — Other Ambulatory Visit: Payer: Self-pay | Admitting: Internal Medicine

## 2022-05-28 ENCOUNTER — Other Ambulatory Visit (HOSPITAL_BASED_OUTPATIENT_CLINIC_OR_DEPARTMENT_OTHER): Payer: Self-pay

## 2022-05-29 DIAGNOSIS — J3081 Allergic rhinitis due to animal (cat) (dog) hair and dander: Secondary | ICD-10-CM | POA: Diagnosis not present

## 2022-05-29 DIAGNOSIS — J3089 Other allergic rhinitis: Secondary | ICD-10-CM | POA: Diagnosis not present

## 2022-05-29 DIAGNOSIS — J301 Allergic rhinitis due to pollen: Secondary | ICD-10-CM | POA: Diagnosis not present

## 2022-05-30 ENCOUNTER — Other Ambulatory Visit (HOSPITAL_COMMUNITY): Payer: Self-pay

## 2022-05-30 MED ORDER — OXYCODONE-ACETAMINOPHEN 5-325 MG PO TABS
1.0000 | ORAL_TABLET | Freq: Three times a day (TID) | ORAL | 0 refills | Status: DC | PRN
  Filled 2022-05-30: qty 90, 30d supply, fill #0

## 2022-06-01 DIAGNOSIS — Z23 Encounter for immunization: Secondary | ICD-10-CM | POA: Diagnosis not present

## 2022-06-13 DIAGNOSIS — J3081 Allergic rhinitis due to animal (cat) (dog) hair and dander: Secondary | ICD-10-CM | POA: Diagnosis not present

## 2022-06-13 DIAGNOSIS — J3089 Other allergic rhinitis: Secondary | ICD-10-CM | POA: Diagnosis not present

## 2022-06-13 DIAGNOSIS — J301 Allergic rhinitis due to pollen: Secondary | ICD-10-CM | POA: Diagnosis not present

## 2022-06-26 DIAGNOSIS — J301 Allergic rhinitis due to pollen: Secondary | ICD-10-CM | POA: Diagnosis not present

## 2022-06-26 DIAGNOSIS — J3081 Allergic rhinitis due to animal (cat) (dog) hair and dander: Secondary | ICD-10-CM | POA: Diagnosis not present

## 2022-06-26 DIAGNOSIS — J3089 Other allergic rhinitis: Secondary | ICD-10-CM | POA: Diagnosis not present

## 2022-06-29 ENCOUNTER — Other Ambulatory Visit (HOSPITAL_COMMUNITY): Payer: Self-pay

## 2022-06-29 MED ORDER — OXYCODONE-ACETAMINOPHEN 5-325 MG PO TABS
1.0000 | ORAL_TABLET | Freq: Three times a day (TID) | ORAL | 0 refills | Status: DC | PRN
  Filled 2022-06-29: qty 90, 30d supply, fill #0

## 2022-07-01 ENCOUNTER — Other Ambulatory Visit (HOSPITAL_BASED_OUTPATIENT_CLINIC_OR_DEPARTMENT_OTHER): Payer: Self-pay

## 2022-07-01 ENCOUNTER — Other Ambulatory Visit (HOSPITAL_COMMUNITY): Payer: Self-pay

## 2022-07-02 ENCOUNTER — Other Ambulatory Visit (HOSPITAL_BASED_OUTPATIENT_CLINIC_OR_DEPARTMENT_OTHER): Payer: Self-pay

## 2022-07-02 ENCOUNTER — Other Ambulatory Visit (HOSPITAL_COMMUNITY): Payer: Self-pay

## 2022-07-02 MED ORDER — FOLIC ACID 1 MG PO TABS
1.0000 mg | ORAL_TABLET | Freq: Every day | ORAL | 3 refills | Status: DC
  Filled 2022-07-02: qty 90, 90d supply, fill #0
  Filled 2022-10-16: qty 90, 90d supply, fill #1
  Filled 2023-02-07 (×2): qty 90, 90d supply, fill #2
  Filled 2023-05-13: qty 90, 90d supply, fill #3

## 2022-07-02 MED ORDER — DOXAZOSIN MESYLATE 8 MG PO TABS
8.0000 mg | ORAL_TABLET | Freq: Every day | ORAL | 3 refills | Status: DC
  Filled 2022-07-02: qty 90, 90d supply, fill #0
  Filled 2022-10-16: qty 90, 90d supply, fill #1
  Filled 2023-03-07: qty 90, 90d supply, fill #2

## 2022-07-02 MED ORDER — MONTELUKAST SODIUM 10 MG PO TABS
10.0000 mg | ORAL_TABLET | Freq: Every day | ORAL | 1 refills | Status: DC
  Filled 2022-07-02 (×2): qty 90, 90d supply, fill #0
  Filled 2022-10-16: qty 90, 90d supply, fill #1

## 2022-07-03 DIAGNOSIS — J301 Allergic rhinitis due to pollen: Secondary | ICD-10-CM | POA: Diagnosis not present

## 2022-07-03 DIAGNOSIS — J3081 Allergic rhinitis due to animal (cat) (dog) hair and dander: Secondary | ICD-10-CM | POA: Diagnosis not present

## 2022-07-03 DIAGNOSIS — J3089 Other allergic rhinitis: Secondary | ICD-10-CM | POA: Diagnosis not present

## 2022-07-10 ENCOUNTER — Other Ambulatory Visit (HOSPITAL_COMMUNITY): Payer: Self-pay

## 2022-07-10 DIAGNOSIS — J301 Allergic rhinitis due to pollen: Secondary | ICD-10-CM | POA: Diagnosis not present

## 2022-07-10 DIAGNOSIS — J3089 Other allergic rhinitis: Secondary | ICD-10-CM | POA: Diagnosis not present

## 2022-07-10 DIAGNOSIS — E291 Testicular hypofunction: Secondary | ICD-10-CM | POA: Diagnosis not present

## 2022-07-10 DIAGNOSIS — J3081 Allergic rhinitis due to animal (cat) (dog) hair and dander: Secondary | ICD-10-CM | POA: Diagnosis not present

## 2022-07-20 DIAGNOSIS — J3089 Other allergic rhinitis: Secondary | ICD-10-CM | POA: Diagnosis not present

## 2022-07-20 DIAGNOSIS — J3081 Allergic rhinitis due to animal (cat) (dog) hair and dander: Secondary | ICD-10-CM | POA: Diagnosis not present

## 2022-07-20 DIAGNOSIS — J301 Allergic rhinitis due to pollen: Secondary | ICD-10-CM | POA: Diagnosis not present

## 2022-07-25 ENCOUNTER — Other Ambulatory Visit (HOSPITAL_COMMUNITY): Payer: Self-pay

## 2022-07-26 ENCOUNTER — Other Ambulatory Visit (HOSPITAL_COMMUNITY): Payer: Self-pay

## 2022-07-26 DIAGNOSIS — N200 Calculus of kidney: Secondary | ICD-10-CM | POA: Diagnosis not present

## 2022-07-26 DIAGNOSIS — R972 Elevated prostate specific antigen [PSA]: Secondary | ICD-10-CM | POA: Diagnosis not present

## 2022-07-26 DIAGNOSIS — N400 Enlarged prostate without lower urinary tract symptoms: Secondary | ICD-10-CM | POA: Diagnosis not present

## 2022-07-26 DIAGNOSIS — R8271 Bacteriuria: Secondary | ICD-10-CM | POA: Diagnosis not present

## 2022-07-26 DIAGNOSIS — R1084 Generalized abdominal pain: Secondary | ICD-10-CM | POA: Diagnosis not present

## 2022-07-26 DIAGNOSIS — E291 Testicular hypofunction: Secondary | ICD-10-CM | POA: Diagnosis not present

## 2022-07-26 DIAGNOSIS — K760 Fatty (change of) liver, not elsewhere classified: Secondary | ICD-10-CM | POA: Diagnosis not present

## 2022-07-27 ENCOUNTER — Other Ambulatory Visit (HOSPITAL_COMMUNITY): Payer: Self-pay

## 2022-07-27 DIAGNOSIS — G4733 Obstructive sleep apnea (adult) (pediatric): Secondary | ICD-10-CM | POA: Diagnosis not present

## 2022-07-27 MED ORDER — MELOXICAM 15 MG PO TABS
15.0000 mg | ORAL_TABLET | Freq: Every day | ORAL | 0 refills | Status: DC
  Filled 2022-07-27: qty 10, 10d supply, fill #0

## 2022-07-27 MED ORDER — CIPROFLOXACIN HCL 500 MG PO TABS
500.0000 mg | ORAL_TABLET | Freq: Two times a day (BID) | ORAL | 0 refills | Status: DC
  Filled 2022-07-27: qty 20, 10d supply, fill #0

## 2022-07-28 ENCOUNTER — Other Ambulatory Visit (HOSPITAL_COMMUNITY): Payer: Self-pay

## 2022-07-31 ENCOUNTER — Other Ambulatory Visit (HOSPITAL_COMMUNITY): Payer: Self-pay

## 2022-07-31 DIAGNOSIS — M1990 Unspecified osteoarthritis, unspecified site: Secondary | ICD-10-CM | POA: Diagnosis not present

## 2022-07-31 DIAGNOSIS — J301 Allergic rhinitis due to pollen: Secondary | ICD-10-CM | POA: Diagnosis not present

## 2022-07-31 DIAGNOSIS — R7309 Other abnormal glucose: Secondary | ICD-10-CM | POA: Diagnosis not present

## 2022-07-31 DIAGNOSIS — F331 Major depressive disorder, recurrent, moderate: Secondary | ICD-10-CM | POA: Diagnosis not present

## 2022-07-31 DIAGNOSIS — J449 Chronic obstructive pulmonary disease, unspecified: Secondary | ICD-10-CM | POA: Diagnosis not present

## 2022-07-31 DIAGNOSIS — Z125 Encounter for screening for malignant neoplasm of prostate: Secondary | ICD-10-CM | POA: Diagnosis not present

## 2022-07-31 DIAGNOSIS — G8929 Other chronic pain: Secondary | ICD-10-CM | POA: Diagnosis not present

## 2022-07-31 DIAGNOSIS — J3081 Allergic rhinitis due to animal (cat) (dog) hair and dander: Secondary | ICD-10-CM | POA: Diagnosis not present

## 2022-07-31 DIAGNOSIS — I10 Essential (primary) hypertension: Secondary | ICD-10-CM | POA: Diagnosis not present

## 2022-07-31 DIAGNOSIS — E039 Hypothyroidism, unspecified: Secondary | ICD-10-CM | POA: Diagnosis not present

## 2022-07-31 DIAGNOSIS — G4733 Obstructive sleep apnea (adult) (pediatric): Secondary | ICD-10-CM | POA: Diagnosis not present

## 2022-07-31 DIAGNOSIS — N200 Calculus of kidney: Secondary | ICD-10-CM | POA: Diagnosis not present

## 2022-07-31 DIAGNOSIS — E291 Testicular hypofunction: Secondary | ICD-10-CM | POA: Diagnosis not present

## 2022-07-31 DIAGNOSIS — Z0000 Encounter for general adult medical examination without abnormal findings: Secondary | ICD-10-CM | POA: Diagnosis not present

## 2022-07-31 DIAGNOSIS — J3089 Other allergic rhinitis: Secondary | ICD-10-CM | POA: Diagnosis not present

## 2022-07-31 MED ORDER — OXYCODONE-ACETAMINOPHEN 5-325 MG PO TABS
1.0000 | ORAL_TABLET | Freq: Three times a day (TID) | ORAL | 0 refills | Status: DC | PRN
  Filled 2022-07-31: qty 90, 30d supply, fill #0

## 2022-08-01 ENCOUNTER — Other Ambulatory Visit (HOSPITAL_BASED_OUTPATIENT_CLINIC_OR_DEPARTMENT_OTHER): Payer: Self-pay

## 2022-08-01 DIAGNOSIS — G4733 Obstructive sleep apnea (adult) (pediatric): Secondary | ICD-10-CM | POA: Diagnosis not present

## 2022-08-20 DIAGNOSIS — J301 Allergic rhinitis due to pollen: Secondary | ICD-10-CM | POA: Diagnosis not present

## 2022-08-20 DIAGNOSIS — J3089 Other allergic rhinitis: Secondary | ICD-10-CM | POA: Diagnosis not present

## 2022-08-20 DIAGNOSIS — J3081 Allergic rhinitis due to animal (cat) (dog) hair and dander: Secondary | ICD-10-CM | POA: Diagnosis not present

## 2022-08-28 DIAGNOSIS — R972 Elevated prostate specific antigen [PSA]: Secondary | ICD-10-CM | POA: Diagnosis not present

## 2022-08-31 ENCOUNTER — Other Ambulatory Visit (HOSPITAL_BASED_OUTPATIENT_CLINIC_OR_DEPARTMENT_OTHER): Payer: Self-pay

## 2022-09-03 ENCOUNTER — Other Ambulatory Visit (HOSPITAL_COMMUNITY): Payer: Self-pay

## 2022-09-03 MED ORDER — OXYCODONE-ACETAMINOPHEN 5-325 MG PO TABS
1.0000 | ORAL_TABLET | Freq: Three times a day (TID) | ORAL | 0 refills | Status: DC | PRN
  Filled 2022-09-03 – 2022-09-04 (×2): qty 90, 30d supply, fill #0

## 2022-09-04 ENCOUNTER — Other Ambulatory Visit (HOSPITAL_COMMUNITY): Payer: Self-pay

## 2022-09-04 ENCOUNTER — Other Ambulatory Visit: Payer: Self-pay

## 2022-09-04 DIAGNOSIS — R102 Pelvic and perineal pain: Secondary | ICD-10-CM | POA: Diagnosis not present

## 2022-09-04 DIAGNOSIS — R300 Dysuria: Secondary | ICD-10-CM | POA: Diagnosis not present

## 2022-09-04 DIAGNOSIS — N400 Enlarged prostate without lower urinary tract symptoms: Secondary | ICD-10-CM | POA: Diagnosis not present

## 2022-09-04 MED ORDER — DOXYCYCLINE HYCLATE 100 MG PO TABS
100.0000 mg | ORAL_TABLET | Freq: Two times a day (BID) | ORAL | 0 refills | Status: AC
  Filled 2022-09-04: qty 28, 14d supply, fill #0

## 2022-09-04 MED ORDER — MELOXICAM 15 MG PO TABS
15.0000 mg | ORAL_TABLET | Freq: Every day | ORAL | 0 refills | Status: DC | PRN
  Filled 2022-09-04: qty 20, 20d supply, fill #0

## 2022-09-10 ENCOUNTER — Other Ambulatory Visit: Payer: Commercial Managed Care - PPO

## 2022-09-10 ENCOUNTER — Ambulatory Visit: Payer: Commercial Managed Care - PPO | Admitting: Podiatry

## 2022-09-10 DIAGNOSIS — M722 Plantar fascial fibromatosis: Secondary | ICD-10-CM | POA: Diagnosis not present

## 2022-09-10 NOTE — Progress Notes (Signed)
Subjective:   Patient ID: Nicholas Mcintosh, male   DOB: 57 y.o.   MRN: [id number]   HPI Patient presents with fascial inflammation bilateral and states that it has been doing well but the orthotics really help to control it and he needs a new pair as he is losing some of his support   ROS      Objective:  Physical Exam  Neurovascular status intact moderate depression of the arch with inflammation of the medial band of the plantar fascia bilateral at the insertion tendon calcaneus.  Moderate flatfoot deformity orthotics which have been very good for him     Assessment:  Plantar fasciitis kept under reasonably good control with conservative care with stretching exercises and the wearing of orthotics     Plan:  H&P reviewed and went ahead today and casted for functional orthotic devices and discussed the continued usage of them.  Also can have his other pair redone when these are ready and they fit well.  All questions answered

## 2022-09-11 DIAGNOSIS — J3089 Other allergic rhinitis: Secondary | ICD-10-CM | POA: Diagnosis not present

## 2022-09-11 DIAGNOSIS — J3081 Allergic rhinitis due to animal (cat) (dog) hair and dander: Secondary | ICD-10-CM | POA: Diagnosis not present

## 2022-09-11 DIAGNOSIS — J301 Allergic rhinitis due to pollen: Secondary | ICD-10-CM | POA: Diagnosis not present

## 2022-09-17 DIAGNOSIS — J3089 Other allergic rhinitis: Secondary | ICD-10-CM | POA: Diagnosis not present

## 2022-09-17 DIAGNOSIS — J301 Allergic rhinitis due to pollen: Secondary | ICD-10-CM | POA: Diagnosis not present

## 2022-09-17 DIAGNOSIS — J3081 Allergic rhinitis due to animal (cat) (dog) hair and dander: Secondary | ICD-10-CM | POA: Diagnosis not present

## 2022-09-27 ENCOUNTER — Other Ambulatory Visit (HOSPITAL_COMMUNITY): Payer: Self-pay

## 2022-09-27 DIAGNOSIS — J3089 Other allergic rhinitis: Secondary | ICD-10-CM | POA: Diagnosis not present

## 2022-09-27 DIAGNOSIS — J3081 Allergic rhinitis due to animal (cat) (dog) hair and dander: Secondary | ICD-10-CM | POA: Diagnosis not present

## 2022-09-27 DIAGNOSIS — J4540 Moderate persistent asthma, uncomplicated: Secondary | ICD-10-CM | POA: Diagnosis not present

## 2022-09-27 DIAGNOSIS — J301 Allergic rhinitis due to pollen: Secondary | ICD-10-CM | POA: Diagnosis not present

## 2022-09-27 DIAGNOSIS — T781XXD Other adverse food reactions, not elsewhere classified, subsequent encounter: Secondary | ICD-10-CM | POA: Diagnosis not present

## 2022-09-27 MED ORDER — ALBUTEROL SULFATE HFA 108 (90 BASE) MCG/ACT IN AERS
2.0000 | INHALATION_SPRAY | RESPIRATORY_TRACT | 0 refills | Status: DC | PRN
  Filled 2022-09-27: qty 6.7, 16d supply, fill #0

## 2022-09-27 MED ORDER — EPINEPHRINE 0.3 MG/0.3ML IJ SOAJ
INTRAMUSCULAR | 1 refills | Status: DC
  Filled 2022-09-27: qty 2, 30d supply, fill #0

## 2022-09-27 MED ORDER — MONTELUKAST SODIUM 10 MG PO TABS
10.0000 mg | ORAL_TABLET | Freq: Every day | ORAL | 3 refills | Status: DC
  Filled 2022-09-27: qty 90, 90d supply, fill #0

## 2022-09-27 MED ORDER — TRELEGY ELLIPTA 100-62.5-25 MCG/ACT IN AEPB
1.0000 | INHALATION_SPRAY | Freq: Every day | RESPIRATORY_TRACT | 3 refills | Status: DC
  Filled 2022-09-27 – 2022-10-20 (×2): qty 60, 30d supply, fill #0
  Filled 2022-11-13: qty 60, 30d supply, fill #1

## 2022-10-04 ENCOUNTER — Other Ambulatory Visit (HOSPITAL_COMMUNITY): Payer: Self-pay

## 2022-10-04 MED ORDER — OXYCODONE-ACETAMINOPHEN 5-325 MG PO TABS
1.0000 | ORAL_TABLET | Freq: Three times a day (TID) | ORAL | 0 refills | Status: DC | PRN
  Filled 2022-10-04: qty 90, 30d supply, fill #0

## 2022-10-05 ENCOUNTER — Other Ambulatory Visit (HOSPITAL_COMMUNITY): Payer: Self-pay

## 2022-10-15 DIAGNOSIS — J3081 Allergic rhinitis due to animal (cat) (dog) hair and dander: Secondary | ICD-10-CM | POA: Diagnosis not present

## 2022-10-15 DIAGNOSIS — J3089 Other allergic rhinitis: Secondary | ICD-10-CM | POA: Diagnosis not present

## 2022-10-15 DIAGNOSIS — J301 Allergic rhinitis due to pollen: Secondary | ICD-10-CM | POA: Diagnosis not present

## 2022-10-16 ENCOUNTER — Other Ambulatory Visit: Payer: Self-pay

## 2022-10-16 ENCOUNTER — Other Ambulatory Visit (HOSPITAL_COMMUNITY): Payer: Self-pay

## 2022-10-16 MED ORDER — ANASTROZOLE 1 MG PO TABS
ORAL_TABLET | ORAL | 3 refills | Status: AC
  Filled 2022-10-16: qty 90, 90d supply, fill #0
  Filled 2023-02-28: qty 90, 90d supply, fill #1
  Filled 2023-08-01: qty 90, 90d supply, fill #2

## 2022-10-20 ENCOUNTER — Other Ambulatory Visit (HOSPITAL_COMMUNITY): Payer: Self-pay

## 2022-10-30 ENCOUNTER — Other Ambulatory Visit (HOSPITAL_COMMUNITY): Payer: Self-pay

## 2022-10-30 DIAGNOSIS — J3089 Other allergic rhinitis: Secondary | ICD-10-CM | POA: Diagnosis not present

## 2022-10-30 DIAGNOSIS — J3081 Allergic rhinitis due to animal (cat) (dog) hair and dander: Secondary | ICD-10-CM | POA: Diagnosis not present

## 2022-10-30 DIAGNOSIS — J301 Allergic rhinitis due to pollen: Secondary | ICD-10-CM | POA: Diagnosis not present

## 2022-10-30 MED ORDER — EPINEPHRINE 0.3 MG/0.3ML IJ SOAJ
INTRAMUSCULAR | 1 refills | Status: AC
  Filled 2022-10-30: qty 2, 30d supply, fill #0

## 2022-10-30 MED ORDER — OXYCODONE-ACETAMINOPHEN 5-325 MG PO TABS
1.0000 | ORAL_TABLET | Freq: Three times a day (TID) | ORAL | 0 refills | Status: DC | PRN
  Filled 2022-11-03: qty 90, 30d supply, fill #0

## 2022-10-31 ENCOUNTER — Other Ambulatory Visit (HOSPITAL_COMMUNITY): Payer: Self-pay

## 2022-10-31 MED ORDER — ALBUTEROL SULFATE HFA 108 (90 BASE) MCG/ACT IN AERS
INHALATION_SPRAY | RESPIRATORY_TRACT | 2 refills | Status: DC
  Filled 2022-10-31: qty 6.7, 17d supply, fill #0
  Filled 2023-04-13: qty 6.7, 17d supply, fill #1

## 2022-11-03 ENCOUNTER — Other Ambulatory Visit (HOSPITAL_COMMUNITY): Payer: Self-pay

## 2022-11-12 DIAGNOSIS — J301 Allergic rhinitis due to pollen: Secondary | ICD-10-CM | POA: Diagnosis not present

## 2022-11-12 DIAGNOSIS — J3081 Allergic rhinitis due to animal (cat) (dog) hair and dander: Secondary | ICD-10-CM | POA: Diagnosis not present

## 2022-11-12 DIAGNOSIS — J3089 Other allergic rhinitis: Secondary | ICD-10-CM | POA: Diagnosis not present

## 2022-11-21 DIAGNOSIS — G4733 Obstructive sleep apnea (adult) (pediatric): Secondary | ICD-10-CM | POA: Diagnosis not present

## 2022-11-22 ENCOUNTER — Other Ambulatory Visit (HOSPITAL_COMMUNITY): Payer: Self-pay

## 2022-11-26 DIAGNOSIS — E291 Testicular hypofunction: Secondary | ICD-10-CM | POA: Diagnosis not present

## 2022-11-26 DIAGNOSIS — J3089 Other allergic rhinitis: Secondary | ICD-10-CM | POA: Diagnosis not present

## 2022-11-26 DIAGNOSIS — I10 Essential (primary) hypertension: Secondary | ICD-10-CM | POA: Diagnosis not present

## 2022-11-26 DIAGNOSIS — J3081 Allergic rhinitis due to animal (cat) (dog) hair and dander: Secondary | ICD-10-CM | POA: Diagnosis not present

## 2022-11-26 DIAGNOSIS — G894 Chronic pain syndrome: Secondary | ICD-10-CM | POA: Diagnosis not present

## 2022-11-26 DIAGNOSIS — K219 Gastro-esophageal reflux disease without esophagitis: Secondary | ICD-10-CM | POA: Diagnosis not present

## 2022-11-26 DIAGNOSIS — Z6837 Body mass index (BMI) 37.0-37.9, adult: Secondary | ICD-10-CM | POA: Diagnosis not present

## 2022-11-26 DIAGNOSIS — E039 Hypothyroidism, unspecified: Secondary | ICD-10-CM | POA: Diagnosis not present

## 2022-11-26 DIAGNOSIS — J301 Allergic rhinitis due to pollen: Secondary | ICD-10-CM | POA: Diagnosis not present

## 2022-11-26 DIAGNOSIS — J4540 Moderate persistent asthma, uncomplicated: Secondary | ICD-10-CM | POA: Diagnosis not present

## 2022-11-26 DIAGNOSIS — F324 Major depressive disorder, single episode, in partial remission: Secondary | ICD-10-CM | POA: Diagnosis not present

## 2022-11-26 DIAGNOSIS — G4733 Obstructive sleep apnea (adult) (pediatric): Secondary | ICD-10-CM | POA: Diagnosis not present

## 2022-12-03 ENCOUNTER — Other Ambulatory Visit (HOSPITAL_COMMUNITY): Payer: Self-pay

## 2022-12-03 MED ORDER — OXYCODONE-ACETAMINOPHEN 5-325 MG PO TABS
1.0000 | ORAL_TABLET | Freq: Three times a day (TID) | ORAL | 0 refills | Status: DC | PRN
  Filled 2022-12-03: qty 90, 30d supply, fill #0

## 2022-12-05 DIAGNOSIS — J3081 Allergic rhinitis due to animal (cat) (dog) hair and dander: Secondary | ICD-10-CM | POA: Diagnosis not present

## 2022-12-05 DIAGNOSIS — J301 Allergic rhinitis due to pollen: Secondary | ICD-10-CM | POA: Diagnosis not present

## 2022-12-05 DIAGNOSIS — J3089 Other allergic rhinitis: Secondary | ICD-10-CM | POA: Diagnosis not present

## 2022-12-11 DIAGNOSIS — J301 Allergic rhinitis due to pollen: Secondary | ICD-10-CM | POA: Diagnosis not present

## 2022-12-11 DIAGNOSIS — J3081 Allergic rhinitis due to animal (cat) (dog) hair and dander: Secondary | ICD-10-CM | POA: Diagnosis not present

## 2022-12-11 DIAGNOSIS — J3089 Other allergic rhinitis: Secondary | ICD-10-CM | POA: Diagnosis not present

## 2022-12-17 DIAGNOSIS — J301 Allergic rhinitis due to pollen: Secondary | ICD-10-CM | POA: Diagnosis not present

## 2022-12-17 DIAGNOSIS — J3089 Other allergic rhinitis: Secondary | ICD-10-CM | POA: Diagnosis not present

## 2022-12-17 DIAGNOSIS — J3081 Allergic rhinitis due to animal (cat) (dog) hair and dander: Secondary | ICD-10-CM | POA: Diagnosis not present

## 2022-12-18 DIAGNOSIS — J3089 Other allergic rhinitis: Secondary | ICD-10-CM | POA: Diagnosis not present

## 2022-12-21 ENCOUNTER — Other Ambulatory Visit (HOSPITAL_COMMUNITY): Payer: Self-pay

## 2022-12-21 DIAGNOSIS — G4733 Obstructive sleep apnea (adult) (pediatric): Secondary | ICD-10-CM | POA: Diagnosis not present

## 2022-12-21 MED ORDER — AMOXICILLIN 500 MG PO CAPS
500.0000 mg | ORAL_CAPSULE | Freq: Three times a day (TID) | ORAL | 0 refills | Status: DC
  Filled 2022-12-21: qty 21, 7d supply, fill #0

## 2022-12-25 ENCOUNTER — Ambulatory Visit
Admission: EM | Admit: 2022-12-25 | Discharge: 2022-12-25 | Disposition: A | Discharge status: Home / Self Care | Payer: Commercial Managed Care - PPO | Attending: Emergency Medicine | Admitting: Emergency Medicine

## 2022-12-25 ENCOUNTER — Encounter: Payer: Self-pay | Admitting: Emergency Medicine

## 2022-12-25 ENCOUNTER — Other Ambulatory Visit: Payer: Self-pay

## 2022-12-25 ENCOUNTER — Other Ambulatory Visit (HOSPITAL_COMMUNITY): Payer: Self-pay

## 2022-12-25 DIAGNOSIS — J45901 Unspecified asthma with (acute) exacerbation: Secondary | ICD-10-CM | POA: Diagnosis not present

## 2022-12-25 MED ORDER — METHYLPREDNISOLONE ACETATE 80 MG/ML IJ SUSP
80.0000 mg | Freq: Once | INTRAMUSCULAR | Status: AC
  Administered 2022-12-25: 80 mg via INTRAMUSCULAR

## 2022-12-25 MED ORDER — PREDNISONE 20 MG PO TABS
ORAL_TABLET | ORAL | 0 refills | Status: DC
  Filled 2022-12-25: qty 10, 5d supply, fill #0

## 2022-12-25 MED ORDER — IPRATROPIUM-ALBUTEROL 0.5-2.5 (3) MG/3ML IN SOLN
3.0000 mL | Freq: Once | RESPIRATORY_TRACT | Status: AC
  Administered 2022-12-25: 3 mL via RESPIRATORY_TRACT

## 2022-12-25 MED ORDER — ALBUTEROL SULFATE (2.5 MG/3ML) 0.083% IN NEBU
2.5000 mg | INHALATION_SOLUTION | Freq: Once | RESPIRATORY_TRACT | Status: AC
  Administered 2022-12-25: 2.5 mg via RESPIRATORY_TRACT

## 2022-12-25 MED ORDER — ALBUTEROL SULFATE (2.5 MG/3ML) 0.083% IN NEBU
2.5000 mg | INHALATION_SOLUTION | Freq: Four times a day (QID) | RESPIRATORY_TRACT | 1 refills | Status: AC | PRN
  Filled 2022-12-25: qty 90, 8d supply, fill #0

## 2022-12-25 NOTE — ED Triage Notes (Signed)
Pt here for asthma sx x 1 week; pt seen here for same in past and given meds that helped

## 2022-12-25 NOTE — ED Provider Notes (Signed)
[facility]    CSN: [id number] Arrival date & time: 12/25/22  1203      History   Chief Complaint Chief Complaint  Patient presents with   Asthma    HPI Nicholas Mcintosh is a 57 y.o. male. Reports asthma exacerbation for about the last week. Prn albuterol not helping, has stopped using. Still using all asthma and allergy medicines as prescribed - trelegy ellipta, nasal spray, xyzal, singulair. Does not feel ill - feels like asthma exacerbation   Asthma    Past Medical History:  Diagnosis Date   Chronic back pain    hx fall 2006 from 3 stories , vertebral fx   Complication of anesthesia    ED (erectile dysfunction)    Exercise-induced asthma    History of concussion    2006 from fall   History of kidney stones    Hypertension    Hypothyroidism    Nephrolithiasis    bilateral   PONV (postoperative nausea and vomiting)    Right ureteral stone     Patient Active Problem List   Diagnosis Date Noted   Kidney stone 10/29/2019   Chronic back pain 01/03/2015   Metabolic syndrome 08/12/2014   Obesity 08/12/2014   Muscle spasm of back 08/12/2014   Vitamin D deficiency 08/12/2014   High serum adrenocorticotropic hormone (ACTH) 07/08/2014   Chronic pain syndrome 04/15/2014   Annual physical exam 04/15/2014   Muscle spasms of both lower extremities 04/12/2014   Elbow pain, right 09/25/2013   Gynecomastia, male 04/08/2013   HTN (hypertension) 01/05/2013   Testosterone insufficiency 01/05/2013   Unspecified hypothyroidism 01/05/2013   Erectile dysfunction 01/05/2013   Back pain 01/05/2013   Other malaise and fatigue 01/05/2013   BPH (benign prostatic hyperplasia) 01/05/2013   Generalized anxiety disorder 01/05/2013   Scrotal injury 01/05/2013   New-onset angina (HCC) 07/12/2011    Past Surgical History:  Procedure Laterality Date   CARDIAC CATHETERIZATION  07-12-2011   dr Sharyn Lull   abnormal myoview/  normal coronary arteries and normal LVSF    CARDIOVASCULAR STRESS TEST  06-15-2011   dr Sharyn Lull   mild reversible periinfarct anterior wall ischemia, normal wall motion , ef 49%   COLONOSCOPY WITH PROPOFOL N/A 12/03/2016   Procedure: COLONOSCOPY WITH PROPOFOL;  Surgeon: Charolett Bumpers, MD;  Location: [facility];  Service: Endoscopy;  Laterality: N/A;   CYSTOSCOPY WITH HOLMIUM LASER LITHOTRIPSY Left 02/15/2015   Procedure: CYSTOSCOPY WITH HOLMIUM LASER LITHOTRIPSY;  Surgeon: Jethro Bolus, MD;  Location: [facility];  Service: Urology;  Laterality: Left;   CYSTOSCOPY WITH STENT PLACEMENT Left 02/15/2015   Procedure: CYSTOSCOPY WITH STENT PLACEMENT LEFT URETER;  Surgeon: Jethro Bolus, MD;  Location: [facility];  Service: Urology;  Laterality: Left;   CYSTOSCOPY/RETROGRADE/URETEROSCOPY/STONE EXTRACTION WITH BASKET Left 02/15/2015   Procedure: CYSTOSCOPY/RETROGRADE/URETEROSCOPY/STONE EXTRACTION WITH BASKET;  Surgeon: Jethro Bolus, MD;  Location: [facility];  Service: Urology;  Laterality: Left;   EXTRACORPOREAL SHOCK WAVE LITHOTRIPSY Right 02/03/2018   Procedure: RIGHT EXTRACORPOREAL SHOCK WAVE LITHOTRIPSY (ESWL);  Surgeon: Rene Paci, MD;  Location: [facility];  Service: Urology;  Laterality: Right;   VARICOCELECTOMY  2001 ?       Home Medications    Prior to Admission medications   Medication Sig Start Date End Date Taking? Authorizing Provider  albuterol (PROVENTIL) (2.5 MG/3ML) 0.083% nebulizer solution Take 3 mLs (2.5 mg total) by nebulization every 6 (six) hours as needed for wheezing or shortness of breath. 12/25/22  Yes Cathlyn Parsons, NP  albuterol (PROVENTIL HFA;VENTOLIN HFA) 108 (90 BASE) MCG/ACT inhaler Inhale 2 puffs into the lungs every 4 (four) hours as needed for wheezing or shortness of breath. 05/01/14   Cathren Laine, MD  albuterol (VENTOLIN HFA) 108 (90 Base) MCG/ACT inhaler Inhale 2 puffs by mouth into the lungs every 4 hours as needed for cough/wheezing 06/19/21     albuterol (VENTOLIN HFA) 108 (90 Base)  MCG/ACT inhaler Inhale 2 puffs by mouth into the lungs every 4 hours as needed for cough/wheezing 07/12/21     albuterol (VENTOLIN HFA) 108 (90 Base) MCG/ACT inhaler Inhale 2 puffs into the lungs every 4 (four) hours as needed for cough/wheeze 09/27/22     albuterol (VENTOLIN HFA) 108 (90 Base) MCG/ACT inhaler Inhale 2 puffs into the lungs every 4 hours as needed for cough/wheeze 10/31/22     amLODipine-valsartan (EXFORGE) 10-320 MG tablet TAKE 1 TABLET BY MOUTH ONCE DAILY 09/09/20 09/09/21  Georgann Housekeeper, MD  amLODipine-valsartan (EXFORGE) 10-320 MG tablet Take 1 tablet by mouth once a day 11/27/21     amLODipine-valsartan (EXFORGE) 10-320 MG tablet Take 1 tablet by mouth once daily 10/21/21     amLODipine-valsartan (EXFORGE) 5-320 MG per tablet Take 1 tablet by mouth daily. 02/10/15   Henrietta Hoover, NP  amLODipine-valsartan (EXFORGE) 5-320 MG tablet TAKE 1 TABLET BY MOUTH DAILY 01/11/20 01/10/21  Georgann Housekeeper, MD  amoxicillin (AMOXIL) 500 MG capsule Take 1 capsule (500 mg total) by mouth 3 (three) times daily for 7 days. 12/21/22     amoxicillin-clavulanate (AUGMENTIN) 875-125 MG tablet Take 1 tablet by mouth 2 (two) times daily. 11/24/21   Wallis Bamberg, PA-C  anastrozole (ARIMIDEX) 1 MG tablet Take 2 mg by mouth daily.  08/09/16   [provider]  anastrozole (ARIMIDEX) 1 MG tablet Take 1 tablet (1 mg total) by mouth daily. 07/17/21     anastrozole (ARIMIDEX) 1 MG tablet Take 1 tablet by mouth every day. 10/16/22     azithromycin (ZITHROMAX) 250 MG tablet Take 2 tablets the first day then 1 tablet every day for 4 days Patient not taking: Reported on 12/25/2022 04/11/22     ciprofloxacin (CIPRO) 500 MG tablet Take 1 tablet (500 mg total) by mouth 2 (two) times daily. Patient not taking: Reported on 12/25/2022 07/27/22     clomiPHENE (CLOMID) 50 MG tablet  02/04/19   [provider]  clomiPHENE (CLOMID) 50 MG tablet Take 1 tablet (50 mg total) by mouth daily. 07/17/21     clomiPHENE (CLOMID)  50 MG tablet Take 1 tablet (50 mg total) by mouth daily. 07/17/21     clomiPHENE (CLOMID) 50 MG tablet Take 1 tablet (50 mg total) by mouth daily. 12/18/21     diclofenac (VOLTAREN) 75 MG EC tablet  01/01/19   [provider]  doxazosin (CARDURA) 8 MG tablet  02/04/19   [provider]  doxazosin (CARDURA) 8 MG tablet TAKE 1 TABLET BY MOUTH ONCE DAILY 05/30/20 05/30/21  Georgann Housekeeper, MD  doxazosin (CARDURA) 8 MG tablet Take 1 tablet by mouth once daily 04/07/21     doxazosin (CARDURA) 8 MG tablet Take 1 tablet (8 mg total) by mouth daily. 07/02/22     doxycycline (VIBRA-TABS) 100 MG tablet Take 1 tablet (100 mg total) by mouth 2 (two) times daily. Patient not taking: Reported on 12/25/2022 09/04/22     EPINEPHrine 0.3 mg/0.3 mL IJ SOAJ injection Use as directed Injection as needed for systemic reactions 05/29/21     EPINEPHrine 0.3 mg/0.3  mL IJ SOAJ injection Inject as directed as needed for systemic reaction 09/27/22     EPINEPHrine 0.3 mg/0.3 mL IJ SOAJ injection Inject as directed as needed for systemic reaction 10/30/22     fluticasone (FLONASE) 50 MCG/ACT nasal spray Place 2 sprays in each nostril Twice a day 07/12/21     fluticasone (FLONASE) 50 MCG/ACT nasal spray Spray into both nostrils twice daily. 07/12/21     fluticasone (VERAMYST) 27.5 MCG/SPRAY nasal spray PLACE 1 - 2 SPRAYS INTO EACH NOSTRIL ONCE DAILY DURING SEASONS OF DIFFICULTY 08/30/20 08/30/21  Irena Cords, Enzo Montgomery, MD  fluticasone furoate-vilanterol (BREO ELLIPTA) 200-25 MCG/ACT AEPB Inhale 1 pufff once daily into the lungs. 06/19/21     fluticasone furoate-vilanterol (BREO ELLIPTA) 200-25 MCG/ACT AEPB Inhale 1 puff by mouth into the lungs once daily 06/19/21     Fluticasone-Umeclidin-Vilant (TRELEGY ELLIPTA) 100-62.5-25 MCG/ACT AEPB Inhale 1 puff into the lungs once daily 07/21/21     Fluticasone-Umeclidin-Vilant (TRELEGY ELLIPTA) 100-62.5-25 MCG/ACT AEPB Inhale 1 puff into the lungs daily. 09/27/22      Fluticasone-Umeclidin-Vilant (TRELEGY ELLIPTA) 100-62.5-25 MCG/INH AEPB Inhale 1 puff into the lungs once daily 04/07/21     folic acid (FOLVITE) 1 MG tablet Take 1 tablet by mouth once daily 04/07/21     folic acid (FOLVITE) 1 MG tablet Take 1 tablet (1 mg total) by mouth daily. 07/02/22     ipratropium-albuterol (DUONEB) 0.5-2.5 (3) MG/3ML SOLN Take 3 mLs by nebulization every 4 (four) hours as needed. 03/03/20   Darr, Gerilyn Pilgrim, PA-C  levocetirizine (XYZAL) 5 MG tablet Take 1 tablet (5 mg total) by mouth every evening. 11/24/21   Wallis Bamberg, PA-C  levocetirizine (XYZAL) 5 MG tablet Take 1 tablet by mouth once daily in the evening 11/24/21   Wallis Bamberg, PA-C  levothyroxine (SYNTHROID) 50 MCG tablet TAKE 1 TABLET BY MOUTH ONCE DAILY EVERY MORNING 04/28/20 04/28/21  Georgann Housekeeper, MD  levothyroxine (SYNTHROID) 50 MCG tablet TAKE 1 TABLET BY MOUTH ONCE DAILY EVERY MORNING 07/19/21     levothyroxine (SYNTHROID) 50 MCG tablet TAKE 1 TABLET BY MOUTH ONCE DAILY EVERY MORNING 11/27/21     levothyroxine (SYNTHROID) 50 MCG tablet Take 1 tablet by mouth every morning. 10/21/21     levothyroxine (SYNTHROID, LEVOTHROID) 50 MCG tablet TAKE 1 TABLET BY MOUTH ONCE DAILY 05/13/15   Henrietta Hoover, NP  meloxicam (MOBIC) 15 MG tablet Take 1 tablet (15 mg total) by mouth daily. 07/27/22     meloxicam (MOBIC) 15 MG tablet Take 1 tablet (15 mg total) by mouth daily as needed. 09/04/22     montelukast (SINGULAIR) 10 MG tablet  02/04/19   [provider]  montelukast (SINGULAIR) 10 MG tablet TAKE 1 TABLET BY MOUTH ONCE DAILY 08/30/20 08/30/21  Irena Cords, Enzo Montgomery, MD  montelukast (SINGULAIR) 10 MG tablet TAKE 1 TABLET BY MOUTH EVERY EVENING ONCE A DAY 08/17/20 08/17/21  Irena Cords, Enzo Montgomery, MD  montelukast (SINGULAIR) 10 MG tablet TAKE ONE TABLET BY MOUTH EVERY EVENING 08/10/20 08/10/21  Irena Cords, Enzo Montgomery, MD  montelukast (SINGULAIR) 10 MG tablet TAKE 1 TABLET BY MOUTH EVERY EVENING 01/04/20 01/03/21  Irena Cords, Enzo Montgomery, MD   montelukast (SINGULAIR) 10 MG tablet Take 1 tablet by mouth once daily 07/19/21     montelukast (SINGULAIR) 10 MG tablet Take 1 tablet by mouth daily. 07/19/21     montelukast (SINGULAIR) 10 MG tablet Take 1 tablet by mouth daily 03/19/22     montelukast (SINGULAIR) 10 MG tablet  Take 1 tablet (10 mg total) by mouth daily. 07/02/22     montelukast (SINGULAIR) 10 MG tablet Take 1 tablet (10 mg total) by mouth daily. 09/27/22     omeprazole (PRILOSEC) 20 MG capsule Take 1 capsule by mouth twice a day 11/27/21     oxyCODONE-acetaminophen (PERCOCET/ROXICET) 5-325 MG tablet Take 1 tablet by mouth 3 (three) times daily. Takes for chronic back pain 08/03/15   [provider]  oxyCODONE-acetaminophen (PERCOCET/ROXICET) 5-325 MG tablet Take 1 tablet by mouth 3 times daily as needed 08/01/21     oxyCODONE-acetaminophen (PERCOCET/ROXICET) 5-325 MG tablet Take 1 tablet by mouth three times a day as needed 03/30/22     oxyCODONE-acetaminophen (PERCOCET/ROXICET) 5-325 MG tablet Take 1 tablet by mouth 3 (three) times daily as needed. 12/03/22     predniSONE (DELTASONE) 20 MG tablet Take 2 tablets daily with breakfast. 12/25/22   Cathlyn Parsons, NP  promethazine-dextromethorphan (PROMETHAZINE-DM) 6.25-15 MG/5ML syrup Take 5 mLs by mouth 3 (three) times daily. Patient not taking: Reported on 12/25/2022 11/24/21   Wallis Bamberg, PA-C  sildenafil (VIAGRA) 100 MG tablet TAKE ONE TABLET BY MOUTH AS NEEDED ONE HOUR PRIOR TO INTERCOURSE 07/21/20 07/21/21  Rene Paci, MD  sildenafil (VIAGRA) 100 MG tablet Take 1 tablet by mouth one hour prior to intercourse 07/17/21     sildenafil (VIAGRA) 100 MG tablet Take 1 tablet by mouth 1 hour prior to intercourse. 07/17/21     tadalafil (CIALIS) 20 MG tablet TAKE 1 TABLET BY MOUTH AS NEEDED ONE HOUR PRIOR TO INTERCOURSE 01/21/20 01/20/21  Rene Paci, MD  Tiotropium Bromide Monohydrate (SPIRIVA RESPIMAT) 1.25 MCG/ACT AERS Inhale 2 puffs by mouth into the lungs  once daily 06/19/21     Tiotropium Bromide Monohydrate 1.25 MCG/ACT AERS Inhale 2 puffs by mouth once daily as needed 06/19/21     TRELEGY ELLIPTA 100-62.5-25 MCG/INH AEPB Inhale 1 puff into the lungs daily. 03/30/20   [provider]    Family History Family History  Problem Relation Age of Onset   Hypertension Mother    Cancer Maternal Aunt        Breast    Social History Social History   Tobacco Use   Smoking status: Never   Smokeless tobacco: Never  Vaping Use   Vaping Use: Never used  Substance Use Topics   Alcohol use: No    Alcohol/week: 0.0 standard drinks of alcohol   Drug use: No     Allergies   Molds & smuts and Dust mite extract   Review of Systems Review of Systems   Physical Exam Triage Vital Signs ED Triage Vitals  Enc Vitals Group     BP 12/25/22 1331 (!) 141/75     Pulse Rate 12/25/22 1331 74     Resp 12/25/22 1331 18     Temp 12/25/22 1331 98.1 F (36.7 C)     Temp Source 12/25/22 1331 Oral     SpO2 12/25/22 1331 96 %     Weight --      Height --      Head Circumference --      Peak Flow --      Pain Score 12/25/22 1332 0     Pain Loc --      Pain Edu? --      Excl. in GC? --    No data found.  Updated Vital Signs BP (!) 141/75 (BP Location: Right Arm)   Pulse 74   Temp 98.1  F (36.7 C) (Oral)   Resp 18   SpO2 96%   Visual Acuity Right Eye Distance:   Left Eye Distance:   Bilateral Distance:    Right Eye Near:   Left Eye Near:    Bilateral Near:     Physical Exam Constitutional:      General: He is not in acute distress.    Appearance: Normal appearance. He is not ill-appearing.  Cardiovascular:     Rate and Rhythm: Normal rate and regular rhythm.  Pulmonary:     Breath sounds: Wheezing present.     Comments: Diffuse faint wheezing and diminished sounds throughout Neurological:     Mental Status: He is alert.      UC Treatments / Results  Labs (all labs ordered are listed, but only abnormal results  are displayed) Labs Reviewed - No data to display  EKG   Radiology No results found.  Procedures Procedures (including critical care time)  Medications Ordered in UC Medications  ipratropium-albuterol (DUONEB) 0.5-2.5 (3) MG/3ML nebulizer solution 3 mL (3 mLs Nebulization Given 12/25/22 1348)  methylPREDNISolone acetate (DEPO-MEDROL) injection 80 mg (80 mg Intramuscular Given 12/25/22 1348)  albuterol (PROVENTIL) (2.5 MG/3ML) 0.083% nebulizer solution 2.5 mg (2.5 mg Nebulization Given 12/25/22 1408)    Initial Impression / Assessment and Plan / UC Course  I have reviewed the triage vital signs and the nursing notes.  Pertinent labs & imaging results that were available during my care of the patient were reviewed by me and considered in my medical decision making (see chart for details).    Given IM depomedrol. Given duoneb. After this, pt did not feel better and lung sounds unchanged. Given albuterol neb - now pt is moving air well throughout lungs. Wheezing is more pronounced/louder but I suspect that is because airways are opening up when before they were constricted with less air movement. Pt feels much better and SOB/chest tightness improved. Advised pt to f/u with asthma care provider for when this ahppens in the future - pt reports it happens about once a year. Rx albuterol nebs to use at home - I suspect pump albuterol not getting to lungs as much as needed. Rx prednisone. Pt still with asthma exacerbation despite feeling better. He will monitor himself at home and return if breathing not improving. I suspect steroids will help him.   Final Clinical Impressions(s) / UC Diagnoses   Final diagnoses:  Asthma with acute exacerbation, unspecified asthma severity, unspecified whether persistent     Discharge Instructions      Use albuterol (either through the nebulizer or using the pump inhaler) every 4-6 hours if needed for wheezing and shortness of breath. Start the prednisone  tomorrow morning.   Follow up with your asthma care provider. If you are not getting better and breathing is getting worse please return to urgent care or, if after hours, go to emergency room.    ED Prescriptions     Medication Sig Dispense Auth. Provider   predniSONE (DELTASONE) 20 MG tablet Take 2 tablets daily with breakfast. 10 tablet Cathlyn Parsons, NP   albuterol (PROVENTIL) (2.5 MG/3ML) 0.083% nebulizer solution Take 3 mLs (2.5 mg total) by nebulization every 6 (six) hours as needed for wheezing or shortness of breath. 75 mL Cathlyn Parsons, NP      PDMP not reviewed this encounter.   Cathlyn Parsons, NP 12/25/22 1433

## 2022-12-25 NOTE — Discharge Instructions (Signed)
Use albuterol (either through the nebulizer or using the pump inhaler) every 4-6 hours if needed for wheezing and shortness of breath. Start the prednisone tomorrow morning.   Follow up with your asthma care provider. If you are not getting better and breathing is getting worse please return to urgent care or, if after hours, go to emergency room.

## 2023-01-01 DIAGNOSIS — R3912 Poor urinary stream: Secondary | ICD-10-CM | POA: Diagnosis not present

## 2023-01-01 DIAGNOSIS — J3081 Allergic rhinitis due to animal (cat) (dog) hair and dander: Secondary | ICD-10-CM | POA: Diagnosis not present

## 2023-01-01 DIAGNOSIS — E291 Testicular hypofunction: Secondary | ICD-10-CM | POA: Diagnosis not present

## 2023-01-01 DIAGNOSIS — R972 Elevated prostate specific antigen [PSA]: Secondary | ICD-10-CM | POA: Diagnosis not present

## 2023-01-01 DIAGNOSIS — N401 Enlarged prostate with lower urinary tract symptoms: Secondary | ICD-10-CM | POA: Diagnosis not present

## 2023-01-01 DIAGNOSIS — J3089 Other allergic rhinitis: Secondary | ICD-10-CM | POA: Diagnosis not present

## 2023-01-01 DIAGNOSIS — J301 Allergic rhinitis due to pollen: Secondary | ICD-10-CM | POA: Diagnosis not present

## 2023-01-01 DIAGNOSIS — N5201 Erectile dysfunction due to arterial insufficiency: Secondary | ICD-10-CM | POA: Diagnosis not present

## 2023-01-01 DIAGNOSIS — R300 Dysuria: Secondary | ICD-10-CM | POA: Diagnosis not present

## 2023-01-04 ENCOUNTER — Other Ambulatory Visit: Payer: Self-pay | Admitting: Adult Health

## 2023-01-04 DIAGNOSIS — R972 Elevated prostate specific antigen [PSA]: Secondary | ICD-10-CM

## 2023-01-07 ENCOUNTER — Other Ambulatory Visit (HOSPITAL_COMMUNITY): Payer: Self-pay

## 2023-01-07 MED ORDER — OXYCODONE-ACETAMINOPHEN 5-325 MG PO TABS
ORAL_TABLET | ORAL | 0 refills | Status: DC
  Filled 2023-01-07: qty 90, 30d supply, fill #0

## 2023-01-10 ENCOUNTER — Other Ambulatory Visit (HOSPITAL_COMMUNITY): Payer: Self-pay

## 2023-01-10 DIAGNOSIS — J3089 Other allergic rhinitis: Secondary | ICD-10-CM | POA: Diagnosis not present

## 2023-01-10 DIAGNOSIS — J4540 Moderate persistent asthma, uncomplicated: Secondary | ICD-10-CM | POA: Diagnosis not present

## 2023-01-10 DIAGNOSIS — J301 Allergic rhinitis due to pollen: Secondary | ICD-10-CM | POA: Diagnosis not present

## 2023-01-10 DIAGNOSIS — T781XXD Other adverse food reactions, not elsewhere classified, subsequent encounter: Secondary | ICD-10-CM | POA: Diagnosis not present

## 2023-01-10 MED ORDER — LEVOCETIRIZINE DIHYDROCHLORIDE 5 MG PO TABS
5.0000 mg | ORAL_TABLET | Freq: Every day | ORAL | 3 refills | Status: AC
  Filled 2023-01-10: qty 30, 30d supply, fill #0

## 2023-01-10 MED ORDER — CEFDINIR 300 MG PO CAPS
300.0000 mg | ORAL_CAPSULE | Freq: Two times a day (BID) | ORAL | 0 refills | Status: DC
  Filled 2023-01-10: qty 20, 10d supply, fill #0

## 2023-01-10 MED ORDER — PREDNISONE 10 MG PO TABS
ORAL_TABLET | ORAL | 0 refills | Status: AC
  Filled 2023-01-10: qty 30, 10d supply, fill #0

## 2023-01-11 ENCOUNTER — Other Ambulatory Visit (HOSPITAL_COMMUNITY): Payer: Self-pay

## 2023-01-11 MED ORDER — CLOMID 50 MG PO TABS
50.0000 mg | ORAL_TABLET | Freq: Every day | ORAL | 3 refills | Status: AC
  Filled 2023-01-11 – 2023-02-07 (×2): qty 90, 90d supply, fill #0
  Filled 2023-05-31: qty 90, 90d supply, fill #1

## 2023-01-21 ENCOUNTER — Other Ambulatory Visit (HOSPITAL_COMMUNITY): Payer: Self-pay

## 2023-01-21 DIAGNOSIS — J301 Allergic rhinitis due to pollen: Secondary | ICD-10-CM | POA: Diagnosis not present

## 2023-01-21 DIAGNOSIS — J3081 Allergic rhinitis due to animal (cat) (dog) hair and dander: Secondary | ICD-10-CM | POA: Diagnosis not present

## 2023-01-21 DIAGNOSIS — G4733 Obstructive sleep apnea (adult) (pediatric): Secondary | ICD-10-CM | POA: Diagnosis not present

## 2023-01-21 DIAGNOSIS — J3089 Other allergic rhinitis: Secondary | ICD-10-CM | POA: Diagnosis not present

## 2023-02-01 ENCOUNTER — Other Ambulatory Visit (HOSPITAL_COMMUNITY): Payer: Self-pay

## 2023-02-01 MED ORDER — OXYCODONE-ACETAMINOPHEN 5-325 MG PO TABS
1.0000 | ORAL_TABLET | Freq: Three times a day (TID) | ORAL | 0 refills | Status: DC | PRN
  Filled 2023-02-01 – 2023-02-04 (×2): qty 90, 30d supply, fill #0

## 2023-02-04 ENCOUNTER — Other Ambulatory Visit (HOSPITAL_COMMUNITY): Payer: Self-pay

## 2023-02-05 ENCOUNTER — Other Ambulatory Visit (HOSPITAL_COMMUNITY): Payer: Self-pay

## 2023-02-05 DIAGNOSIS — J301 Allergic rhinitis due to pollen: Secondary | ICD-10-CM | POA: Diagnosis not present

## 2023-02-05 DIAGNOSIS — J3089 Other allergic rhinitis: Secondary | ICD-10-CM | POA: Diagnosis not present

## 2023-02-05 DIAGNOSIS — J3081 Allergic rhinitis due to animal (cat) (dog) hair and dander: Secondary | ICD-10-CM | POA: Diagnosis not present

## 2023-02-07 ENCOUNTER — Other Ambulatory Visit (HOSPITAL_COMMUNITY): Payer: Self-pay

## 2023-02-07 MED ORDER — LEVOTHYROXINE SODIUM 50 MCG PO TABS
50.0000 ug | ORAL_TABLET | Freq: Every morning | ORAL | 4 refills | Status: DC
  Filled 2023-02-07: qty 90, 90d supply, fill #0
  Filled 2023-05-13: qty 90, 90d supply, fill #1
  Filled 2023-08-08: qty 90, 90d supply, fill #2
  Filled 2023-12-01: qty 90, 90d supply, fill #3

## 2023-02-11 ENCOUNTER — Other Ambulatory Visit (HOSPITAL_COMMUNITY): Payer: Self-pay

## 2023-02-19 ENCOUNTER — Other Ambulatory Visit: Payer: Commercial Managed Care - PPO

## 2023-02-27 DIAGNOSIS — J301 Allergic rhinitis due to pollen: Secondary | ICD-10-CM | POA: Diagnosis not present

## 2023-02-27 DIAGNOSIS — J3089 Other allergic rhinitis: Secondary | ICD-10-CM | POA: Diagnosis not present

## 2023-02-27 DIAGNOSIS — J3081 Allergic rhinitis due to animal (cat) (dog) hair and dander: Secondary | ICD-10-CM | POA: Diagnosis not present

## 2023-02-28 ENCOUNTER — Other Ambulatory Visit (HOSPITAL_COMMUNITY): Payer: Self-pay

## 2023-02-28 MED ORDER — AMLODIPINE BESYLATE-VALSARTAN 10-320 MG PO TABS
1.0000 | ORAL_TABLET | Freq: Every day | ORAL | 11 refills | Status: DC
  Filled 2023-02-28: qty 30, 30d supply, fill #0
  Filled 2023-04-13 – 2023-04-15 (×3): qty 30, 30d supply, fill #1
  Filled 2023-04-15 – 2023-04-16 (×2): qty 90, 90d supply, fill #1
  Filled 2023-08-01: qty 90, 90d supply, fill #2
  Filled 2023-12-01: qty 90, 90d supply, fill #3

## 2023-03-01 ENCOUNTER — Other Ambulatory Visit (HOSPITAL_COMMUNITY): Payer: Self-pay

## 2023-03-01 MED ORDER — OXYCODONE-ACETAMINOPHEN 5-325 MG PO TABS
1.0000 | ORAL_TABLET | Freq: Three times a day (TID) | ORAL | 0 refills | Status: DC | PRN
  Filled 2023-03-05: qty 90, 30d supply, fill #0

## 2023-03-04 ENCOUNTER — Other Ambulatory Visit: Payer: Self-pay

## 2023-03-04 DIAGNOSIS — J3089 Other allergic rhinitis: Secondary | ICD-10-CM | POA: Diagnosis not present

## 2023-03-04 DIAGNOSIS — J3081 Allergic rhinitis due to animal (cat) (dog) hair and dander: Secondary | ICD-10-CM | POA: Diagnosis not present

## 2023-03-04 DIAGNOSIS — J301 Allergic rhinitis due to pollen: Secondary | ICD-10-CM | POA: Diagnosis not present

## 2023-03-05 ENCOUNTER — Other Ambulatory Visit (HOSPITAL_COMMUNITY): Payer: Self-pay

## 2023-03-06 DIAGNOSIS — G4733 Obstructive sleep apnea (adult) (pediatric): Secondary | ICD-10-CM | POA: Diagnosis not present

## 2023-03-07 ENCOUNTER — Other Ambulatory Visit (HOSPITAL_COMMUNITY): Payer: Self-pay

## 2023-03-13 DIAGNOSIS — J3081 Allergic rhinitis due to animal (cat) (dog) hair and dander: Secondary | ICD-10-CM | POA: Diagnosis not present

## 2023-03-13 DIAGNOSIS — J301 Allergic rhinitis due to pollen: Secondary | ICD-10-CM | POA: Diagnosis not present

## 2023-03-13 DIAGNOSIS — J3089 Other allergic rhinitis: Secondary | ICD-10-CM | POA: Diagnosis not present

## 2023-03-18 DIAGNOSIS — I10 Essential (primary) hypertension: Secondary | ICD-10-CM | POA: Diagnosis not present

## 2023-03-18 DIAGNOSIS — J309 Allergic rhinitis, unspecified: Secondary | ICD-10-CM | POA: Diagnosis not present

## 2023-03-18 DIAGNOSIS — G4733 Obstructive sleep apnea (adult) (pediatric): Secondary | ICD-10-CM | POA: Diagnosis not present

## 2023-03-18 DIAGNOSIS — Z6838 Body mass index (BMI) 38.0-38.9, adult: Secondary | ICD-10-CM | POA: Diagnosis not present

## 2023-03-18 DIAGNOSIS — G894 Chronic pain syndrome: Secondary | ICD-10-CM | POA: Diagnosis not present

## 2023-03-18 DIAGNOSIS — J449 Chronic obstructive pulmonary disease, unspecified: Secondary | ICD-10-CM | POA: Diagnosis not present

## 2023-03-18 DIAGNOSIS — E039 Hypothyroidism, unspecified: Secondary | ICD-10-CM | POA: Diagnosis not present

## 2023-03-18 DIAGNOSIS — F331 Major depressive disorder, recurrent, moderate: Secondary | ICD-10-CM | POA: Diagnosis not present

## 2023-03-18 DIAGNOSIS — E291 Testicular hypofunction: Secondary | ICD-10-CM | POA: Diagnosis not present

## 2023-03-19 DIAGNOSIS — J3081 Allergic rhinitis due to animal (cat) (dog) hair and dander: Secondary | ICD-10-CM | POA: Diagnosis not present

## 2023-03-19 DIAGNOSIS — J301 Allergic rhinitis due to pollen: Secondary | ICD-10-CM | POA: Diagnosis not present

## 2023-03-19 DIAGNOSIS — J3089 Other allergic rhinitis: Secondary | ICD-10-CM | POA: Diagnosis not present

## 2023-03-27 ENCOUNTER — Encounter: Payer: Self-pay | Admitting: Adult Health

## 2023-04-02 ENCOUNTER — Other Ambulatory Visit (HOSPITAL_COMMUNITY): Payer: Self-pay

## 2023-04-02 MED ORDER — OXYCODONE-ACETAMINOPHEN 5-325 MG PO TABS
1.0000 | ORAL_TABLET | Freq: Three times a day (TID) | ORAL | 0 refills | Status: DC | PRN
  Filled 2023-04-02: qty 90, 30d supply, fill #0

## 2023-04-03 ENCOUNTER — Other Ambulatory Visit (HOSPITAL_COMMUNITY): Payer: Self-pay

## 2023-04-05 ENCOUNTER — Other Ambulatory Visit (HOSPITAL_COMMUNITY): Payer: Self-pay

## 2023-04-05 DIAGNOSIS — J3089 Other allergic rhinitis: Secondary | ICD-10-CM | POA: Diagnosis not present

## 2023-04-05 DIAGNOSIS — J3081 Allergic rhinitis due to animal (cat) (dog) hair and dander: Secondary | ICD-10-CM | POA: Diagnosis not present

## 2023-04-05 DIAGNOSIS — J301 Allergic rhinitis due to pollen: Secondary | ICD-10-CM | POA: Diagnosis not present

## 2023-04-06 ENCOUNTER — Other Ambulatory Visit: Payer: Self-pay

## 2023-04-06 DIAGNOSIS — G4733 Obstructive sleep apnea (adult) (pediatric): Secondary | ICD-10-CM | POA: Diagnosis not present

## 2023-04-13 ENCOUNTER — Other Ambulatory Visit (HOSPITAL_COMMUNITY): Payer: Self-pay

## 2023-04-14 ENCOUNTER — Other Ambulatory Visit (HOSPITAL_COMMUNITY): Payer: Self-pay

## 2023-04-14 MED ORDER — MONTELUKAST SODIUM 10 MG PO TABS
10.0000 mg | ORAL_TABLET | Freq: Every day | ORAL | 0 refills | Status: DC
  Filled 2023-04-14: qty 90, 90d supply, fill #0

## 2023-04-15 ENCOUNTER — Other Ambulatory Visit: Payer: Self-pay

## 2023-04-15 ENCOUNTER — Other Ambulatory Visit (HOSPITAL_COMMUNITY): Payer: Self-pay

## 2023-04-16 ENCOUNTER — Other Ambulatory Visit: Payer: Self-pay

## 2023-04-16 ENCOUNTER — Other Ambulatory Visit (HOSPITAL_COMMUNITY): Payer: Self-pay

## 2023-04-16 DIAGNOSIS — J3081 Allergic rhinitis due to animal (cat) (dog) hair and dander: Secondary | ICD-10-CM | POA: Diagnosis not present

## 2023-04-16 DIAGNOSIS — J301 Allergic rhinitis due to pollen: Secondary | ICD-10-CM | POA: Diagnosis not present

## 2023-04-16 DIAGNOSIS — J3089 Other allergic rhinitis: Secondary | ICD-10-CM | POA: Diagnosis not present

## 2023-04-22 DIAGNOSIS — J3081 Allergic rhinitis due to animal (cat) (dog) hair and dander: Secondary | ICD-10-CM | POA: Diagnosis not present

## 2023-04-22 DIAGNOSIS — J301 Allergic rhinitis due to pollen: Secondary | ICD-10-CM | POA: Diagnosis not present

## 2023-04-22 DIAGNOSIS — J3089 Other allergic rhinitis: Secondary | ICD-10-CM | POA: Diagnosis not present

## 2023-04-29 DIAGNOSIS — J3081 Allergic rhinitis due to animal (cat) (dog) hair and dander: Secondary | ICD-10-CM | POA: Diagnosis not present

## 2023-04-29 DIAGNOSIS — J301 Allergic rhinitis due to pollen: Secondary | ICD-10-CM | POA: Diagnosis not present

## 2023-04-29 DIAGNOSIS — J3089 Other allergic rhinitis: Secondary | ICD-10-CM | POA: Diagnosis not present

## 2023-04-30 ENCOUNTER — Other Ambulatory Visit (HOSPITAL_COMMUNITY): Payer: Self-pay

## 2023-04-30 MED ORDER — OXYCODONE-ACETAMINOPHEN 5-325 MG PO TABS
1.0000 | ORAL_TABLET | Freq: Three times a day (TID) | ORAL | 0 refills | Status: DC
  Filled 2023-04-30: qty 90, 30d supply, fill #0

## 2023-05-06 DIAGNOSIS — J3089 Other allergic rhinitis: Secondary | ICD-10-CM | POA: Diagnosis not present

## 2023-05-06 DIAGNOSIS — J3081 Allergic rhinitis due to animal (cat) (dog) hair and dander: Secondary | ICD-10-CM | POA: Diagnosis not present

## 2023-05-06 DIAGNOSIS — J301 Allergic rhinitis due to pollen: Secondary | ICD-10-CM | POA: Diagnosis not present

## 2023-05-06 DIAGNOSIS — G4733 Obstructive sleep apnea (adult) (pediatric): Secondary | ICD-10-CM | POA: Diagnosis not present

## 2023-05-11 ENCOUNTER — Other Ambulatory Visit: Payer: Self-pay

## 2023-05-13 ENCOUNTER — Other Ambulatory Visit (HOSPITAL_COMMUNITY): Payer: Self-pay

## 2023-05-20 ENCOUNTER — Other Ambulatory Visit (HOSPITAL_COMMUNITY): Payer: Self-pay

## 2023-05-21 ENCOUNTER — Other Ambulatory Visit (HOSPITAL_COMMUNITY): Payer: Self-pay

## 2023-05-23 DIAGNOSIS — J301 Allergic rhinitis due to pollen: Secondary | ICD-10-CM | POA: Diagnosis not present

## 2023-05-23 DIAGNOSIS — J3089 Other allergic rhinitis: Secondary | ICD-10-CM | POA: Diagnosis not present

## 2023-05-23 DIAGNOSIS — J3081 Allergic rhinitis due to animal (cat) (dog) hair and dander: Secondary | ICD-10-CM | POA: Diagnosis not present

## 2023-05-30 ENCOUNTER — Other Ambulatory Visit (HOSPITAL_COMMUNITY): Payer: Self-pay

## 2023-05-30 MED ORDER — OXYCODONE-ACETAMINOPHEN 5-325 MG PO TABS
1.0000 | ORAL_TABLET | Freq: Three times a day (TID) | ORAL | 0 refills | Status: DC | PRN
  Filled 2023-05-30: qty 90, 30d supply, fill #0

## 2023-05-30 MED ORDER — SILDENAFIL CITRATE 100 MG PO TABS
100.0000 mg | ORAL_TABLET | Freq: Every day | ORAL | 4 refills | Status: DC | PRN
  Filled 2023-05-30: qty 15, 15d supply, fill #0

## 2023-05-31 ENCOUNTER — Other Ambulatory Visit (HOSPITAL_COMMUNITY): Payer: Self-pay

## 2023-06-03 ENCOUNTER — Other Ambulatory Visit (HOSPITAL_COMMUNITY): Payer: Self-pay

## 2023-06-04 ENCOUNTER — Other Ambulatory Visit (HOSPITAL_COMMUNITY): Payer: Self-pay

## 2023-06-04 DIAGNOSIS — J4540 Moderate persistent asthma, uncomplicated: Secondary | ICD-10-CM | POA: Diagnosis not present

## 2023-06-04 DIAGNOSIS — K219 Gastro-esophageal reflux disease without esophagitis: Secondary | ICD-10-CM | POA: Diagnosis not present

## 2023-06-04 DIAGNOSIS — J3081 Allergic rhinitis due to animal (cat) (dog) hair and dander: Secondary | ICD-10-CM | POA: Diagnosis not present

## 2023-06-04 DIAGNOSIS — J301 Allergic rhinitis due to pollen: Secondary | ICD-10-CM | POA: Diagnosis not present

## 2023-06-04 DIAGNOSIS — J3089 Other allergic rhinitis: Secondary | ICD-10-CM | POA: Diagnosis not present

## 2023-06-04 MED ORDER — ALBUTEROL SULFATE HFA 108 (90 BASE) MCG/ACT IN AERS
2.0000 | INHALATION_SPRAY | RESPIRATORY_TRACT | 0 refills | Status: DC | PRN
  Filled 2023-06-04: qty 6.7, 16d supply, fill #0

## 2023-06-04 MED ORDER — TRELEGY ELLIPTA 100-62.5-25 MCG/ACT IN AEPB
1.0000 | INHALATION_SPRAY | Freq: Every day | RESPIRATORY_TRACT | 3 refills | Status: DC
  Filled 2023-06-04 – 2023-08-21 (×3): qty 60, 30d supply, fill #0
  Filled 2023-09-02: qty 180, 90d supply, fill #0

## 2023-06-14 ENCOUNTER — Other Ambulatory Visit (HOSPITAL_COMMUNITY): Payer: Self-pay

## 2023-06-19 ENCOUNTER — Other Ambulatory Visit (HOSPITAL_COMMUNITY): Payer: Self-pay

## 2023-06-19 DIAGNOSIS — J3089 Other allergic rhinitis: Secondary | ICD-10-CM | POA: Diagnosis not present

## 2023-06-19 DIAGNOSIS — J301 Allergic rhinitis due to pollen: Secondary | ICD-10-CM | POA: Diagnosis not present

## 2023-06-20 DIAGNOSIS — G4733 Obstructive sleep apnea (adult) (pediatric): Secondary | ICD-10-CM | POA: Diagnosis not present

## 2023-06-22 ENCOUNTER — Ambulatory Visit
Admission: RE | Admit: 2023-06-22 | Discharge: 2023-06-22 | Disposition: A | Discharge status: Home / Self Care | Payer: Commercial Managed Care - PPO | Source: Ambulatory Visit | Attending: Adult Health | Admitting: Adult Health

## 2023-06-22 DIAGNOSIS — R972 Elevated prostate specific antigen [PSA]: Secondary | ICD-10-CM

## 2023-06-22 MED ORDER — GADOPICLENOL 0.5 MMOL/ML IV SOLN
10.0000 mL | Freq: Once | INTRAVENOUS | Status: AC | PRN
  Administered 2023-06-22: 10 mL via INTRAVENOUS

## 2023-06-25 DIAGNOSIS — Z6838 Body mass index (BMI) 38.0-38.9, adult: Secondary | ICD-10-CM | POA: Diagnosis not present

## 2023-06-25 DIAGNOSIS — Z0000 Encounter for general adult medical examination without abnormal findings: Secondary | ICD-10-CM | POA: Diagnosis not present

## 2023-07-01 ENCOUNTER — Other Ambulatory Visit (HOSPITAL_COMMUNITY): Payer: Self-pay

## 2023-07-01 DIAGNOSIS — E291 Testicular hypofunction: Secondary | ICD-10-CM | POA: Diagnosis not present

## 2023-07-01 DIAGNOSIS — R972 Elevated prostate specific antigen [PSA]: Secondary | ICD-10-CM | POA: Diagnosis not present

## 2023-07-01 DIAGNOSIS — N5201 Erectile dysfunction due to arterial insufficiency: Secondary | ICD-10-CM | POA: Diagnosis not present

## 2023-07-01 DIAGNOSIS — N200 Calculus of kidney: Secondary | ICD-10-CM | POA: Diagnosis not present

## 2023-07-01 MED ORDER — LEVOFLOXACIN 750 MG PO TABS
ORAL_TABLET | ORAL | 0 refills | Status: DC
  Filled 2023-07-01: qty 1, 1d supply, fill #0

## 2023-07-02 ENCOUNTER — Other Ambulatory Visit (HOSPITAL_COMMUNITY): Payer: Self-pay

## 2023-07-02 DIAGNOSIS — J3089 Other allergic rhinitis: Secondary | ICD-10-CM | POA: Diagnosis not present

## 2023-07-02 DIAGNOSIS — J301 Allergic rhinitis due to pollen: Secondary | ICD-10-CM | POA: Diagnosis not present

## 2023-07-02 DIAGNOSIS — J3081 Allergic rhinitis due to animal (cat) (dog) hair and dander: Secondary | ICD-10-CM | POA: Diagnosis not present

## 2023-07-02 MED ORDER — SILDENAFIL CITRATE 100 MG PO TABS
100.0000 mg | ORAL_TABLET | ORAL | 5 refills | Status: AC
  Filled 2023-07-02: qty 5, 30d supply, fill #0

## 2023-07-05 ENCOUNTER — Other Ambulatory Visit (HOSPITAL_COMMUNITY): Payer: Self-pay

## 2023-07-05 MED ORDER — OXYCODONE-ACETAMINOPHEN 5-325 MG PO TABS
1.0000 | ORAL_TABLET | Freq: Three times a day (TID) | ORAL | 0 refills | Status: DC | PRN
  Filled 2023-07-05: qty 90, 30d supply, fill #0

## 2023-07-16 DIAGNOSIS — N200 Calculus of kidney: Secondary | ICD-10-CM | POA: Diagnosis not present

## 2023-07-20 DIAGNOSIS — G4733 Obstructive sleep apnea (adult) (pediatric): Secondary | ICD-10-CM | POA: Diagnosis not present

## 2023-07-22 DIAGNOSIS — J301 Allergic rhinitis due to pollen: Secondary | ICD-10-CM | POA: Diagnosis not present

## 2023-07-22 DIAGNOSIS — J3081 Allergic rhinitis due to animal (cat) (dog) hair and dander: Secondary | ICD-10-CM | POA: Diagnosis not present

## 2023-07-22 DIAGNOSIS — J3089 Other allergic rhinitis: Secondary | ICD-10-CM | POA: Diagnosis not present

## 2023-07-29 ENCOUNTER — Other Ambulatory Visit (HOSPITAL_COMMUNITY): Payer: Self-pay

## 2023-07-30 ENCOUNTER — Other Ambulatory Visit (HOSPITAL_COMMUNITY): Payer: Self-pay

## 2023-07-30 MED ORDER — DOXAZOSIN MESYLATE 8 MG PO TABS
8.0000 mg | ORAL_TABLET | Freq: Every day | ORAL | 3 refills | Status: AC
  Filled 2023-07-30: qty 90, 90d supply, fill #0
  Filled 2023-12-01: qty 90, 90d supply, fill #1
  Filled 2024-04-30: qty 90, 90d supply, fill #2

## 2023-08-02 ENCOUNTER — Other Ambulatory Visit (HOSPITAL_COMMUNITY): Payer: Self-pay

## 2023-08-03 ENCOUNTER — Other Ambulatory Visit (HOSPITAL_COMMUNITY): Payer: Self-pay

## 2023-08-03 MED ORDER — OXYCODONE-ACETAMINOPHEN 5-325 MG PO TABS
1.0000 | ORAL_TABLET | Freq: Three times a day (TID) | ORAL | 0 refills | Status: AC | PRN
  Filled 2023-08-03: qty 90, 30d supply, fill #0

## 2023-08-05 ENCOUNTER — Other Ambulatory Visit (HOSPITAL_COMMUNITY): Payer: Self-pay

## 2023-08-06 DIAGNOSIS — J3089 Other allergic rhinitis: Secondary | ICD-10-CM | POA: Diagnosis not present

## 2023-08-06 DIAGNOSIS — J301 Allergic rhinitis due to pollen: Secondary | ICD-10-CM | POA: Diagnosis not present

## 2023-08-06 DIAGNOSIS — J3081 Allergic rhinitis due to animal (cat) (dog) hair and dander: Secondary | ICD-10-CM | POA: Diagnosis not present

## 2023-08-10 ENCOUNTER — Other Ambulatory Visit (HOSPITAL_COMMUNITY): Payer: Self-pay

## 2023-08-12 ENCOUNTER — Other Ambulatory Visit (HOSPITAL_COMMUNITY): Payer: Self-pay

## 2023-08-12 DIAGNOSIS — I10 Essential (primary) hypertension: Secondary | ICD-10-CM | POA: Diagnosis not present

## 2023-08-12 DIAGNOSIS — G4733 Obstructive sleep apnea (adult) (pediatric): Secondary | ICD-10-CM | POA: Diagnosis not present

## 2023-08-13 DIAGNOSIS — J3081 Allergic rhinitis due to animal (cat) (dog) hair and dander: Secondary | ICD-10-CM | POA: Diagnosis not present

## 2023-08-13 DIAGNOSIS — J3089 Other allergic rhinitis: Secondary | ICD-10-CM | POA: Diagnosis not present

## 2023-08-13 DIAGNOSIS — J301 Allergic rhinitis due to pollen: Secondary | ICD-10-CM | POA: Diagnosis not present

## 2023-08-20 DIAGNOSIS — G4733 Obstructive sleep apnea (adult) (pediatric): Secondary | ICD-10-CM | POA: Diagnosis not present

## 2023-08-21 ENCOUNTER — Other Ambulatory Visit (HOSPITAL_COMMUNITY): Payer: Self-pay

## 2023-08-23 ENCOUNTER — Other Ambulatory Visit (HOSPITAL_COMMUNITY): Payer: Self-pay

## 2023-08-26 DIAGNOSIS — C61 Malignant neoplasm of prostate: Secondary | ICD-10-CM | POA: Diagnosis not present

## 2023-08-26 DIAGNOSIS — R972 Elevated prostate specific antigen [PSA]: Secondary | ICD-10-CM | POA: Diagnosis not present

## 2023-08-29 DIAGNOSIS — J3089 Other allergic rhinitis: Secondary | ICD-10-CM | POA: Diagnosis not present

## 2023-08-29 DIAGNOSIS — J301 Allergic rhinitis due to pollen: Secondary | ICD-10-CM | POA: Diagnosis not present

## 2023-08-29 DIAGNOSIS — J3081 Allergic rhinitis due to animal (cat) (dog) hair and dander: Secondary | ICD-10-CM | POA: Diagnosis not present

## 2023-08-30 ENCOUNTER — Other Ambulatory Visit (HOSPITAL_COMMUNITY): Payer: Self-pay

## 2023-08-31 ENCOUNTER — Other Ambulatory Visit (HOSPITAL_COMMUNITY): Payer: Self-pay

## 2023-09-02 ENCOUNTER — Other Ambulatory Visit (HOSPITAL_COMMUNITY): Payer: Self-pay

## 2023-09-02 MED ORDER — MONTELUKAST SODIUM 10 MG PO TABS
10.0000 mg | ORAL_TABLET | Freq: Every day | ORAL | 1 refills | Status: DC
  Filled 2023-09-02: qty 90, 90d supply, fill #0
  Filled 2023-12-01: qty 90, 90d supply, fill #1

## 2023-09-04 ENCOUNTER — Other Ambulatory Visit: Payer: Self-pay

## 2023-09-04 ENCOUNTER — Other Ambulatory Visit (HOSPITAL_COMMUNITY): Payer: Self-pay

## 2023-09-04 MED ORDER — OXYCODONE-ACETAMINOPHEN 5-325 MG PO TABS
1.0000 | ORAL_TABLET | Freq: Three times a day (TID) | ORAL | 0 refills | Status: AC
  Filled 2023-09-04: qty 90, 30d supply, fill #0

## 2023-09-05 ENCOUNTER — Other Ambulatory Visit (HOSPITAL_COMMUNITY): Payer: Self-pay

## 2023-09-10 ENCOUNTER — Emergency Department (HOSPITAL_BASED_OUTPATIENT_CLINIC_OR_DEPARTMENT_OTHER): Payer: Commercial Managed Care - PPO

## 2023-09-10 ENCOUNTER — Other Ambulatory Visit: Payer: Self-pay

## 2023-09-10 ENCOUNTER — Encounter (HOSPITAL_BASED_OUTPATIENT_CLINIC_OR_DEPARTMENT_OTHER): Payer: Self-pay | Admitting: Emergency Medicine

## 2023-09-10 ENCOUNTER — Emergency Department (HOSPITAL_BASED_OUTPATIENT_CLINIC_OR_DEPARTMENT_OTHER)
Admission: EM | Admit: 2023-09-10 | Discharge: 2023-09-10 | Disposition: A | Discharge status: Home / Self Care | Payer: Commercial Managed Care - PPO | Attending: Emergency Medicine | Admitting: Emergency Medicine

## 2023-09-10 DIAGNOSIS — Z8546 Personal history of malignant neoplasm of prostate: Secondary | ICD-10-CM | POA: Insufficient documentation

## 2023-09-10 DIAGNOSIS — K5900 Constipation, unspecified: Secondary | ICD-10-CM | POA: Diagnosis not present

## 2023-09-10 DIAGNOSIS — N23 Unspecified renal colic: Secondary | ICD-10-CM

## 2023-09-10 DIAGNOSIS — I10 Essential (primary) hypertension: Secondary | ICD-10-CM | POA: Insufficient documentation

## 2023-09-10 DIAGNOSIS — N401 Enlarged prostate with lower urinary tract symptoms: Secondary | ICD-10-CM | POA: Diagnosis not present

## 2023-09-10 DIAGNOSIS — N132 Hydronephrosis with renal and ureteral calculous obstruction: Secondary | ICD-10-CM | POA: Insufficient documentation

## 2023-09-10 DIAGNOSIS — Z79899 Other long term (current) drug therapy: Secondary | ICD-10-CM | POA: Diagnosis not present

## 2023-09-10 DIAGNOSIS — N281 Cyst of kidney, acquired: Secondary | ICD-10-CM | POA: Diagnosis not present

## 2023-09-10 DIAGNOSIS — K429 Umbilical hernia without obstruction or gangrene: Secondary | ICD-10-CM | POA: Diagnosis not present

## 2023-09-10 DIAGNOSIS — R109 Unspecified abdominal pain: Secondary | ICD-10-CM | POA: Diagnosis present

## 2023-09-10 DIAGNOSIS — K56600 Partial intestinal obstruction, unspecified as to cause: Secondary | ICD-10-CM | POA: Diagnosis not present

## 2023-09-10 LAB — CBC WITH DIFFERENTIAL/PLATELET
Abs Immature Granulocytes: 0.03 10*3/uL (ref 0.00–0.07)
Basophils Absolute: 0 10*3/uL (ref 0.0–0.1)
Basophils Relative: 0 %
Eosinophils Absolute: 0.1 10*3/uL (ref 0.0–0.5)
Eosinophils Relative: 1 %
HCT: 36.8 % — ABNORMAL LOW (ref 39.0–52.0)
Hemoglobin: 11.9 g/dL — ABNORMAL LOW (ref 13.0–17.0)
Immature Granulocytes: 0 %
Lymphocytes Relative: 18 %
Lymphs Abs: 1.8 10*3/uL (ref 0.7–4.0)
MCH: 26.8 pg (ref 26.0–34.0)
MCHC: 32.3 g/dL (ref 30.0–36.0)
MCV: 82.9 fL (ref 80.0–100.0)
Monocytes Absolute: 0.8 10*3/uL (ref 0.1–1.0)
Monocytes Relative: 8 %
Neutro Abs: 7 10*3/uL (ref 1.7–7.7)
Neutrophils Relative %: 73 %
Platelets: 197 10*3/uL (ref 150–400)
RBC: 4.44 MIL/uL (ref 4.22–5.81)
RDW: 16.6 % — ABNORMAL HIGH (ref 11.5–15.5)
WBC: 9.7 10*3/uL (ref 4.0–10.5)
nRBC: 0 % (ref 0.0–0.2)

## 2023-09-10 LAB — URINALYSIS, ROUTINE W REFLEX MICROSCOPIC
Bacteria, UA: NONE SEEN
Bilirubin Urine: NEGATIVE
Glucose, UA: NEGATIVE mg/dL
Ketones, ur: NEGATIVE mg/dL
Leukocytes,Ua: NEGATIVE
Nitrite: NEGATIVE
Protein, ur: 30 mg/dL — AB
Specific Gravity, Urine: >1.046 — ABNORMAL HIGH (ref 1.005–1.030)
pH: 6 (ref 5.0–8.0)

## 2023-09-10 LAB — COMPREHENSIVE METABOLIC PANEL
ALT: 34 U/L (ref 0–44)
AST: 35 U/L (ref 15–41)
Albumin: 4.5 g/dL (ref 3.5–5.0)
Alkaline Phosphatase: 53 U/L (ref 38–126)
Anion gap: 8 (ref 5–15)
BUN: 17 mg/dL (ref 6–20)
CO2: 28 mmol/L (ref 22–32)
Calcium: 9.3 mg/dL (ref 8.9–10.3)
Chloride: 102 mmol/L (ref 98–111)
Creatinine, Ser: 1.74 mg/dL — ABNORMAL HIGH (ref 0.61–1.24)
GFR, Estimated: 45 mL/min — ABNORMAL LOW (ref >60)
Glucose, Bld: 120 mg/dL — ABNORMAL HIGH (ref 70–99)
Potassium: 4.2 mmol/L (ref 3.5–5.1)
Sodium: 138 mmol/L (ref 135–145)
Total Bilirubin: 0.6 mg/dL (ref 0.0–1.2)
Total Protein: 7.6 g/dL (ref 6.5–8.1)

## 2023-09-10 LAB — LIPASE, BLOOD: Lipase: 15 U/L (ref 11–51)

## 2023-09-10 MED ORDER — METOCLOPRAMIDE HCL 5 MG/ML IJ SOLN
10.0000 mg | Freq: Once | INTRAMUSCULAR | Status: AC
  Administered 2023-09-10: 10 mg via INTRAVENOUS
  Filled 2023-09-10: qty 2

## 2023-09-10 MED ORDER — KETOROLAC TROMETHAMINE 15 MG/ML IJ SOLN
15.0000 mg | Freq: Once | INTRAMUSCULAR | Status: AC
  Administered 2023-09-10: 15 mg via INTRAVENOUS
  Filled 2023-09-10: qty 1

## 2023-09-10 MED ORDER — BISACODYL 10 MG RE SUPP: 10.0000 mg | RECTAL | 0 refills | Status: AC | PRN

## 2023-09-10 MED ORDER — METOCLOPRAMIDE HCL 10 MG PO TABS: 10.0000 mg | ORAL_TABLET | Freq: Four times a day (QID) | ORAL | 0 refills | Status: AC | PRN

## 2023-09-10 MED ORDER — SODIUM CHLORIDE 0.9 % IV BOLUS
1000.0000 mL | Freq: Once | INTRAVENOUS | Status: AC
  Administered 2023-09-10: 1000 mL via INTRAVENOUS

## 2023-09-10 MED ORDER — IOHEXOL 300 MG/ML  SOLN
100.0000 mL | Freq: Once | INTRAMUSCULAR | Status: AC | PRN
  Administered 2023-09-10: 75 mL via INTRAVENOUS

## 2023-09-10 MED ORDER — DICYCLOMINE HCL 10 MG PO CAPS
10.0000 mg | ORAL_CAPSULE | Freq: Once | ORAL | Status: AC
  Administered 2023-09-10: 10 mg via ORAL
  Filled 2023-09-10: qty 1

## 2023-09-10 NOTE — ED Provider Notes (Signed)
[facility] AT [facility] Provider Note   CSN: [id number] Arrival date & time: 09/10/23  1157     History  Chief Complaint  Patient presents with   Constipation    Nicholas Mcintosh is a 58 y.o. male.  Patient presents to the emergency department today for evaluation of abdominal pain, constipation, abdominal distention ongoing over the past couple of days.  Patient has history of kidney stones, recent diagnosis of prostate cancer, hypertension.  Patient takes bowel regimen including MiraLAX chronically.  Recently tried glycerin suppository for symptoms.  No improvement.  Patient has had nausea without vomiting.  Seen by PCP today, referred to the emergency department due to concern for bowel obstruction.  Reports poor oral intake over the past couple of days.       Home Medications Prior to Admission medications   Medication Sig Start Date End Date Taking? Authorizing Provider  albuterol (PROVENTIL HFA;VENTOLIN HFA) 108 (90 BASE) MCG/ACT inhaler Inhale 2 puffs into the lungs every 4 (four) hours as needed for wheezing or shortness of breath. 05/01/14   Cathren Laine, MD  albuterol (PROVENTIL) (2.5 MG/3ML) 0.083% nebulizer solution Take 3 mLs by nebulization every 6 hours as needed for wheezing or shortness of breath. 12/25/22   Cathlyn Parsons, NP  albuterol (VENTOLIN HFA) 108 (90 Base) MCG/ACT inhaler Inhale 2 puffs by mouth into the lungs every 4 hours as needed for cough/wheezing 06/19/21     albuterol (VENTOLIN HFA) 108 (90 Base) MCG/ACT inhaler Inhale 2 puffs by mouth into the lungs every 4 hours as needed for cough/wheezing 07/12/21     albuterol (VENTOLIN HFA) 108 (90 Base) MCG/ACT inhaler Inhale 2 puffs into the lungs every 4 (four) hours as needed for cough/wheeze 09/27/22     albuterol (VENTOLIN HFA) 108 (90 Base) MCG/ACT inhaler Inhale 2 puffs into the lungs every 4 hours as needed for cough/wheeze 10/31/22     albuterol (VENTOLIN HFA) 108 (90 Base)  MCG/ACT inhaler Inhale 2 puffs into the lungs every 4 (four) hours as needed or cough/wheeze 06/04/23     amLODipine-valsartan (EXFORGE) 10-320 MG tablet TAKE 1 TABLET BY MOUTH ONCE DAILY 09/09/20 09/09/21  Georgann Housekeeper, MD  amLODipine-valsartan (EXFORGE) 10-320 MG tablet Take 1 tablet by mouth once a day 02/28/23     amLODipine-valsartan (EXFORGE) 10-320 MG tablet Take 1 tablet by mouth once daily 10/21/21     amLODipine-valsartan (EXFORGE) 5-320 MG per tablet Take 1 tablet by mouth daily. 02/10/15   Henrietta Hoover, NP  amLODipine-valsartan (EXFORGE) 5-320 MG tablet TAKE 1 TABLET BY MOUTH DAILY 01/11/20 01/10/21  Georgann Housekeeper, MD  amoxicillin (AMOXIL) 500 MG capsule Take 1 capsule (500 mg total) by mouth 3 (three) times daily for 7 days. 12/21/22     amoxicillin-clavulanate (AUGMENTIN) 875-125 MG tablet Take 1 tablet by mouth 2 (two) times daily. 11/24/21   Wallis Bamberg, PA-C  anastrozole (ARIMIDEX) 1 MG tablet Take 2 mg by mouth daily.  08/09/16   [provider]  anastrozole (ARIMIDEX) 1 MG tablet Take 1 tablet (1 mg total) by mouth daily. 07/17/21     anastrozole (ARIMIDEX) 1 MG tablet Take 1 tablet by mouth every day. 10/16/22     azithromycin (ZITHROMAX) 250 MG tablet Take 2 tablets the first day then 1 tablet every day for 4 days Patient not taking: Reported on 12/25/2022 04/11/22     cefdinir (OMNICEF) 300 MG capsule Take 1 capsule (300 mg total) by mouth 2 (two) times daily for  10 days. 01/10/23     ciprofloxacin (CIPRO) 500 MG tablet Take 1 tablet (500 mg total) by mouth 2 (two) times daily. Patient not taking: Reported on 12/25/2022 07/27/22     clomiPHENE (CLOMID) 50 MG tablet  02/04/19   [provider]  clomiPHENE (CLOMID) 50 MG tablet Take 1 tablet (50 mg total) by mouth daily. 07/17/21     clomiPHENE (CLOMID) 50 MG tablet Take 1 tablet (50 mg total) by mouth daily. 07/17/21     clomiPHENE (CLOMID) 50 MG tablet Take 1 tablet (50 mg total) by mouth daily. 01/11/23      diclofenac (VOLTAREN) 75 MG EC tablet  01/01/19   [provider]  doxazosin (CARDURA) 8 MG tablet  02/04/19   [provider]  doxazosin (CARDURA) 8 MG tablet TAKE 1 TABLET BY MOUTH ONCE DAILY 05/30/20 05/30/21  Georgann Housekeeper, MD  doxazosin (CARDURA) 8 MG tablet Take 1 tablet by mouth once daily 04/07/21     doxazosin (CARDURA) 8 MG tablet Take 1 tablet (8 mg total) by mouth daily. 07/30/23     doxycycline (VIBRA-TABS) 100 MG tablet Take 1 tablet (100 mg total) by mouth 2 (two) times daily. Patient not taking: Reported on 12/25/2022 09/04/22     EPINEPHrine 0.3 mg/0.3 mL IJ SOAJ injection Use as directed Injection as needed for systemic reactions 05/29/21     EPINEPHrine 0.3 mg/0.3 mL IJ SOAJ injection Inject as directed as needed for systemic reaction 09/27/22     EPINEPHrine 0.3 mg/0.3 mL IJ SOAJ injection Inject as directed as needed for systemic reaction 10/30/22     fluticasone (FLONASE) 50 MCG/ACT nasal spray Place 2 sprays in each nostril Twice a day 07/12/21     fluticasone (FLONASE) 50 MCG/ACT nasal spray Spray into both nostrils twice daily. 07/12/21     fluticasone (VERAMYST) 27.5 MCG/SPRAY nasal spray PLACE 1 - 2 SPRAYS INTO EACH NOSTRIL ONCE DAILY DURING SEASONS OF DIFFICULTY 08/30/20 08/30/21  Irena Cords, Enzo Montgomery, MD  fluticasone furoate-vilanterol (BREO ELLIPTA) 200-25 MCG/ACT AEPB Inhale 1 pufff once daily into the lungs. 06/19/21     fluticasone furoate-vilanterol (BREO ELLIPTA) 200-25 MCG/ACT AEPB Inhale 1 puff by mouth into the lungs once daily 06/19/21     Fluticasone-Umeclidin-Vilant (TRELEGY ELLIPTA) 100-62.5-25 MCG/ACT AEPB Inhale 1 puff into the lungs once daily 07/21/21     Fluticasone-Umeclidin-Vilant (TRELEGY ELLIPTA) 100-62.5-25 MCG/ACT AEPB Inhale 1 puff into the lungs daily. 09/27/22     Fluticasone-Umeclidin-Vilant (TRELEGY ELLIPTA) 100-62.5-25 MCG/ACT AEPB Inhale 1 puff into the lungs daily. 06/04/23     Fluticasone-Umeclidin-Vilant (TRELEGY ELLIPTA) 100-62.5-25  MCG/INH AEPB Inhale 1 puff into the lungs once daily 04/07/21     folic acid (FOLVITE) 1 MG tablet Take 1 tablet by mouth once daily 04/07/21     folic acid (FOLVITE) 1 MG tablet Take 1 tablet (1 mg total) by mouth daily. 07/02/22     ipratropium-albuterol (DUONEB) 0.5-2.5 (3) MG/3ML SOLN Take 3 mLs by nebulization every 4 (four) hours as needed. 03/03/20   Darr, Gerilyn Pilgrim, PA-C  levocetirizine (XYZAL) 5 MG tablet Take 1 tablet (5 mg total) by mouth every evening. 11/24/21   Wallis Bamberg, PA-C  levocetirizine Elita Boone) 5 MG tablet Take 1 tablet by mouth once daily in the evening 11/24/21   Wallis Bamberg, PA-C  levocetirizine (XYZAL) 5 MG tablet Take 1 tablet (5 mg total) by mouth daily. 01/10/23     levofloxacin (LEVAQUIN) 750 MG tablet Take 1 tablet one hour prior to your scheduled procedure.  07/01/23     levothyroxine (SYNTHROID) 50 MCG tablet TAKE 1 TABLET BY MOUTH ONCE DAILY EVERY MORNING 04/28/20 04/28/21  Georgann Housekeeper, MD  levothyroxine (SYNTHROID) 50 MCG tablet TAKE 1 TABLET BY MOUTH ONCE DAILY EVERY MORNING 07/19/21     levothyroxine (SYNTHROID) 50 MCG tablet Take 1 tablet by mouth every morning. 10/21/21     levothyroxine (SYNTHROID) 50 MCG tablet Take 1 tablet (50 mcg total) by mouth every morning. 02/07/23     levothyroxine (SYNTHROID, LEVOTHROID) 50 MCG tablet TAKE 1 TABLET BY MOUTH ONCE DAILY 05/13/15   Henrietta Hoover, NP  meloxicam (MOBIC) 15 MG tablet Take 1 tablet (15 mg total) by mouth daily. 07/27/22     meloxicam (MOBIC) 15 MG tablet Take 1 tablet (15 mg total) by mouth daily as needed. 09/04/22     montelukast (SINGULAIR) 10 MG tablet  02/04/19   [provider]  montelukast (SINGULAIR) 10 MG tablet TAKE 1 TABLET BY MOUTH ONCE DAILY 08/30/20 08/30/21  Irena Cords, Enzo Montgomery, MD  montelukast (SINGULAIR) 10 MG tablet TAKE 1 TABLET BY MOUTH EVERY EVENING ONCE A DAY 08/17/20 08/17/21  Irena Cords, Enzo Montgomery, MD  montelukast (SINGULAIR) 10 MG tablet TAKE ONE TABLET BY MOUTH EVERY EVENING 08/10/20 08/10/21   Irena Cords, Enzo Montgomery, MD  montelukast (SINGULAIR) 10 MG tablet TAKE 1 TABLET BY MOUTH EVERY EVENING 01/04/20 01/03/21  Irena Cords, Enzo Montgomery, MD  montelukast (SINGULAIR) 10 MG tablet Take 1 tablet by mouth once daily 07/19/21     montelukast (SINGULAIR) 10 MG tablet Take 1 tablet by mouth daily. 07/19/21     montelukast (SINGULAIR) 10 MG tablet Take 1 tablet by mouth daily 03/19/22     montelukast (SINGULAIR) 10 MG tablet Take 1 tablet (10 mg total) by mouth daily. 09/27/22     montelukast (SINGULAIR) 10 MG tablet Take 1 tablet (10 mg total) by mouth daily. 09/01/23     omeprazole (PRILOSEC) 20 MG capsule Take 1 capsule by mouth twice a day 11/27/21     oxyCODONE-acetaminophen (PERCOCET/ROXICET) 5-325 MG tablet Take 1 tablet by mouth 3 (three) times daily. Takes for chronic back pain 08/03/15   [provider]  oxyCODONE-acetaminophen (PERCOCET/ROXICET) 5-325 MG tablet Take 1 tablet by mouth 3 times daily as needed 08/01/21     oxyCODONE-acetaminophen (PERCOCET/ROXICET) 5-325 MG tablet Take 1 tablet by mouth three times a day as needed 03/30/22     oxyCODONE-acetaminophen (PERCOCET/ROXICET) 5-325 MG tablet Take 1 tablet by mouth 3 (three) times daily as needed. 12/03/22     oxyCODONE-acetaminophen (PERCOCET/ROXICET) 5-325 MG tablet Take 1 tablet by mouth three times daily as needed. 01/07/23     oxyCODONE-acetaminophen (PERCOCET/ROXICET) 5-325 MG tablet Take 1 tablet by mouth 3 times daily as needed. 01/31/23     oxyCODONE-acetaminophen (PERCOCET/ROXICET) 5-325 MG tablet Take 1 tablet by mouth 3 (three) times daily as needed. 03/01/23     oxyCODONE-acetaminophen (PERCOCET/ROXICET) 5-325 MG tablet Take 1 tablet by mouth 3 (three) times daily as needed. 04/02/23     oxyCODONE-acetaminophen (PERCOCET/ROXICET) 5-325 MG tablet Take 1 tablet by mouth 3 (three) times daily. 04/30/23     oxyCODONE-acetaminophen (PERCOCET/ROXICET) 5-325 MG tablet Take 1 tablet by mouth 3 (three) times daily as needed. 05/30/23      oxyCODONE-acetaminophen (PERCOCET/ROXICET) 5-325 MG tablet Take 1 tablet by mouth 3 (three) times daily as needed. 07/05/23     oxyCODONE-acetaminophen (PERCOCET/ROXICET) 5-325 MG tablet Take 1 tablet by mouth 3 (three) times daily as needed. 08/03/23  oxyCODONE-acetaminophen (PERCOCET/ROXICET) 5-325 MG tablet Take 1 tablet by mouth 3 (three) times daily. 09/04/23     predniSONE (DELTASONE) 20 MG tablet Take 2 tablets daily with breakfast. 12/25/22   Cathlyn Parsons, NP  promethazine-dextromethorphan (PROMETHAZINE-DM) 6.25-15 MG/5ML syrup Take 5 mLs by mouth 3 (three) times daily. Patient not taking: Reported on 12/25/2022 11/24/21   Wallis Bamberg, PA-C  sildenafil (VIAGRA) 100 MG tablet TAKE ONE TABLET BY MOUTH AS NEEDED ONE HOUR PRIOR TO INTERCOURSE 07/21/20 07/21/21  Rene Paci, MD  sildenafil (VIAGRA) 100 MG tablet Take 1 tablet by mouth one hour prior to intercourse 07/17/21     sildenafil (VIAGRA) 100 MG tablet Take 1 tablet by mouth 1 hour prior to intercourse. 07/17/21     sildenafil (VIAGRA) 100 MG tablet Take 1 tablet (100 mg total) by mouth daily as needed. 05/30/23     sildenafil (VIAGRA) 100 MG tablet Take 1 tablet (100 mg total) by mouth one hour prior to intercourse as needed 07/01/23     tadalafil (CIALIS) 20 MG tablet TAKE 1 TABLET BY MOUTH AS NEEDED ONE HOUR PRIOR TO INTERCOURSE 01/21/20 01/20/21  Rene Paci, MD  Tiotropium Bromide Monohydrate (SPIRIVA RESPIMAT) 1.25 MCG/ACT AERS Inhale 2 puffs by mouth into the lungs once daily 06/19/21     Tiotropium Bromide Monohydrate 1.25 MCG/ACT AERS Inhale 2 puffs by mouth once daily as needed 06/19/21     TRELEGY ELLIPTA 100-62.5-25 MCG/INH AEPB Inhale 1 puff into the lungs daily. 03/30/20   [provider]      Allergies    Molds & smuts and Dust mite extract    Review of Systems   Review of Systems  Physical Exam Updated Vital Signs BP (!) 162/94 (BP Location: Right Arm)   Pulse 82   Temp 99.2 F  (37.3 C)   Resp 18   SpO2 98%   Physical Exam Vitals and nursing note reviewed.  Constitutional:      General: He is not in acute distress.    Appearance: He is well-developed.  HENT:     Head: Normocephalic and atraumatic.     Nose: Nose normal.     Mouth/Throat:     Mouth: Mucous membranes are moist.  Eyes:     General:        Right eye: No discharge.        Left eye: No discharge.     Conjunctiva/sclera: Conjunctivae normal.  Cardiovascular:     Rate and Rhythm: Normal rate and regular rhythm.     Heart sounds: Normal heart sounds.  Pulmonary:     Effort: Pulmonary effort is normal.     Breath sounds: Normal breath sounds.  Abdominal:     General: There is distension (Mild distention).     Palpations: Abdomen is soft.     Tenderness: There is no abdominal tenderness. There is no guarding or rebound.     Comments: Mild generalized tenderness, nonfocal.  Musculoskeletal:     Cervical back: Normal range of motion and neck supple.  Skin:    General: Skin is warm and dry.  Neurological:     Mental Status: He is alert.     ED Results / Procedures / Treatments   Labs (all labs ordered are listed, but only abnormal results are displayed) Labs Reviewed  CBC WITH DIFFERENTIAL/PLATELET - Abnormal; Notable for the following components:      Result Value   Hemoglobin 11.9 (*)    HCT 36.8 (*)  RDW 16.6 (*)    All other components within normal limits  COMPREHENSIVE METABOLIC PANEL - Abnormal; Notable for the following components:   Glucose, Bld 120 (*)    Creatinine, Ser 1.74 (*)    GFR, Estimated 45 (*)    All other components within normal limits  URINALYSIS, ROUTINE W REFLEX MICROSCOPIC - Abnormal; Notable for the following components:   Specific Gravity, Urine >1.046 (*)    Hgb urine dipstick LARGE (*)    Protein, ur 30 (*)    All other components within normal limits  LIPASE, BLOOD    EKG None  Radiology CT ABDOMEN PELVIS W CONTRAST Result Date:  09/10/2023 CLINICAL DATA:  Abdominal pain, acute, nonlocalized ?SBO. EXAM: CT ABDOMEN AND PELVIS WITH CONTRAST TECHNIQUE: Multidetector CT imaging of the abdomen and pelvis was performed using the standard protocol following bolus administration of intravenous contrast. RADIATION DOSE REDUCTION: This exam was performed according to the departmental dose-optimization program which includes automated exposure control, adjustment of the mA and/or kV according to patient size and/or use of iterative reconstruction technique. CONTRAST:  75mL OMNIPAQUE IOHEXOL 300 MG/ML  SOLN COMPARISON:  CT scan renal stone protocol from 03/05/2015. FINDINGS: Lower chest: The lung bases are clear. There is trace right pleural effusion. No left pleural effusion. The heart is normal in size. No pericardial effusion. Hepatobiliary: The liver is normal in size. Non-cirrhotic configuration. No suspicious mass. These is marked diffuse hepatic steatosis. No intrahepatic or extrahepatic bile duct dilation. No calcified gallstones. Normal gallbladder wall thickness. No pericholecystic inflammatory changes. Pancreas: Unremarkable. No pancreatic ductal dilatation or surrounding inflammatory changes. Spleen: Within normal limits. No focal lesion. Adrenals/Urinary Tract: Adrenal glands are unremarkable. There is a 5.5 by 8 mm left upper ureteric calculus causing moderate proximal hydronephrosis. There is also relative hypoattenuation of left kidney in comparison to the right, suggesting acute obstructive uropathy. There are additional approximately 10, sub 4 mm calculi in the left kidney. There are several simple cysts in the left kidney with largest in the interpolar region, laterally measuring up to 1.5 x 1.8 cm. Left ureter distal to the calculus is nondilated. There are approximately 10 nonobstructive calculi in the right kidney with largest measuring up to 7 mm. No right ureterolithiasis or obstructive uropathy. There are multiple subcentimeter  hypoattenuating foci in the right kidney, which are too small to adequately characterize. Urinary bladder is under distended, precluding optimal assessment. However, no large mass or stones identified. No perivesical fat stranding. Stomach/Bowel: No disproportionate dilation of the small or large bowel loops. No evidence of abnormal bowel wall thickening or inflammatory changes. The appendix is unremarkable. There is protrusion of the tip of the appendix into the right inguinal hernia. There is liquid unformed stool throughout the colon, nonspecific but can be seen with diarrhea. Vascular/Lymphatic: No ascites or pneumoperitoneum. No abdominal or pelvic lymphadenopathy, by size criteria. No aneurysmal dilation of the major abdominal arteries. Reproductive: Enlarged prostate. Symmetric seminal vesicles. Other: There is small fat containing umbilical hernia and a tiny fat containing left inguinal hernia. There is also a small right inguinal hernia containing ascitic fluid and portion of appendix. The soft tissues and abdominal wall are otherwise unremarkable. Musculoskeletal: No suspicious osseous lesions. There are mild multilevel degenerative changes in the visualized spine. IMPRESSION: 1. No bowel obstruction. No acute inflammatory process identified within the abdomen or pelvis. 2. There is a 5.5 x 8 mm left upper ureteric calculus causing moderate to marked proximal obstructive uropathy. 3. There are multiple bilateral  additional renal calculi. No right obstructive uropathy. 4. Multiple other nonacute observations, as described above. Electronically Signed   By: Jules Schick M.D.   On: 09/10/2023 16:25    Procedures Procedures    Medications Ordered in ED Medications  sodium chloride 0.9 % bolus 1,000 mL (has no administration in time range)  metoCLOPramide (REGLAN) injection 10 mg (has no administration in time range)  ketorolac (TORADOL) 15 MG/ML injection 15 mg (has no administration in time  range)  dicyclomine (BENTYL) capsule 10 mg (has no administration in time range)  iohexol (OMNIPAQUE) 300 MG/ML solution 100 mL (75 mLs Intravenous Contrast Given 09/10/23 1440)    ED Course/ Medical Decision Making/ A&P    Patient seen and examined. History obtained directly from patient. Work-up including labs, imaging, EKG ordered in triage, if performed, were reviewed.    Labs/EKG: Independently reviewed and interpreted.  This included: Complete blood cell count, normal white blood cell count, mild low hemoglobin at 11.9; CMP demonstrates elevated creatinine at 1.74 with BUN of 17; lipase normal.  Imaging: Independently visualized and interpreted.  This included: CT abdomen pelvis, no evidence for bowel obstruction, patient does have a 5.5 x 8 mm left upper ureteral calculus causing moderate hydronephrosis.  Medications/Fluids: Ordered: IV Reglan, IV fluid bolus, IV Toradol, p.o. Bentyl  Most recent vital signs reviewed and are as follows: BP (!) 162/94 (BP Location: Right Arm)   Pulse 82   Temp 99.2 F (37.3 C)   Resp 18   SpO2 98%   Initial impression: Patient with symptoms of ileus, no evidence for obstruction on CT, with left ureteral calculus likely contributing to the patient's symptoms.  6:44 PM Reassessment performed. Patient appears more comfortable.  Feels better after IV fluids and medication.  He has tolerated some sips of water.  No vomiting.  Pain improved.  Labs personally reviewed and interpreted including: UA with blood as expected given known kidney stone, concentrated consistent with some dehydration.  Reviewed pertinent lab work and imaging with patient at bedside. Questions answered.   Most current vital signs reviewed and are as follows: BP (!) 148/72 (BP Location: Right Arm)   Pulse (!) 57   Temp 99.1 F (37.3 C) (Oral)   Resp 18   SpO2 96%   Plan: Discharge to home.   Prescriptions written for: Bisacodyl suppository for constipation, Reglan for  nausea as needed  Other home care instructions discussed: Continue hydration and home MiraLAX as well as chronic medications.  Patient counseled on kidney stone treatment. Urged patient to strain urine and save any stones. Counseled patient to maintain good fluid intake.                                 Medical Decision Making Risk OTC drugs. Prescription drug management.   For this patient's complaint of abdominal pain, the following conditions were considered on the differential diagnosis: gastritis/PUD, enteritis/duodenitis, appendicitis, cholelithiasis/cholecystitis, cholangitis, pancreatitis, ruptured viscus, colitis, diverticulitis, small/large bowel obstruction, proctitis, cystitis, pyelonephritis, ureteral colic, aortic dissection, aortic aneurysm. Atypical chest etiologies were also considered including ACS, PE, and pneumonia.  Initial concern today was for small bowel obstruction.  CT does not show this.  I do suspect the patient has a bit of an ileus, possibly medication related or less likely related to irritation from ureteral colic.  Patient was treated for kidney stone in the ED and had improvement.  He was also likely dehydrated with  some elevation in his creatinine.  This was treated with IV fluids.  Patient will need to have this rechecked in about a week.  He is already well-established with urology given recent diagnosis of prostate cancer which is currently being sorted out.  Patient and wife are comfortable with discharge home at this time.  For constipation, patient will continue MiraLAX.  Will add bisacodyl.  No evidence of impaction today.  Will give Reglan for nausea.   The patient's vital signs, pertinent lab work and imaging were reviewed and interpreted as discussed in the ED course. Hospitalization was considered for further testing, treatments, or serial exams/observation. However as patient is well-appearing, has a stable exam, and reassuring studies today, I do not  feel that they warrant admission at this time. This plan was discussed with the patient who verbalizes agreement and comfort with this plan and seems reliable and able to return to the Emergency Department with worsening or changing symptoms.          Final Clinical Impression(s) / ED Diagnoses Final diagnoses:  Ureteral colic  Constipation, unspecified constipation type    Rx / DC Orders ED Discharge Orders          Ordered    metoCLOPramide (REGLAN) 10 MG tablet  Every 6 hours PRN        09/10/23 1841    bisacodyl (DULCOLAX) 10 MG suppository  As needed        09/10/23 1841              Renne Crigler, PA-C 09/10/23 Cecille Aver, MD 09/12/23 1339

## 2023-09-10 NOTE — ED Notes (Signed)
Discharge paperwork given and verbally understood.

## 2023-09-10 NOTE — Discharge Instructions (Addendum)
Please read and follow all provided instructions.  Your diagnoses today include:  1. Ureteral colic   2. Constipation, unspecified constipation type     Tests performed today include: Urine test that showed blood in your urine and no infection CT scan which showed a 6mm x 8mm millimeter kidney stone on the left side Blood test that showed elevated kidney function -- will need to be rechecked by your doctor in 1 week Vital signs. See below for your results today.   Medications prescribed:  Metoclopramide: Medication for nausea and vomiting  Dulcolax suppository: Medication for constipation  Take any prescribed medications only as directed.  Home care instructions:  Follow any educational materials contained in this packet.  Please double your fluid intake for the next several days. Strain your urine and save any stones that may pass.   BE VERY CAREFUL not to take multiple medicines containing Tylenol (also called acetaminophen). Doing so can lead to an overdose which can damage your liver and cause liver failure and possibly death.   Follow-up instructions: Please follow-up with your urologist in the next 1 week for further evaluation of your symptoms.  Return instructions:  Please return to the Emergency Department if you experience worsening symptoms.  Please return if you develop fever or uncontrolled pain or vomiting. Please return if you have any other emergent concerns.  Additional Information:  Your vital signs today were: BP (!) 148/72 (BP Location: Right Arm)   Pulse (!) 57   Temp 99.1 F (37.3 C) (Oral)   Resp 18   SpO2 96%  If your blood pressure (BP) was elevated above 135/85 this visit, please have this repeated by your doctor within one month. --------------

## 2023-09-10 NOTE — ED Notes (Signed)
Pt aware of the need for a urine... Unable to currently provide the sample.Marland KitchenMarland Kitchen

## 2023-09-10 NOTE — ED Notes (Signed)
Patient notified of need for urine sample to complete eval/assessment. Specimen cup provided and instructions for clean catch given. Patient will notify staff when able to provide

## 2023-09-10 NOTE — ED Triage Notes (Signed)
No BM no passing gas x 2 days Has been treating with OTC, including suppository no results Seen by DR Eula Listen at [place]. Dr Eula Listen called concerned with obstruction

## 2023-09-12 ENCOUNTER — Other Ambulatory Visit: Payer: Self-pay | Admitting: Urology

## 2023-09-12 DIAGNOSIS — N202 Calculus of kidney with calculus of ureter: Secondary | ICD-10-CM | POA: Diagnosis not present

## 2023-09-12 DIAGNOSIS — R8271 Bacteriuria: Secondary | ICD-10-CM | POA: Diagnosis not present

## 2023-09-12 NOTE — H&P (Signed)
Office Visit Report     09/12/2023   --------------------------------------------------------------------------------   Nicholas Mcintosh  MRN: [id number]  DOB: 01-02-1966, 58 year old Male  SSN: [id number]   PRIMARY CARE:  Georgann Housekeeper, MD  PRIMARY CARE FAX:  [phone]  REFERRING:  Cristal Deer A. Liliane Shi, MD  PROVIDER:  Rhoderick Moody, M.D.  LOCATION:  [facility] [phone]     --------------------------------------------------------------------------------   CC/HPI: CC: Kidney stones   HPI: Nicholas Mcintosh is a 58 year old male with a T1c, grade 2 prostate cancer, erectile dysfunction, hypogonadism, microscopic hematuria and kidney stones.   Last PSA- 3.08 (05/2023), 3.34 (12/2022), 2.89 (07/2022), 2.33 (06/2022), 1.56 (12/2021), 1.63 (06/2020), 1.84 (12/2019), 1.99 (06/2019), 1.4 (06/2018), 1.06 (07/2017)   09/12/2023: The patient was seen in the emergency department on 09/10/2023 due to acute left-sided flank pain. He was found to have a 5 mm left UPJ stone with mild to moderate hydronephrosis. Serum creatinine in the ER was found to be 1.74 (baseline is less than 1). WBC was 9.7 and UA was positive for blood but no signs of UTI. I spoke with the patient on the phone the morning of to 1125 regarding his prostate biopsy results, which revealed multifocal grades 1 and 2 prostate cancer. Consultations to discuss definitive treatment are pending. Currently, the patient reports intermittent episodes of sharp left-sided flank pain associated with generalized abdominal pain. He states that he has not had a bowel movement in 3 days despite multiple stool softeners. He denies any nausea/vomiting, fever/chills, dysuria or gross hematuria. AFVSS today.     ALLERGIES: Allopurinol TABS    MEDICATIONS: Anastrozole 1 mg tablet Take 1 tablet by mouth every day.  Clomid 50 mg tablet 1 tablet PO Daily  Levofloxacin 750 mg tablet 1 tablet PO As Directed Take one hour prior to your  scheduled procedure.  Levothyroxine Sodium  Anastrozole  Doxazosin Mesylate  Exforge Hct  Garlic  Oxycodone-Acetaminophen 5 mg-325 mg tablet  Sildenafil Citrate 100 mg tablet Take one tablet PO PRN one hour prior to intercourse  Trelegy Ellipta  Vitamin D2     Notes: pt mentioned he just started allergy shots recently Qwk   GU PSH: Cysto Uretero Lithotripsy - 2016 Cystoscopy Insert Stent - 2016 ESWL - 2019, 2016 Prostate Needle Biopsy - 08/26/2023 Varicocele repair - 2009       PSH Notes: Renal Lithotripsy, Cystoscopy With Ureteroscopy With Lithotripsy, Cystoscopy With Insertion Of Ureteral Stent Left, Surgery Spermatic Cord Excision Of Varicocele   NON-GU PSH: Surgical Pathology, Gross And Microscopic Examination For Prostate Needle - 08/26/2023 Visit Complexity (formerly GPC1X) - 07/01/2023     GU PMH: Elevated PSA - 08/26/2023, - 07/01/2023, - 01/01/2023, - 07/26/2022 ED due to arterial insufficiency - 07/01/2023, - 01/01/2023, - 07/17/2021, Erectile dysfunction due to arterial insufficiency, - 2015 Primary hypogonadism - 07/01/2023, - 01/01/2023, - 07/26/2022, - 07/17/2021, - 2022, - 2021, - 2018, - 2017, Hypogonadism, testicular, - 2016 Renal calculus, Bilateral - 07/01/2023, Bilateral, - 07/26/2022, Bilateral, - 07/17/2021, Bilateral, - 2022, Bilateral, - 2021 (Stable), Bilateral, - 2019, - 2017, Nephrolithiasis, - 2017, Left nephrolithiasis, - 2016, Right nephrolithiasis, - 2016 BPH w/LUTS - 01/01/2023, - 09/04/2022, - 07/26/2022, Benign localized hyperplasia of prostate with urinary obstruction, - 2016 Dysuria - 01/01/2023, - 09/04/2022 Pelvic/perineal pain - 09/04/2022 Flank Pain - 07/26/2022 BPH w/o LUTS - 2021, - 2019 Encounter for Prostate Cancer screening - 2019, Prostate cancer screening, - 2016 Weak Urinary Stream (Stable) - 2019 History of urolithiasis (Stable), Bilateral  nonobstructing renal stones - 2018, Nephrolithiasis, - 2014 Renal and ureteral calculus - 2018, - 2017 Ureteral  calculus - 2018, - 2017, Left ureteral stone, - 2016, Right ureteral stone, - 2016 Other microscopic hematuria (Stable) - 2017, Microscopic hematuria, - 2016 Gross hematuria, Gross hematuria - 2016 Urinary Retention, Unspec, Incomplete bladder emptying - 2016 Nocturia, Nocturia - 2016 Ureteral stricture, Ureteral obstruction - 2016 Unil Inguinal Hernia W/O obst or gang,non-recurrent, Right inguinal hernia - 2016 Urinary Tract Inf, Unspec site, Pyuria - 2015, Urinary tract infection, - 2014 Epididymitis, Epididymitis - 2014 Epididymo-orchitis, Orchitis and epididymitis - 2014 Varicocele, Scrotal Varicocele - 2014      PMH Notes: . Kidney stones- he is s/p right ESWL on 02/03/18. Denies interval episodes of flank pain or stone passage   2. Hypogonadism- Currently on anastrozole 1 mg daily and Clomid 50 mg daily. He reports sustained improvement in his energy and libido. Continues to use CPAP for OSA.   3. ED- Currently using Cialis 20 mg with marginal improvement in his erectile quality.   4. Microscopic hematuria- Negative CT and cysto in 2018. From a urinary standpoint, he reports a good FOS and feels like he is emptying his bladder well. He has occasional urgency/frequency, but is not bothered by it. Nocturia x 1.   07/17/2021: 58 year old male who presents today for evaluation and management of erectile dysfunction, hypogonadism and urolithiasis. He is currently on anastrozole and Clomid and tolerating these well although he does experience some night sweats at times. He is using sildenafil for erectile dysfunction and finds this somewhat beneficial although he is not having the results he would like. He feels he empties his bladder appropriately. He denies any interval flank pain or passage of stone material. His lab work is all within normal range.   07/26/2022: 58 year old man who presents for evaluation and management of urolithiasis and hypogonadism. He is currently on anastrozole and  Clomid and tolerating these well. He continues the use of sildenafil for erectile dysfunction and finds it somewhat beneficial. He reports onset of right-sided flank pain and discomfort that began a few days ago and is progressively worsening. He has blood in his urine and a history of urolithiasis. On his KUB today his stones appear stable. Has not had any significant changes to his voiding habits other than some intermittent urgency.   09/04/2022: Mr. Mozer is a 58 year old man who is currently on anastrozole and Clomid. He presents today for follow up. He is doing well and denies fevers and chills. He does endorse some pelvic pain is slight but presents associated with mild dysuria. He denies flank pain, gross hematuria   01/01/2023: Mr. Weiand is a 58 year old man who is currently managed for hypogonadism with anastrozole and Clomid. He is doing well with these medications. From a urinary standpoint he reports a good flow of stream and feels he like he is emptying his bladder appropriately. He is using sildenafil as needed for erectile dysfunction and finds this to be somewhat beneficial although feels it could be better. In regards to nephrolithiasis, he has had no flank pain or gross hematuria.   07/01/23: The patient is here today for a routine follow-up. PSA remains stable. mpMRI shows a PI-RADS 4 lesions involving the right posterolateral peripheral zone at the apex. He is voiding w/o difficulty and denies interval UTIs, dysuria or hematuria. He continues to take anastrozole and clomid with sustained improvement in his energy and libido. Still using sildenafil 50 mg PRN for ED and  notes an adequate erection.   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  Does not drink anymore.  Does not drink caffeine. Patient's occupation Information systems manager.     Notes: Never A Smoker, Occupation:, Caffeine Use, Marital History - Currently Married, Tobacco Use, Alcohol Use   REVIEW OF SYSTEMS:    GU Review Male:   Patient denies frequent urination, hard to postpone urination, burning/ pain with urination, get up at night to urinate, leakage of urine, stream starts and stops, trouble starting your stream, have to strain to urinate , erection problems, and penile pain.  Gastrointestinal (Upper):   Patient denies nausea, vomiting, and indigestion/ heartburn.  Gastrointestinal (Lower):   Patient reports constipation. Patient denies diarrhea.  Constitutional:   Patient denies fever, night sweats, weight loss, and fatigue.  Skin:   Patient denies skin rash/ lesion and itching.  Eyes:   Patient denies blurred vision and double vision.  Ears/ Nose/ Throat:   Patient denies sore throat and sinus problems.  Hematologic/Lymphatic:   Patient denies swollen glands and easy bruising.  Cardiovascular:   Patient denies leg swelling and chest pains.  Respiratory:   Patient denies cough and shortness of breath.  Endocrine:   Patient denies excessive thirst.  Musculoskeletal:   Patient denies back pain and joint pain.  Neurological:   Patient denies headaches and dizziness.  Psychologic:   Patient denies depression and anxiety.   VITAL SIGNS:      09/12/2023 01:04 PM  BP 166/87 mmHg  Pulse 65 /min   Temperature 99.0 F / 37.2 C   MULTI-SYSTEM PHYSICAL EXAMINATION:    Constitutional: Well-nourished. No physical deformities. Normally developed. Good grooming. No acute distress  Neurologic / Psychiatric: Oriented to time, oriented to place, oriented to person. No depression, no anxiety, no agitation.     Complexity of Data:  Lab Test Review:   BMP, CBC with Diff  Records Review:   Pathology Reports, Previous Hospital Records, Previous Patient Records  Urine Test Review:   Urinalysis  X-Ray Review: KUB: Reviewed Films. Discussed With Patient.  C.T. Abdomen/Pelvis: Reviewed Films. Reviewed Report. Discussed With Patient.     06/25/23 01/01/23 08/28/22 07/10/22 01/08/22 07/03/21 07/14/20 01/14/20  PSA  Total PSA 3.08 ng/ml 3.34 ng/mL 2.89 ng/mL 2.33 ng/mL 1.56 ng/mL 0.90 ng/mL 1.63 ng/mL 1.84 ng/mL    01/01/23 07/10/22 01/08/22 07/03/21 01/11/21 07/14/20 01/14/20 10/20/19  Hormones  Testosterone, Total 510.6 ng/dL 829.5 ng/dL 621.3 ng/dL 086.5 ng/dL 784.6 ng/dL 962.9 ng/dL 528.4 ng/dL 132.4 ng/dL   Notes:                     CLINICAL DATA: Abdominal pain, acute, nonlocalized ?SBO.   EXAM:  CT ABDOMEN AND PELVIS WITH CONTRAST   TECHNIQUE:  Multidetector CT imaging of the abdomen and pelvis was performed  using the standard protocol following bolus administration of  intravenous contrast.   RADIATION DOSE REDUCTION: This exam was performed according to the  departmental dose-optimization program which includes automated  exposure control, adjustment of the mA and/or kV according to  patient size and/or use of iterative reconstruction technique.   CONTRAST: 75mL OMNIPAQUE IOHEXOL 300 MG/ML SOLN   COMPARISON: CT scan renal stone protocol from 03/05/2015.   FINDINGS:  Lower chest: The lung bases are clear. There is trace right pleural  effusion. No left pleural effusion. The heart is normal in size. No  pericardial effusion.   Hepatobiliary: The liver is normal in size.  Non-cirrhotic  configuration. No suspicious mass. These is marked diffuse hepatic  steatosis. No intrahepatic or extrahepatic bile duct dilation. No  calcified gallstones. Normal gallbladder wall thickness. No  pericholecystic inflammatory changes.   Pancreas: Unremarkable. No pancreatic ductal dilatation or  surrounding inflammatory changes.   Spleen: Within normal limits. No focal lesion.   Adrenals/Urinary Tract: Adrenal glands are unremarkable. There is a  5.5 by 8 mm left upper ureteric calculus causing moderate proximal  hydronephrosis. There is also relative hypoattenuation of left  kidney in comparison to the right, suggesting acute obstructive  uropathy. There are additional approximately 10, sub 4 mm calculi in  the left kidney. There are several simple cysts in the left kidney  with largest in the interpolar region, laterally measuring up to 1.5  x 1.8 cm. Left ureter distal to the calculus is nondilated.   There are approximately 10 nonobstructive calculi in the right  kidney with largest measuring up to 7 mm. No right ureterolithiasis  or obstructive uropathy. There are multiple subcentimeter  hypoattenuating foci in the right kidney, which are too small to  adequately characterize. Urinary bladder is under distended,  precluding optimal assessment. However, no large mass or stones  identified. No perivesical fat stranding.   Stomach/Bowel: No disproportionate dilation of the small or large  bowel loops. No evidence of abnormal bowel wall thickening or  inflammatory changes. The appendix is unremarkable. There is  protrusion of the tip of the appendix into the right inguinal  hernia. There is liquid unformed stool throughout the colon,  nonspecific but can be seen with diarrhea.   Vascular/Lymphatic: No ascites or pneumoperitoneum. No abdominal or  pelvic lymphadenopathy, by size criteria. No aneurysmal dilation of  the major abdominal arteries.   Reproductive:  Enlarged prostate. Symmetric seminal vesicles.   Other: There is small fat containing umbilical hernia and a tiny fat  containing left inguinal hernia. There is also a small right  inguinal hernia containing ascitic fluid and portion of appendix.  The soft tissues and abdominal wall are otherwise unremarkable.   Musculoskeletal: No suspicious osseous lesions. There are mild  multilevel degenerative changes in the visualized spine.   IMPRESSION:  1. No bowel obstruction. No acute inflammatory process identified  within the abdomen or pelvis.  2. There is a 5.5 x 8 mm left upper ureteric calculus causing  moderate to marked proximal obstructive uropathy.  3. There are multiple bilateral additional renal calculi. No right  obstructive uropathy.  4. Multiple other nonacute observations, as described above.    Electronically Signed  By: Jules Schick M.D.  On: 09/10/2023 16:25      PROCEDURES:         KUB - 74018  A single view of the abdomen is obtained. Patient confirmed No Neulasta OnPro Device.        There is an 8 x 5 mm calculus along the expected  course of the proximal left ureter, consistent with the obstructing stone seen on recent CT scan. The left renal shadow was obscured by bowel gas. There are no calcifications seen within the right renal shadow or along the expected course of the right ureter. No bladder calcifications are seen. The colon does not appear to be overtly dilated.         Urinalysis w/Scope Dipstick Dipstick Cont'd Micro  Color: Yellow Bilirubin: Neg mg/dL WBC/hpf: 0 - 5/hpf  Appearance: Clear Ketones: Neg mg/dL RBC/hpf: 0 - 2/hpf  Specific Gravity: 1.015 Blood: 2+ ery/uL Bacteria: Few (10-25/hpf)  pH: 6.0 Protein: 1+ mg/dL Cystals: NS (Not Seen)  Glucose: Neg mg/dL Urobilinogen: 0.2 mg/dL Casts: NS (Not Seen)    Nitrites: Neg Trichomonas: Not Present    Leukocyte Esterase: Neg leu/uL Mucous: Present      Epithelial Cells: NS (Not Seen)      Yeast:  NS (Not Seen)      Sperm: Not Present    ASSESSMENT:      ICD-10 Details  1 GU:   Renal and ureteral calculus - N20.2 Left, Chronic, Worsening  2   Prostate Cancer - C61 Undiagnosed New Problem   PLAN:           Orders Labs Urine Culture  X-Rays: KUB          Schedule Return Visit/Planned Activity: Next Available Appointment - Schedule Surgery  Return Visit/Planned Activity: Keep Scheduled Appointment          Document Letter(s):  Created for Patient: Clinical Summary   Created for Georgann Housekeeper, MD         Notes:   The risks, benefits and alternatives of LEFT ESWL was discussed with the patient. I described the risks which include arrhythmia, kidney contusion, kidney hemorrhage, need for transfusion, back discomfort, flank ecchymosis, flank abrasion, inability to break up stone, inability to pass stone fragments, Steinstrasse, infection associated with obstructing stones, need for different surgical procedure and possible need for repeat shockwave lithotripsy. The patient voices understanding and wishes to proceed.   The patient was counseled about the natural history of prostate cancer and the standard treatment options that are available for prostate cancer. It was explained to him how his age and life expectancy, clinical stage, Gleason score, and PSA affect his prognosis, the decision to proceed with additional staging studies, as well as how that information influences recommended treatment strategies. We discussed the roles for active surveillance, radiation therapy, surgical therapy, androgen deprivation, as well as ablative therapy options for the treatment of prostate cancer as appropriate to his individual cancer situation. We discussed the risks and benefits of these options with regard to their impact on cancer control and also in terms of potential adverse events, complications, and impact on quality of life particularly related to urinary and sexual function. The patient  was encouraged to ask questions throughout the discussion today and all questions were answered to his stated satisfaction. In addition, the patient was provided with and/or directed to appropriate resources and literature for further education about prostate cancer and treatment options.   Keep scheduled appointment with Dr. Laverle Patter on 10/04/2023 to discuss surgical options to address his favorable intermediate risk prostate cancer. Consultation with Dr. Kathrynn Running pending.        Next Appointment:      Next Appointment: 10/04/2023 11:15 AM    Appointment Type: 60 Minute Consult [place]    Location: [facility] [phone]    Provider:  Heloise Purpura, M.D.    Reason for Visit: rad prostatectomy Cons      * Signed by Rhoderick Moody, M.D. on 09/12/23 at 1:46 PM (EST)*     ADD: URINE CX NO GROWTH

## 2023-09-13 ENCOUNTER — Ambulatory Visit (HOSPITAL_COMMUNITY): Payer: Commercial Managed Care - PPO

## 2023-09-13 ENCOUNTER — Other Ambulatory Visit (HOSPITAL_COMMUNITY): Payer: Self-pay

## 2023-09-13 ENCOUNTER — Ambulatory Visit (HOSPITAL_BASED_OUTPATIENT_CLINIC_OR_DEPARTMENT_OTHER)
Admission: RE | Admit: 2023-09-13 | Discharge: 2023-09-13 | Disposition: A | Discharge status: Home / Self Care | Payer: Commercial Managed Care - PPO | Attending: Urology | Admitting: Urology

## 2023-09-13 ENCOUNTER — Encounter (HOSPITAL_BASED_OUTPATIENT_CLINIC_OR_DEPARTMENT_OTHER)
Admission: RE | Disposition: A | Discharge status: Home / Self Care | Payer: Self-pay | Source: Home / Self Care | Attending: Urology

## 2023-09-13 ENCOUNTER — Encounter (HOSPITAL_BASED_OUTPATIENT_CLINIC_OR_DEPARTMENT_OTHER): Payer: Self-pay | Admitting: Urology

## 2023-09-13 DIAGNOSIS — C61 Malignant neoplasm of prostate: Secondary | ICD-10-CM | POA: Diagnosis not present

## 2023-09-13 DIAGNOSIS — N202 Calculus of kidney with calculus of ureter: Secondary | ICD-10-CM | POA: Diagnosis not present

## 2023-09-13 DIAGNOSIS — N529 Male erectile dysfunction, unspecified: Secondary | ICD-10-CM | POA: Insufficient documentation

## 2023-09-13 DIAGNOSIS — N2889 Other specified disorders of kidney and ureter: Secondary | ICD-10-CM | POA: Diagnosis not present

## 2023-09-13 DIAGNOSIS — Z79811 Long term (current) use of aromatase inhibitors: Secondary | ICD-10-CM | POA: Insufficient documentation

## 2023-09-13 DIAGNOSIS — N132 Hydronephrosis with renal and ureteral calculous obstruction: Secondary | ICD-10-CM | POA: Diagnosis not present

## 2023-09-13 DIAGNOSIS — N201 Calculus of ureter: Secondary | ICD-10-CM

## 2023-09-13 HISTORY — PX: EXTRACORPOREAL SHOCK WAVE LITHOTRIPSY: SHX1557

## 2023-09-13 SURGERY — LITHOTRIPSY, ESWL
Anesthesia: LOCAL | Laterality: Left

## 2023-09-13 MED ORDER — MELOXICAM 15 MG PO TABS: 15.0000 mg | ORAL_TABLET | Freq: Every day | ORAL | Status: AC | PRN

## 2023-09-13 MED ORDER — CIPROFLOXACIN HCL 500 MG PO TABS
ORAL_TABLET | ORAL | Status: AC
  Filled 2023-09-13: qty 1

## 2023-09-13 MED ORDER — SODIUM CHLORIDE 0.9 % IV SOLN: INTRAVENOUS | Status: DC

## 2023-09-13 MED ORDER — MELOXICAM 15 MG PO TABS
15.0000 mg | ORAL_TABLET | Freq: Every day | ORAL | Status: DC
Start: 2023-09-15 — End: 2023-09-17

## 2023-09-13 MED ORDER — CIPROFLOXACIN HCL 500 MG PO TABS
500.0000 mg | ORAL_TABLET | ORAL | Status: AC
Start: 2023-09-13 — End: 2023-09-13
  Administered 2023-09-13: 500 mg via ORAL

## 2023-09-13 MED ORDER — DIAZEPAM 5 MG PO TABS
ORAL_TABLET | ORAL | Status: AC
  Filled 2023-09-13: qty 2

## 2023-09-13 MED ORDER — DIPHENHYDRAMINE HCL 25 MG PO CAPS
25.0000 mg | ORAL_CAPSULE | ORAL | Status: AC
  Administered 2023-09-13: 25 mg via ORAL

## 2023-09-13 MED ORDER — DIPHENHYDRAMINE HCL 25 MG PO CAPS
ORAL_CAPSULE | ORAL | Status: AC
  Filled 2023-09-13: qty 1

## 2023-09-13 MED ORDER — DIAZEPAM 5 MG PO TABS
10.0000 mg | ORAL_TABLET | ORAL | Status: AC
  Administered 2023-09-13: 10 mg via ORAL

## 2023-09-13 MED ORDER — LINZESS 145 MCG PO CAPS
145.0000 ug | ORAL_CAPSULE | Freq: Every day | ORAL | 0 refills | Status: DC
  Filled 2023-09-13: qty 30, 30d supply, fill #0

## 2023-09-13 NOTE — Op Note (Signed)
8 mm left proximal stone   Left ESWL   Findings: stone faded and fragmented well. See full [facility] op note. Pt may need a staged procedure if he fails to pass the stone or stone fragments.

## 2023-09-13 NOTE — Interval H&P Note (Signed)
History and Physical Interval Note:  09/13/2023 12:15 PM  Nicholas Mcintosh  has presented today for surgery, with the diagnosis of LEFT URETERAL STONE.  The various methods of treatment have been discussed with the patient and family. After consideration of risks, benefits and other options for treatment, the patient has consented to  Procedure(s): LEFT EXTRACORPOREAL SHOCK WAVE LITHOTRIPSY (ESWL) (Left) as a surgical intervention.  The patient's history has been reviewed, patient examined, no change in status, stable for surgery.  I have reviewed the patient's chart and labs. He has had no cough, cold, congestion, fever or chills. No dysuria or gross hematuria.  Questions were answered to the patient's satisfaction.     Jerilee Field

## 2023-09-13 NOTE — Discharge Instructions (Signed)

## 2023-09-14 ENCOUNTER — Encounter (HOSPITAL_BASED_OUTPATIENT_CLINIC_OR_DEPARTMENT_OTHER): Payer: Self-pay | Admitting: Urology

## 2023-09-16 DIAGNOSIS — J301 Allergic rhinitis due to pollen: Secondary | ICD-10-CM | POA: Diagnosis not present

## 2023-09-16 DIAGNOSIS — E039 Hypothyroidism, unspecified: Secondary | ICD-10-CM | POA: Diagnosis not present

## 2023-09-16 DIAGNOSIS — E782 Mixed hyperlipidemia: Secondary | ICD-10-CM | POA: Diagnosis not present

## 2023-09-16 DIAGNOSIS — N529 Male erectile dysfunction, unspecified: Secondary | ICD-10-CM | POA: Diagnosis not present

## 2023-09-16 DIAGNOSIS — I10 Essential (primary) hypertension: Secondary | ICD-10-CM | POA: Diagnosis not present

## 2023-09-16 DIAGNOSIS — J3089 Other allergic rhinitis: Secondary | ICD-10-CM | POA: Diagnosis not present

## 2023-09-16 DIAGNOSIS — J3081 Allergic rhinitis due to animal (cat) (dog) hair and dander: Secondary | ICD-10-CM | POA: Diagnosis not present

## 2023-09-17 NOTE — Progress Notes (Signed)
 GU Location of Tumor / Histology:  Prostate Ca  If Prostate Cancer, Gleason Score is (3 + 4) and PSA is (3.08 on 06/25/2023)  [facility] presented as referral from Dr. Cristal Deer Winter([facility]) for elevated PSA.  Biopsies     06/22/2023 Bartholomew Crews, NP MR Prostate with/without Contrast CLINICAL DATA: PSA level of 3.34. R97.20   IMPRESSION: 1. PI-RADS category 4 lesion of the right posterolateral peripheral zone at the apex. Targeting data sent to [facility]. 2. Prostatomegaly and benign prostatic hypertrophy.  Past/Anticipated interventions by urology, if any: NA  Past/Anticipated interventions by medical oncology, if any: NA  Weight changes, if any:  No  IPSS:  17 SHIM:  9  Bowel/Bladder complaints, if any:  Yes, Constipation (inflamed colon) non-obstruction bowel.  Left kidney stone.  Nausea/Vomiting, if any: No  Pain issues, if any:  7/10 back, left leg, and groin.  SAFETY ISSUES: Prior radiation?  No Pacemaker/ICD? No Possible current pregnancy? Male Is the patient on methotrexate? No  Current Complaints / other details:  Recently found to have an obstructing ureteral stone and required ESWL on 09/13/23 under Dr. Mena Goes

## 2023-09-19 NOTE — Progress Notes (Signed)
 Radiation Oncology         [phone]) [phone] ________________________________  Initial Outpatient Consultation  Name: Nicholas Mcintosh MRN: [id number]  Date: 09/23/2023  DOB: Jan 22, 1966  WU:[id number], [name], MD  Shelly Rubenstein*   REFERRING PHYSICIAN: Shelly Rubenstein*  DIAGNOSIS: 58 y.o. gentleman with Stage T1c adenocarcinoma of the prostate with Gleason score of 3+4, and PSA of 3.08.    ICD-10-CM   1. Malignant neoplasm of prostate (HCC)  C61       HISTORY OF PRESENT ILLNESS: Nicholas Mcintosh is a 58 y.o. male with a diagnosis of prostate cancer. He has been followed by urology for a history of BPH, erectile dysfunction, hypogonadism, and bilateral nephrolithiasis. He was noted to have a rising PSA-- previously under 2 through 12/2021, and rising in subsequent levels to 3.34 by 12/2022. This prompted a prostate MRI on 06/22/23 showing: PI-RADS 4 lesion of right posterolateral peripheral zone at apex. A repeat PSA obtained several days later showed a slight drop to 3.08. The patient proceeded to MRI fusion biopsy of the prostate on 08/26/23 under Dr. Liliane Shi.  The prostate volume measured 82 cc.  Out of 12 core biopsies, 8 were positive.  The maximum Gleason score was 3+4, and this was seen in all four cores from ROI (with perineural invasion), left apex lateral, and right apex lateral. Additionally, Gleason 3+3 was seen in right mid (small focus) and right apex.  Of note, he was recently found to have an obstructing ureteral stone and required ESWL on 09/13/23 under Dr. Mena Goes.  The patient reviewed the biopsy results with his urologist and he has kindly been referred today for discussion of potential radiation treatment options.   PREVIOUS RADIATION THERAPY: No  PAST MEDICAL HISTORY:  Past Medical History:  Diagnosis Date   Chronic back pain    hx fall 2006 from 3 stories , vertebral fx   Complication of anesthesia    ED (erectile dysfunction)    Elevated PSA     Exercise-induced asthma    History of concussion    2006 from fall   History of kidney stones    Hypertension    Hypothyroidism    Nephrolithiasis    bilateral   PONV (postoperative nausea and vomiting)    Right ureteral stone       PAST SURGICAL HISTORY: Past Surgical History:  Procedure Laterality Date   CARDIAC CATHETERIZATION  07-12-2011   dr Sharyn Lull   abnormal myoview/  normal coronary arteries and normal LVSF   CARDIOVASCULAR STRESS TEST  06-15-2011   dr Sharyn Lull   mild reversible periinfarct anterior wall ischemia, normal wall motion , ef 49%   COLONOSCOPY WITH PROPOFOL N/A 12/03/2016   Procedure: COLONOSCOPY WITH PROPOFOL;  Surgeon: Charolett Bumpers, MD;  Location: [facility];  Service: Endoscopy;  Laterality: N/A;   CYSTOSCOPY WITH HOLMIUM LASER LITHOTRIPSY Left 02/15/2015   Procedure: CYSTOSCOPY WITH HOLMIUM LASER LITHOTRIPSY;  Surgeon: Jethro Bolus, MD;  Location: [facility];  Service: Urology;  Laterality: Left;   CYSTOSCOPY WITH STENT PLACEMENT Left 02/15/2015   Procedure: CYSTOSCOPY WITH STENT PLACEMENT LEFT URETER;  Surgeon: Jethro Bolus, MD;  Location: [facility];  Service: Urology;  Laterality: Left;   CYSTOSCOPY/RETROGRADE/URETEROSCOPY/STONE EXTRACTION WITH BASKET Left 02/15/2015   Procedure: CYSTOSCOPY/RETROGRADE/URETEROSCOPY/STONE EXTRACTION WITH BASKET;  Surgeon: Jethro Bolus, MD;  Location: [facility];  Service: Urology;  Laterality: Left;   EXTRACORPOREAL SHOCK WAVE LITHOTRIPSY Right 02/03/2018   Procedure: RIGHT EXTRACORPOREAL SHOCK WAVE LITHOTRIPSY (ESWL);  Surgeon: Rene Paci, MD;  Location: [facility];  Service: Urology;  Laterality: Right;   EXTRACORPOREAL SHOCK WAVE LITHOTRIPSY Left 09/13/2023   Procedure: LEFT EXTRACORPOREAL SHOCK WAVE LITHOTRIPSY (ESWL);  Surgeon: Jerilee Field, MD;  Location: [facility];  Service: Urology;  Laterality: Left;   PROSTATE BIOPSY     VARICOCELECTOMY  2001 ?    FAMILY HISTORY:   Family History  Problem Relation Age of Onset   Hypertension Mother    Cancer Maternal Aunt        Breast    SOCIAL HISTORY:  Social History   Socioeconomic History   Marital status: Married    Spouse name: Not on file   Number of children: Not on file   Years of education: Not on file   Highest education level: Not on file  Occupational History   Not on file  Tobacco Use   Smoking status: Never   Smokeless tobacco: Never  Vaping Use   Vaping status: Never Used  Substance and Sexual Activity   Alcohol use: No    Alcohol/week: 0.0 standard drinks of alcohol   Drug use: No   Sexual activity: Not Currently  Other Topics Concern   Not on file  Social History Narrative   Not on file   Social Drivers of Health   Financial Resource Strain: Not on file  Food Insecurity: Not on file  Transportation Needs: Not on file  Physical Activity: Not on file  Stress: Not on file  Social Connections: Not on file  Intimate Partner Violence: Not on file    ALLERGIES: Molds & smuts and Dust mite extract  MEDICATIONS:  Current Outpatient Medications  Medication Sig Dispense Refill   albuterol (PROVENTIL) (2.5 MG/3ML) 0.083% nebulizer solution Take 3 mLs by nebulization every 6 hours as needed for wheezing or shortness of breath. 90 mL 1   albuterol (VENTOLIN HFA) 108 (90 Base) MCG/ACT inhaler Inhale 2 puffs into the lungs every 4 hours as needed for cough/wheeze 6.7 g 2   amLODipine-valsartan (EXFORGE) 10-320 MG tablet Take 1 tablet by mouth once a day 30 tablet 11   anastrozole (ARIMIDEX) 1 MG tablet Take 1 tablet by mouth every day. 90 tablet 3   bisacodyl (DULCOLAX) 10 MG suppository Place 1 suppository (10 mg total) rectally as needed for moderate constipation. 12 suppository 0   clomiPHENE (CLOMID) 50 MG tablet Take 1 tablet (50 mg total) by mouth daily. 90 tablet 3   doxazosin (CARDURA) 8 MG tablet Take 1 tablet (8 mg total) by mouth daily. 90 tablet 3   doxycycline  (VIBRA-TABS) 100 MG tablet Take 1 tablet (100 mg total) by mouth 2 (two) times daily. (Patient not taking: Reported on 12/25/2022) 28 tablet 0   EPINEPHrine 0.3 mg/0.3 mL IJ SOAJ injection Inject as directed as needed for systemic reaction 2 each 1   Fluticasone-Umeclidin-Vilant (TRELEGY ELLIPTA) 100-62.5-25 MCG/ACT AEPB Inhale 1 puff into the lungs daily. 60 each 3   folic acid (FOLVITE) 1 MG tablet Take 1 tablet (1 mg total) by mouth daily. 90 tablet 3   levocetirizine (XYZAL) 5 MG tablet Take 1 tablet (5 mg total) by mouth daily. 30 tablet 3   levothyroxine (SYNTHROID) 50 MCG tablet Take 1 tablet (50 mcg total) by mouth every morning. 90 tablet 4   linaclotide (LINZESS) 145 MCG CAPS capsule Take 1 capsule (145 mcg total) by mouth daily after breakfast. 30 capsule 0   meloxicam (MOBIC) 15 MG tablet Take 1 tablet (15 mg total) by mouth daily  as needed.     metoCLOPramide (REGLAN) 10 MG tablet Take 1 tablet (10 mg total) by mouth every 6 (six) hours as needed for nausea or vomiting. 10 tablet 0   montelukast (SINGULAIR) 10 MG tablet Take 1 tablet (10 mg total) by mouth daily. 90 tablet 1   oxyCODONE-acetaminophen (PERCOCET/ROXICET) 5-325 MG tablet Take 1 tablet by mouth 3 (three) times daily as needed. 90 tablet 0   oxyCODONE-acetaminophen (PERCOCET/ROXICET) 5-325 MG tablet Take 1 tablet by mouth 3 (three) times daily. 90 tablet 0   sildenafil (VIAGRA) 100 MG tablet Take 1 tablet (100 mg total) by mouth one hour prior to intercourse as needed 20 tablet 5   TRELEGY ELLIPTA 100-62.5-25 MCG/INH AEPB Inhale 1 puff into the lungs daily.     No current facility-administered medications for this encounter.     REVIEW OF SYSTEMS:  On review of systems, the patient reports that he is doing well overall. He denies any chest pain, shortness of breath, cough, fevers, chills, night sweats, unintended weight changes. He denies any bowel disturbances, and denies abdominal pain, nausea or vomiting. He denies any  new musculoskeletal or joint aches or pains. His IPSS was Total Score: 17, indicating moderate urinary symptoms (Reference 0-7 mild, 8-19 moderate, 20-35 severe).  His SHIM: 9, indicating he has moderate erectile dysfunction (Reference - 22-25 None, 17-21 Mild, 8-16 Moderate, 1-7 Severe). A complete review of systems is obtained and is otherwise negative.     PHYSICAL EXAM:  Wt Readings from Last 3 Encounters:  09/23/23 242 lb 12.8 oz (110.1 kg)  09/13/23 251 lb 4.8 oz (114 kg)  04/11/20 245 lb 12.8 oz (111.5 kg)   Temp Readings from Last 3 Encounters:  09/13/23 98.4 F (36.9 C)  09/10/23 99.1 F (37.3 C) (Oral)  12/25/22 98.1 F (36.7 C) (Oral)   BP Readings from Last 3 Encounters:  09/23/23 (!) 161/90  09/13/23 (!) 158/97  09/10/23 129/83   Pulse Readings from Last 3 Encounters:  09/23/23 89  09/13/23 66  09/10/23 62   Pain Assessment Pain Score: 7  Pain Loc: Back (left leg and groin)/10  In general this is a well appearing male in no acute distress. He's alert and oriented x4 and appropriate throughout the examination. Cardiopulmonary assessment is negative for acute distress, and he exhibits normal effort.     KPS = 100  100 - Normal; no complaints; no evidence of disease. 90   - Able to carry on normal activity; minor signs or symptoms of disease. 80   - Normal activity with effort; some signs or symptoms of disease. 35   - Cares for self; unable to carry on normal activity or to do active work. 60   - Requires occasional assistance, but is able to care for most of his personal needs. 50   - Requires considerable assistance and frequent medical care. 40   - Disabled; requires special care and assistance. 30   - Severely disabled; hospital admission is indicated although death not imminent. 20   - Very sick; hospital admission necessary; active supportive treatment necessary. 10   - Moribund; fatal processes progressing rapidly. 0     - Dead  Karnofsky DA,  Abelmann WH, Craver LS and Burchenal [facility] [phone]) The use of the nitrogen mustards in the palliative treatment of carcinoma: with particular reference to bronchogenic carcinoma Cancer 1 634-56  LABORATORY DATA:  Lab Results  Component Value Date   WBC 9.7 09/10/2023   HGB 11.9 (L)  09/10/2023   HCT 36.8 (L) 09/10/2023   MCV 82.9 09/10/2023   PLT 197 09/10/2023   Lab Results  Component Value Date   NA 138 09/10/2023   K 4.2 09/10/2023   CL 102 09/10/2023   CO2 28 09/10/2023   Lab Results  Component Value Date   ALT 34 09/10/2023   AST 35 09/10/2023   ALKPHOS 53 09/10/2023   BILITOT 0.6 09/10/2023     RADIOGRAPHY: CT ABDOMEN PELVIS W CONTRAST Result Date: 09/10/2023 CLINICAL DATA:  Abdominal pain, acute, nonlocalized ?SBO. EXAM: CT ABDOMEN AND PELVIS WITH CONTRAST TECHNIQUE: Multidetector CT imaging of the abdomen and pelvis was performed using the standard protocol following bolus administration of intravenous contrast. RADIATION DOSE REDUCTION: This exam was performed according to the departmental dose-optimization program which includes automated exposure control, adjustment of the mA and/or kV according to patient size and/or use of iterative reconstruction technique. CONTRAST:  75mL OMNIPAQUE IOHEXOL 300 MG/ML  SOLN COMPARISON:  CT scan renal stone protocol from 03/05/2015. FINDINGS: Lower chest: The lung bases are clear. There is trace right pleural effusion. No left pleural effusion. The heart is normal in size. No pericardial effusion. Hepatobiliary: The liver is normal in size. Non-cirrhotic configuration. No suspicious mass. These is marked diffuse hepatic steatosis. No intrahepatic or extrahepatic bile duct dilation. No calcified gallstones. Normal gallbladder wall thickness. No pericholecystic inflammatory changes. Pancreas: Unremarkable. No pancreatic ductal dilatation or surrounding inflammatory changes. Spleen: Within normal limits. No focal lesion. Adrenals/Urinary Tract: Adrenal  glands are unremarkable. There is a 5.5 by 8 mm left upper ureteric calculus causing moderate proximal hydronephrosis. There is also relative hypoattenuation of left kidney in comparison to the right, suggesting acute obstructive uropathy. There are additional approximately 10, sub 4 mm calculi in the left kidney. There are several simple cysts in the left kidney with largest in the interpolar region, laterally measuring up to 1.5 x 1.8 cm. Left ureter distal to the calculus is nondilated. There are approximately 10 nonobstructive calculi in the right kidney with largest measuring up to 7 mm. No right ureterolithiasis or obstructive uropathy. There are multiple subcentimeter hypoattenuating foci in the right kidney, which are too small to adequately characterize. Urinary bladder is under distended, precluding optimal assessment. However, no large mass or stones identified. No perivesical fat stranding. Stomach/Bowel: No disproportionate dilation of the small or large bowel loops. No evidence of abnormal bowel wall thickening or inflammatory changes. The appendix is unremarkable. There is protrusion of the tip of the appendix into the right inguinal hernia. There is liquid unformed stool throughout the colon, nonspecific but can be seen with diarrhea. Vascular/Lymphatic: No ascites or pneumoperitoneum. No abdominal or pelvic lymphadenopathy, by size criteria. No aneurysmal dilation of the major abdominal arteries. Reproductive: Enlarged prostate. Symmetric seminal vesicles. Other: There is small fat containing umbilical hernia and a tiny fat containing left inguinal hernia. There is also a small right inguinal hernia containing ascitic fluid and portion of appendix. The soft tissues and abdominal wall are otherwise unremarkable. Musculoskeletal: No suspicious osseous lesions. There are mild multilevel degenerative changes in the visualized spine. IMPRESSION: 1. No bowel obstruction. No acute inflammatory process  identified within the abdomen or pelvis. 2. There is a 5.5 x 8 mm left upper ureteric calculus causing moderate to marked proximal obstructive uropathy. 3. There are multiple bilateral additional renal calculi. No right obstructive uropathy. 4. Multiple other nonacute observations, as described above. Electronically Signed   By: Jules Schick M.D.   On: 09/10/2023  16:25      IMPRESSION/PLAN: 1. 58 y.o. gentleman with Stage T1c adenocarcinoma of the prostate with Gleason Score of 3+4, and PSA of 3.08. We discussed the patient's workup and outlined the nature of prostate cancer in this setting. The patient's T stage, Gleason's score, and PSA put him into the favorable intermediate risk group. Accordingly, he is eligible for a variety of potential treatment options including brachytherapy, 5.5 weeks of external radiation, or prostatectomy. We discussed the available radiation techniques, and focused on the details and logistics of delivery. The patient may not be an ideal candidate for brachytherapy/boost with a prostate volume of 82 cc. We discussed and outlined the risks, benefits, short and long-term effects associated with radiotherapy and compared and contrasted these with prostatectomy. We discussed the role of SpaceOAR gel in reducing the rectal toxicity associated with radiotherapy.  He appears to have a good understanding of his disease and our treatment recommendations which are of curative intent.  He was encouraged to ask questions that were answered to his stated satisfaction.  At the conclusion of our conversation, the patient is interested in moving forward with consultation with Dr. Laverle Patter.  He is leaning toward prostatectomy.  We discussed this preference and also discussed the indications for salvage radiotherapy in the future.  We personally spent 30 minutes in this encounter including chart review, reviewing radiological studies, meeting face-to-face with the patient, entering orders and  completing documentation.      Margaretmary Dys, MD  [facility]  Radiation Oncology Direct Dial: [phone]  Fax: [phone] [email or web address]  Skype  [email or web address]   This document serves as a record of services personally performed by Margaretmary Dys, MD and Marcello Fennel, PA-C. It was created on their behalf by Mickie Bail, a trained medical scribe. The creation of this record is based on the scribe's personal observations and the provider's statements to them. This document has been checked and approved by the attending provider.

## 2023-09-23 ENCOUNTER — Encounter: Payer: Self-pay | Admitting: Radiation Oncology

## 2023-09-23 ENCOUNTER — Ambulatory Visit
Admission: RE | Admit: 2023-09-23 | Discharge: 2023-09-23 | Disposition: A | Discharge status: Home / Self Care | Payer: Commercial Managed Care - PPO | Source: Ambulatory Visit | Attending: Radiation Oncology | Admitting: Radiation Oncology

## 2023-09-23 VITALS — BP 161/90 | HR 89 | Resp 20 | Ht 66.5 in | Wt 242.8 lb

## 2023-09-23 DIAGNOSIS — Z803 Family history of malignant neoplasm of breast: Secondary | ICD-10-CM | POA: Diagnosis not present

## 2023-09-23 DIAGNOSIS — J45990 Exercise induced bronchospasm: Secondary | ICD-10-CM | POA: Diagnosis not present

## 2023-09-23 DIAGNOSIS — K760 Fatty (change of) liver, not elsewhere classified: Secondary | ICD-10-CM | POA: Insufficient documentation

## 2023-09-23 DIAGNOSIS — I10 Essential (primary) hypertension: Secondary | ICD-10-CM | POA: Insufficient documentation

## 2023-09-23 DIAGNOSIS — G8929 Other chronic pain: Secondary | ICD-10-CM | POA: Insufficient documentation

## 2023-09-23 DIAGNOSIS — C61 Malignant neoplasm of prostate: Secondary | ICD-10-CM | POA: Insufficient documentation

## 2023-09-23 DIAGNOSIS — Z79811 Long term (current) use of aromatase inhibitors: Secondary | ICD-10-CM | POA: Diagnosis not present

## 2023-09-23 DIAGNOSIS — Z79899 Other long term (current) drug therapy: Secondary | ICD-10-CM | POA: Diagnosis not present

## 2023-09-23 DIAGNOSIS — J90 Pleural effusion, not elsewhere classified: Secondary | ICD-10-CM | POA: Insufficient documentation

## 2023-09-23 DIAGNOSIS — N132 Hydronephrosis with renal and ureteral calculous obstruction: Secondary | ICD-10-CM | POA: Diagnosis not present

## 2023-09-23 DIAGNOSIS — E039 Hypothyroidism, unspecified: Secondary | ICD-10-CM | POA: Diagnosis not present

## 2023-09-23 DIAGNOSIS — Z191 Hormone sensitive malignancy status: Secondary | ICD-10-CM | POA: Diagnosis not present

## 2023-09-23 HISTORY — DX: Elevated prostate specific antigen (PSA): R97.20

## 2023-09-23 NOTE — Progress Notes (Signed)
 Introduced myself to the patient as the prostate nurse navigator.  No barriers to care identified at this time.  He is here to discuss his radiation treatment options and will have a surgical consult on 3/7.  I gave him my business card and asked him to call me with questions or concerns.  Verbalized understanding.

## 2023-10-04 DIAGNOSIS — J3081 Allergic rhinitis due to animal (cat) (dog) hair and dander: Secondary | ICD-10-CM | POA: Diagnosis not present

## 2023-10-04 DIAGNOSIS — J301 Allergic rhinitis due to pollen: Secondary | ICD-10-CM | POA: Diagnosis not present

## 2023-10-04 DIAGNOSIS — J3089 Other allergic rhinitis: Secondary | ICD-10-CM | POA: Diagnosis not present

## 2023-10-04 DIAGNOSIS — C61 Malignant neoplasm of prostate: Secondary | ICD-10-CM | POA: Diagnosis not present

## 2023-10-08 ENCOUNTER — Other Ambulatory Visit (HOSPITAL_COMMUNITY): Payer: Self-pay

## 2023-10-08 NOTE — Progress Notes (Signed)
 RN spoke with patient and reviewed recommendations from MD regarding treatment for his stage T1c adenocarcinoma of the prostate with Gleason score of 3+4, and PSA of 3.08.   All questions answered.   Patient has confirmed he would like to proceed with 5.5 weeks of daily radiation.  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   MD's notified, plan of care in progress.

## 2023-10-08 NOTE — Progress Notes (Signed)
 Patient had surgical consult on 3/7 and upon review on notes patient is unsure if he would like to proceed with any curative therapy.    RN placed call to patient for follow up.  Voicemail left for return call.

## 2023-10-09 DIAGNOSIS — M25552 Pain in left hip: Secondary | ICD-10-CM | POA: Diagnosis not present

## 2023-10-09 DIAGNOSIS — M545 Low back pain, unspecified: Secondary | ICD-10-CM | POA: Diagnosis not present

## 2023-10-21 DIAGNOSIS — J3089 Other allergic rhinitis: Secondary | ICD-10-CM | POA: Diagnosis not present

## 2023-10-21 DIAGNOSIS — J301 Allergic rhinitis due to pollen: Secondary | ICD-10-CM | POA: Diagnosis not present

## 2023-10-21 DIAGNOSIS — J3081 Allergic rhinitis due to animal (cat) (dog) hair and dander: Secondary | ICD-10-CM | POA: Diagnosis not present

## 2023-10-27 ENCOUNTER — Other Ambulatory Visit (HOSPITAL_COMMUNITY): Payer: Self-pay

## 2023-10-28 ENCOUNTER — Other Ambulatory Visit (HOSPITAL_COMMUNITY): Payer: Self-pay

## 2023-10-28 MED ORDER — FOLIC ACID 1 MG PO TABS
1.0000 mg | ORAL_TABLET | Freq: Every day | ORAL | 11 refills | Status: AC
  Filled 2023-10-28: qty 90, 90d supply, fill #0
  Filled 2024-02-28: qty 90, 90d supply, fill #1
  Filled 2024-07-26: qty 90, 90d supply, fill #2

## 2023-10-29 ENCOUNTER — Other Ambulatory Visit (HOSPITAL_COMMUNITY): Payer: Self-pay

## 2023-10-29 DIAGNOSIS — C61 Malignant neoplasm of prostate: Secondary | ICD-10-CM | POA: Diagnosis not present

## 2023-10-29 DIAGNOSIS — N529 Male erectile dysfunction, unspecified: Secondary | ICD-10-CM | POA: Diagnosis not present

## 2023-10-29 DIAGNOSIS — M79605 Pain in left leg: Secondary | ICD-10-CM | POA: Diagnosis not present

## 2023-10-29 MED ORDER — LINZESS 145 MCG PO CAPS
145.0000 ug | ORAL_CAPSULE | Freq: Every day | ORAL | 1 refills | Status: AC
Start: 2023-10-29 — End: ?
  Filled 2023-10-29: qty 30, 30d supply, fill #0

## 2023-10-29 MED ORDER — PREDNISONE 10 MG PO TABS
20.0000 mg | ORAL_TABLET | Freq: Every day | ORAL | 0 refills | Status: DC
  Filled 2023-10-29: qty 10, 5d supply, fill #0

## 2023-10-29 MED ORDER — SILDENAFIL CITRATE 100 MG PO TABS
100.0000 mg | ORAL_TABLET | Freq: Every day | ORAL | 2 refills | Status: AC
  Filled 2023-10-29: qty 5, 30d supply, fill #0

## 2023-10-29 MED ORDER — PREDNISONE 10 MG (21) PO TBPK
ORAL_TABLET | ORAL | 0 refills | Status: DC
Start: 2023-10-29 — End: 2023-10-29
  Filled 2023-10-29: qty 21, 6d supply, fill #0

## 2023-10-30 ENCOUNTER — Other Ambulatory Visit (HOSPITAL_COMMUNITY): Payer: Self-pay

## 2023-10-30 DIAGNOSIS — M79605 Pain in left leg: Secondary | ICD-10-CM | POA: Diagnosis not present

## 2023-11-01 DIAGNOSIS — G4733 Obstructive sleep apnea (adult) (pediatric): Secondary | ICD-10-CM | POA: Diagnosis not present

## 2023-11-01 DIAGNOSIS — M25552 Pain in left hip: Secondary | ICD-10-CM | POA: Diagnosis not present

## 2023-11-04 ENCOUNTER — Other Ambulatory Visit (HOSPITAL_COMMUNITY): Payer: Self-pay

## 2023-11-11 ENCOUNTER — Other Ambulatory Visit: Payer: Self-pay | Admitting: Urology

## 2023-11-11 DIAGNOSIS — J3089 Other allergic rhinitis: Secondary | ICD-10-CM | POA: Diagnosis not present

## 2023-11-11 DIAGNOSIS — J301 Allergic rhinitis due to pollen: Secondary | ICD-10-CM | POA: Diagnosis not present

## 2023-11-21 DIAGNOSIS — J3089 Other allergic rhinitis: Secondary | ICD-10-CM | POA: Diagnosis not present

## 2023-11-21 DIAGNOSIS — J301 Allergic rhinitis due to pollen: Secondary | ICD-10-CM | POA: Diagnosis not present

## 2023-11-25 DIAGNOSIS — J3081 Allergic rhinitis due to animal (cat) (dog) hair and dander: Secondary | ICD-10-CM | POA: Diagnosis not present

## 2023-11-25 DIAGNOSIS — J3089 Other allergic rhinitis: Secondary | ICD-10-CM | POA: Diagnosis not present

## 2023-11-25 DIAGNOSIS — J301 Allergic rhinitis due to pollen: Secondary | ICD-10-CM | POA: Diagnosis not present

## 2023-11-26 ENCOUNTER — Telehealth: Payer: Self-pay | Admitting: *Deleted

## 2023-11-26 NOTE — Telephone Encounter (Signed)
 Returned patient's phone call, spoke with patient

## 2023-11-27 ENCOUNTER — Other Ambulatory Visit: Payer: Self-pay | Admitting: Urology

## 2023-11-27 DIAGNOSIS — C61 Malignant neoplasm of prostate: Secondary | ICD-10-CM

## 2023-11-28 NOTE — Progress Notes (Signed)
 RN spoke with patient and reviewed next steps with fiducial marker's, spaceOAR, and CT Simulation.  All questions answered. No additional needs at this time.

## 2023-12-01 DIAGNOSIS — G4733 Obstructive sleep apnea (adult) (pediatric): Secondary | ICD-10-CM | POA: Diagnosis not present

## 2023-12-02 DIAGNOSIS — G4733 Obstructive sleep apnea (adult) (pediatric): Secondary | ICD-10-CM | POA: Diagnosis not present

## 2023-12-02 DIAGNOSIS — C61 Malignant neoplasm of prostate: Secondary | ICD-10-CM | POA: Diagnosis not present

## 2023-12-09 DIAGNOSIS — E291 Testicular hypofunction: Secondary | ICD-10-CM | POA: Diagnosis not present

## 2023-12-09 DIAGNOSIS — I10 Essential (primary) hypertension: Secondary | ICD-10-CM | POA: Diagnosis not present

## 2023-12-09 DIAGNOSIS — J3089 Other allergic rhinitis: Secondary | ICD-10-CM | POA: Diagnosis not present

## 2023-12-09 DIAGNOSIS — G4733 Obstructive sleep apnea (adult) (pediatric): Secondary | ICD-10-CM | POA: Diagnosis not present

## 2023-12-09 DIAGNOSIS — J301 Allergic rhinitis due to pollen: Secondary | ICD-10-CM | POA: Diagnosis not present

## 2023-12-09 DIAGNOSIS — C61 Malignant neoplasm of prostate: Secondary | ICD-10-CM | POA: Diagnosis not present

## 2023-12-09 DIAGNOSIS — J3081 Allergic rhinitis due to animal (cat) (dog) hair and dander: Secondary | ICD-10-CM | POA: Diagnosis not present

## 2023-12-09 DIAGNOSIS — E039 Hypothyroidism, unspecified: Secondary | ICD-10-CM | POA: Diagnosis not present

## 2023-12-09 DIAGNOSIS — G894 Chronic pain syndrome: Secondary | ICD-10-CM | POA: Diagnosis not present

## 2023-12-09 DIAGNOSIS — J449 Chronic obstructive pulmonary disease, unspecified: Secondary | ICD-10-CM | POA: Diagnosis not present

## 2023-12-09 DIAGNOSIS — F331 Major depressive disorder, recurrent, moderate: Secondary | ICD-10-CM | POA: Diagnosis not present

## 2023-12-09 DIAGNOSIS — N200 Calculus of kidney: Secondary | ICD-10-CM | POA: Diagnosis not present

## 2023-12-12 ENCOUNTER — Other Ambulatory Visit (HOSPITAL_COMMUNITY): Payer: Self-pay

## 2023-12-12 MED ORDER — AZITHROMYCIN 250 MG PO TABS
ORAL_TABLET | ORAL | 0 refills | Status: AC
Start: 2023-12-12 — End: ?
  Filled 2023-12-12: qty 6, 5d supply, fill #0

## 2023-12-12 MED ORDER — OXYCODONE-ACETAMINOPHEN 5-325 MG PO TABS
1.0000 | ORAL_TABLET | Freq: Three times a day (TID) | ORAL | 0 refills | Status: AC
  Filled 2023-12-12: qty 90, 30d supply, fill #0

## 2023-12-13 ENCOUNTER — Other Ambulatory Visit: Payer: Self-pay

## 2023-12-13 ENCOUNTER — Other Ambulatory Visit (HOSPITAL_COMMUNITY): Payer: Self-pay

## 2023-12-13 DIAGNOSIS — N5201 Erectile dysfunction due to arterial insufficiency: Secondary | ICD-10-CM | POA: Diagnosis not present

## 2023-12-16 ENCOUNTER — Other Ambulatory Visit (HOSPITAL_COMMUNITY): Payer: Self-pay

## 2023-12-19 ENCOUNTER — Other Ambulatory Visit (HOSPITAL_COMMUNITY): Payer: Self-pay

## 2023-12-19 DIAGNOSIS — J301 Allergic rhinitis due to pollen: Secondary | ICD-10-CM | POA: Diagnosis not present

## 2023-12-19 DIAGNOSIS — J3089 Other allergic rhinitis: Secondary | ICD-10-CM | POA: Diagnosis not present

## 2023-12-19 DIAGNOSIS — J4540 Moderate persistent asthma, uncomplicated: Secondary | ICD-10-CM | POA: Diagnosis not present

## 2023-12-19 DIAGNOSIS — K219 Gastro-esophageal reflux disease without esophagitis: Secondary | ICD-10-CM | POA: Diagnosis not present

## 2023-12-19 MED ORDER — PREDNISONE 10 MG PO TABS
ORAL_TABLET | ORAL | 0 refills | Status: AC
  Filled 2023-12-19: qty 10, 4d supply, fill #0

## 2023-12-19 MED ORDER — OMEPRAZOLE 40 MG PO CPDR
40.0000 mg | DELAYED_RELEASE_CAPSULE | Freq: Every day | ORAL | 3 refills | Status: AC
  Filled 2023-12-19: qty 90, 90d supply, fill #0

## 2023-12-19 MED ORDER — AZELASTINE HCL 137 MCG/SPRAY NA SOLN
2.0000 | Freq: Two times a day (BID) | NASAL | 3 refills | Status: AC
Start: 2023-12-19 — End: ?
  Filled 2023-12-19: qty 90, 90d supply, fill #0

## 2023-12-19 MED ORDER — TRELEGY ELLIPTA 200-62.5-25 MCG/ACT IN AEPB
1.0000 | INHALATION_SPRAY | Freq: Every day | RESPIRATORY_TRACT | 3 refills | Status: AC
  Filled 2023-12-19: qty 60, 30d supply, fill #0
  Filled 2024-06-24: qty 180, 90d supply, fill #1
  Filled 2024-06-24 – 2024-06-26 (×2): qty 60, 30d supply, fill #1
  Filled 2024-07-03: qty 180, 90d supply, fill #1
  Filled 2024-07-03: qty 60, 30d supply, fill #1

## 2023-12-25 ENCOUNTER — Other Ambulatory Visit: Payer: Self-pay | Admitting: Urology

## 2023-12-30 DIAGNOSIS — G4733 Obstructive sleep apnea (adult) (pediatric): Secondary | ICD-10-CM | POA: Diagnosis not present

## 2024-01-01 ENCOUNTER — Other Ambulatory Visit (HOSPITAL_COMMUNITY): Payer: Self-pay

## 2024-01-01 DIAGNOSIS — J3081 Allergic rhinitis due to animal (cat) (dog) hair and dander: Secondary | ICD-10-CM | POA: Diagnosis not present

## 2024-01-01 DIAGNOSIS — G4733 Obstructive sleep apnea (adult) (pediatric): Secondary | ICD-10-CM | POA: Diagnosis not present

## 2024-01-01 DIAGNOSIS — J301 Allergic rhinitis due to pollen: Secondary | ICD-10-CM | POA: Diagnosis not present

## 2024-01-01 DIAGNOSIS — J3089 Other allergic rhinitis: Secondary | ICD-10-CM | POA: Diagnosis not present

## 2024-01-01 MED ORDER — EPINEPHRINE 0.3 MG/0.3ML IJ SOAJ
0.3000 mg | INTRAMUSCULAR | 1 refills | Status: AC | PRN
  Filled 2024-01-01: qty 2, 30d supply, fill #0

## 2024-01-06 DIAGNOSIS — J301 Allergic rhinitis due to pollen: Secondary | ICD-10-CM | POA: Diagnosis not present

## 2024-01-06 DIAGNOSIS — J3081 Allergic rhinitis due to animal (cat) (dog) hair and dander: Secondary | ICD-10-CM | POA: Diagnosis not present

## 2024-01-06 DIAGNOSIS — J3089 Other allergic rhinitis: Secondary | ICD-10-CM | POA: Diagnosis not present

## 2024-01-13 ENCOUNTER — Other Ambulatory Visit (HOSPITAL_COMMUNITY): Payer: Self-pay

## 2024-01-15 DIAGNOSIS — J301 Allergic rhinitis due to pollen: Secondary | ICD-10-CM | POA: Diagnosis not present

## 2024-01-15 DIAGNOSIS — J3081 Allergic rhinitis due to animal (cat) (dog) hair and dander: Secondary | ICD-10-CM | POA: Diagnosis not present

## 2024-01-15 DIAGNOSIS — J3089 Other allergic rhinitis: Secondary | ICD-10-CM | POA: Diagnosis not present

## 2024-01-20 ENCOUNTER — Encounter (HOSPITAL_COMMUNITY): Payer: Self-pay | Admitting: Urology

## 2024-01-20 ENCOUNTER — Other Ambulatory Visit (HOSPITAL_COMMUNITY): Payer: Self-pay

## 2024-01-20 DIAGNOSIS — J301 Allergic rhinitis due to pollen: Secondary | ICD-10-CM | POA: Diagnosis not present

## 2024-01-20 DIAGNOSIS — J3089 Other allergic rhinitis: Secondary | ICD-10-CM | POA: Diagnosis not present

## 2024-01-20 DIAGNOSIS — J3081 Allergic rhinitis due to animal (cat) (dog) hair and dander: Secondary | ICD-10-CM | POA: Diagnosis not present

## 2024-01-20 MED ORDER — OXYCODONE-ACETAMINOPHEN 5-325 MG PO TABS
1.0000 | ORAL_TABLET | Freq: Three times a day (TID) | ORAL | 0 refills | Status: AC | PRN
  Filled 2024-01-20: qty 90, 30d supply, fill #0

## 2024-01-20 NOTE — Progress Notes (Signed)
 Spoke w/ via phone for pre-op interview--- [employer]----  BMP and EKG per anesthesia        Lab results------ COVID test -----patient states asymptomatic no test needed Arrive at -------0930 NPO after MN NO Solid Food.  Clear liquids from MN until---0830 Pre-Surgery Ensure or G2:  Med rec completed Medications to take morning of surgery -----Albuterol  inhlaer-bring. Cardura , Levothyroxine , Prilosec and Oxycodone . Diabetic medication -----  GLP1 agonist last dose: GLP1 instructions:  Patient instructed no nail polish to be worn day of surgery Patient instructed to bring photo id and insurance card day of surgery Patient aware to have Driver (ride ) / caregiver    for 24 hours after surgery - Nicholas Mcintosh- mother Patient Special Instructions ----- Fleet enema per surgeon instructions. Shower with antibacterial soap. Pre-Op special Instructions -----NS pt has Rocephin ordered.  Patient verbalized understanding of instructions that were given at this phone interview. Patient denies chest pain, sob, fever, cough at the interview.

## 2024-01-24 ENCOUNTER — Ambulatory Visit (HOSPITAL_COMMUNITY): Admitting: Anesthesiology

## 2024-01-24 ENCOUNTER — Other Ambulatory Visit: Payer: Self-pay

## 2024-01-24 ENCOUNTER — Encounter (HOSPITAL_COMMUNITY): Payer: Self-pay | Admitting: Urology

## 2024-01-24 ENCOUNTER — Ambulatory Visit (HOSPITAL_BASED_OUTPATIENT_CLINIC_OR_DEPARTMENT_OTHER): Admitting: Anesthesiology

## 2024-01-24 ENCOUNTER — Encounter (HOSPITAL_COMMUNITY)
Admission: RE | Disposition: A | Discharge status: Home / Self Care | Payer: Self-pay | Source: Home / Self Care | Attending: Urology

## 2024-01-24 ENCOUNTER — Ambulatory Visit (HOSPITAL_COMMUNITY)
Admission: RE | Admit: 2024-01-24 | Discharge: 2024-01-24 | Disposition: A | Discharge status: Home / Self Care | Attending: Urology | Admitting: Urology

## 2024-01-24 DIAGNOSIS — I498 Other specified cardiac arrhythmias: Secondary | ICD-10-CM | POA: Diagnosis not present

## 2024-01-24 DIAGNOSIS — E039 Hypothyroidism, unspecified: Secondary | ICD-10-CM | POA: Diagnosis not present

## 2024-01-24 DIAGNOSIS — G4730 Sleep apnea, unspecified: Secondary | ICD-10-CM | POA: Diagnosis not present

## 2024-01-24 DIAGNOSIS — Z7982 Long term (current) use of aspirin: Secondary | ICD-10-CM | POA: Insufficient documentation

## 2024-01-24 DIAGNOSIS — C61 Malignant neoplasm of prostate: Secondary | ICD-10-CM

## 2024-01-24 DIAGNOSIS — Z79899 Other long term (current) drug therapy: Secondary | ICD-10-CM | POA: Diagnosis not present

## 2024-01-24 DIAGNOSIS — I10 Essential (primary) hypertension: Secondary | ICD-10-CM | POA: Insufficient documentation

## 2024-01-24 DIAGNOSIS — Z79890 Hormone replacement therapy (postmenopausal): Secondary | ICD-10-CM | POA: Insufficient documentation

## 2024-01-24 HISTORY — PX: SPACE OAR INSTILLATION: SHX6769

## 2024-01-24 HISTORY — DX: Gastro-esophageal reflux disease without esophagitis: K21.9

## 2024-01-24 HISTORY — PX: GOLD SEED IMPLANT: SHX6343

## 2024-01-24 HISTORY — DX: Sleep apnea, unspecified: G47.30

## 2024-01-24 LAB — BASIC METABOLIC PANEL WITH GFR
Anion gap: 11 (ref 5–15)
BUN: 13 mg/dL (ref 6–20)
CO2: 16 mmol/L — ABNORMAL LOW (ref 22–32)
Calcium: 8.9 mg/dL (ref 8.9–10.3)
Chloride: 108 mmol/L (ref 98–111)
Creatinine, Ser: 1 mg/dL (ref 0.61–1.24)
GFR, Estimated: >60 mL/min (ref >60)
Glucose, Bld: 112 mg/dL — ABNORMAL HIGH (ref 70–99)
Potassium: 4.3 mmol/L (ref 3.5–5.1)
Sodium: 135 mmol/L (ref 135–145)

## 2024-01-24 SURGERY — INSERTION, GOLD SEEDS
Anesthesia: Monitor Anesthesia Care | Site: Prostate

## 2024-01-24 MED ORDER — LIDOCAINE 2% (20 MG/ML) 5 ML SYRINGE
INTRAMUSCULAR | Status: AC
  Filled 2024-01-24: qty 5

## 2024-01-24 MED ORDER — SODIUM CHLORIDE (PF) 0.9 % IJ SOLN
INTRAMUSCULAR | Status: AC
  Filled 2024-01-24: qty 10

## 2024-01-24 MED ORDER — SODIUM CHLORIDE (PF) 0.9 % IJ SOLN
INTRAMUSCULAR | Status: DC | PRN
  Administered 2024-01-24 (×3): 10 mL

## 2024-01-24 MED ORDER — LIDOCAINE HCL 2 % IJ SOLN
INTRAMUSCULAR | Status: DC | PRN
  Administered 2024-01-24: 10 mL

## 2024-01-24 MED ORDER — FLEET ENEMA RE ENEM
1.0000 | ENEMA | Freq: Once | RECTAL | Status: DC
  Filled 2024-01-24: qty 1

## 2024-01-24 MED ORDER — ONDANSETRON HCL 4 MG/2ML IJ SOLN: 4.0000 mg | Freq: Four times a day (QID) | INTRAMUSCULAR | Status: DC | PRN

## 2024-01-24 MED ORDER — PROPOFOL 500 MG/50ML IV EMUL
INTRAVENOUS | Status: DC | PRN
  Administered 2024-01-24: 40 mg via INTRAVENOUS
  Administered 2024-01-24: 30 mg via INTRAVENOUS

## 2024-01-24 MED ORDER — OXYCODONE HCL 5 MG PO TABS: 5.0000 mg | ORAL_TABLET | Freq: Once | ORAL | Status: DC | PRN

## 2024-01-24 MED ORDER — LIDOCAINE 2% (20 MG/ML) 5 ML SYRINGE
INTRAMUSCULAR | Status: DC | PRN
  Administered 2024-01-24: 60 mg via INTRAVENOUS

## 2024-01-24 MED ORDER — ORAL CARE MOUTH RINSE: 15.0000 mL | Freq: Once | OROMUCOSAL | Status: AC

## 2024-01-24 MED ORDER — SODIUM CHLORIDE (PF) 0.9 % IJ SOLN
INTRAMUSCULAR | Status: AC
Start: 2024-01-24 — End: 2024-01-24
  Filled 2024-01-24: qty 10

## 2024-01-24 MED ORDER — CHLORHEXIDINE GLUCONATE 0.12 % MT SOLN
OROMUCOSAL | Status: DC
Start: 2024-01-24 — End: 2024-01-24
  Filled 2024-01-24: qty 15

## 2024-01-24 MED ORDER — CHLORHEXIDINE GLUCONATE 0.12 % MT SOLN
15.0000 mL | Freq: Once | OROMUCOSAL | Status: AC
  Administered 2024-01-24: 15 mL via OROMUCOSAL

## 2024-01-24 MED ORDER — FENTANYL CITRATE (PF) 100 MCG/2ML IJ SOLN: 25.0000 ug | INTRAMUSCULAR | Status: DC | PRN

## 2024-01-24 MED ORDER — SODIUM CHLORIDE 0.9 % IV SOLN
INTRAVENOUS | Status: DC
Start: 2024-01-24 — End: 2024-01-24

## 2024-01-24 MED ORDER — SODIUM CHLORIDE 0.9 % IV SOLN
2.0000 g | Freq: Once | INTRAVENOUS | Status: AC
  Administered 2024-01-24: 2 g via INTRAVENOUS
  Filled 2024-01-24: qty 20

## 2024-01-24 MED ORDER — PHENYLEPHRINE HCL-NACL 20-0.9 MG/250ML-% IV SOLN
INTRAVENOUS | Status: DC | PRN
  Administered 2024-01-24: 80 ug via INTRAVENOUS
  Administered 2024-01-24: 160 ug via INTRAVENOUS

## 2024-01-24 MED ORDER — PROPOFOL 10 MG/ML IV BOLUS
INTRAVENOUS | Status: DC | PRN
  Administered 2024-01-24: 200 ug/kg/min via INTRAVENOUS

## 2024-01-24 MED ORDER — OXYCODONE HCL 5 MG/5ML PO SOLN: 5.0000 mg | Freq: Once | ORAL | Status: DC | PRN

## 2024-01-24 MED ORDER — MIDAZOLAM HCL 2 MG/2ML IJ SOLN
INTRAMUSCULAR | Status: AC
  Filled 2024-01-24: qty 2

## 2024-01-24 MED ORDER — MIDAZOLAM HCL 2 MG/2ML IJ SOLN
INTRAMUSCULAR | Status: DC | PRN
  Administered 2024-01-24: 2 mg via INTRAVENOUS

## 2024-01-24 SURGICAL SUPPLY — 21 items
BENZOIN TINCTURE PRP APPL 2/3 (GAUZE/BANDAGES/DRESSINGS) IMPLANT
BLADE CLIPPER SENSICLIP SURGIC (BLADE) ×1 IMPLANT
CNTNR URN SCR LID CUP LEK RST (MISCELLANEOUS) ×1 IMPLANT
COVER BACK TABLE 60X90IN (DRAPES) ×1 IMPLANT
DRSG TEGADERM 4X4.75 (GAUZE/BANDAGES/DRESSINGS) ×1 IMPLANT
DRSG TEGADERM 8X12 (GAUZE/BANDAGES/DRESSINGS) ×1 IMPLANT
GAUZE SPONGE 4X4 12PLY STRL (GAUZE/BANDAGES/DRESSINGS) ×1 IMPLANT
GLOVE BIO SURGEON STRL SZ7 (GLOVE) ×1 IMPLANT
IMPL SPACEOAR SYSTEM 10ML (Spacer) IMPLANT
MARKER GOLD PRELOAD 1.2X3 (Urological Implant) ×1 IMPLANT
MARKER SKIN DUAL TIP RULER LAB (MISCELLANEOUS) ×1 IMPLANT
NDL SPNL 22GX3.5 QUINCKE BK (NEEDLE) IMPLANT
NEEDLE SPNL 22GX3.5 QUINCKE BK (NEEDLE) IMPLANT
SHEATH ULTRASOUND LF (SHEATH) IMPLANT
SHEATH ULTRASOUND LTX NONSTRL (SHEATH) IMPLANT
SLEEVE SCD COMPRESS KNEE MED (STOCKING) ×1 IMPLANT
SURGILUBE 2OZ TUBE FLIPTOP (MISCELLANEOUS) ×1 IMPLANT
SYR 10ML LL (SYRINGE) IMPLANT
SYR CONTROL 10ML LL (SYRINGE) ×1 IMPLANT
TOWEL OR 17X24 6PK STRL BLUE (TOWEL DISPOSABLE) ×1 IMPLANT
UNDERPAD 30X36 HEAVY ABSORB (UNDERPADS AND DIAPERS) ×1 IMPLANT

## 2024-01-24 NOTE — Op Note (Signed)
 Preoperative diagnosis: Clinically localized adenocarcinoma of the prostate   Postoperative diagnosis: Clinically localized adenocarcinoma of the prostate  Procedure: 1) Placement of fiducial markers into prostate                    2) Insertion of SpaceOAR hydrogel   Surgeon:Luke Shambhavi Salley. M.D.  Anesthesia: General  EBL: Minimal  Complications: None  Indication: Nicholas Mcintosh is a 58 y.o. gentleman with clinically localized prostate cancer. After discussing management options for treatment, he elected to proceed with radiotherapy. He presents today for the above procedures. The potential risks, complications, alternative options, and expected recovery course have been discussed in detail with the patient and he has provided informed consent to proceed.  Description of procedure: The patient was administered preoperative antibiotics, placed in the dorsal lithotomy position, and prepped and draped in the usual sterile fashion. Next, transrectal ultrasonography was utilized to visualize the prostate. Three gold fiducial markers were then placed into the prostate via transperineal needles under ultrasound guidance at the left apex, left base, and right mid gland under direct ultrasound guidance. A site in the midline was then selected on the perineum for placement of an 18 g needle with saline. The needle was advanced above the rectum and below Denonvillier's fascia to the mid gland and confirmed to be in the midline on transverse imaging. One cc of saline was injected confirming appropriate expansion of this space. A total of 5 cc of saline was then injected to open the space further bilaterally. The saline syringe was then removed and the SpaceOAR hydrogel was injected with good distribution bilaterally. He tolerated the procedure well and without complications. He was given a voiding trial prior to discharge from the PACU.

## 2024-01-24 NOTE — Anesthesia Preprocedure Evaluation (Signed)
 Anesthesia Evaluation  Patient identified by MRN, date of birth, ID band Patient awake    Reviewed: Allergy & Precautions, H&P , NPO status , Patient's Chart, lab work & pertinent test results  History of Anesthesia Complications (+) PONV and history of anesthetic complications  Airway Mallampati: II   Neck ROM: full    Dental   Pulmonary asthma , sleep apnea    breath sounds clear to auscultation       Cardiovascular hypertension, + angina   Rhythm:regular Rate:Normal     Neuro/Psych  PSYCHIATRIC DISORDERS Anxiety      Neuromuscular disease    GI/Hepatic ,GERD  ,,  Endo/Other  Hypothyroidism    Renal/GU      Musculoskeletal   Abdominal   Peds  Hematology   Anesthesia Other Findings   Reproductive/Obstetrics                             Anesthesia Physical Anesthesia Plan  ASA: 3  Anesthesia Plan: MAC   Post-op Pain Management:    Induction: Intravenous  PONV Risk Score and Plan: 2 and Propofol  infusion and Treatment may vary due to age or medical condition  Airway Management Planned: Simple Face Mask  Additional Equipment:   Intra-op Plan:   Post-operative Plan:   Informed Consent: I have reviewed the patients History and Physical, chart, labs and discussed the procedure including the risks, benefits and alternatives for the proposed anesthesia with the patient or authorized representative who has indicated his/her understanding and acceptance.     Dental advisory given  Plan Discussed with: CRNA, Anesthesiologist and Surgeon  Anesthesia Plan Comments:        Anesthesia Quick Evaluation

## 2024-01-24 NOTE — Discharge Instructions (Addendum)
 You should avoid strenuous activities today but may resume all normal activities tomorrow.  2.   You can take Tylenol  as needed for any pain or discomfort.  3.    Follow up with your radiation oncologist for your simulation appointment as scheduled.  If this is not currently scheduled or you do not know the date/time for that appointment, please contact the radiation oncology office to confirm.  Post Anesthesia Home Care Instructions  Activity: Get plenty of rest for the remainder of the day. A responsible adult should stay with you for 24 hours following the procedure.  For the next 24 hours, DO NOT: -Drive a car -Advertising copywriter -Drink alcoholic beverages -Take any medication unless instructed by your physician -Make any legal decisions or sign important papers.  Meals: Start with liquid foods such as gelatin or soup. Progress to regular foods as tolerated. Avoid greasy, spicy, heavy foods. If nausea and/or vomiting occur, drink only clear liquids until the nausea and/or vomiting subsides. Call your physician if vomiting continues.  Special Instructions/Symptoms: Your throat may feel dry or sore from the anesthesia or the breathing tube placed in your throat during surgery. If this causes discomfort, gargle with warm salt water. The discomfort should disappear within 24 hours.  If you had a scopolamine patch placed behind your ear for the management of post- operative nausea and/or vomiting:  1. The medication in the patch is effective for 72 hours, after which it should be removed.  Wrap patch in a tissue and discard in the trash. Wash hands thoroughly with soap and water. 2. You may remove the patch earlier than 72 hours if you experience unpleasant side effects which may include dry mouth, dizziness or visual disturbances. 3. Avoid touching the patch. Wash your hands with soap and water after contact with the patch.  Call your surgeon if you experience:   1.  Fever over  101.0. 2.  Inability to urinate. 3.  Nausea and/or vomiting. 4.  Extreme swelling or bruising at the surgical site. 5.  Continued bleeding from the incision. 6.  Increased pain, redness or drainage from the incision. 7.  Problems related to your pain medication. 8. Any change in color, movement and/or sensation 9. Any problems and/or concerns

## 2024-01-24 NOTE — H&P (Signed)
 H&P  History of Present Illness: Nicholas Mcintosh is a 58 y.o. year old M who presents today for fiducial markers/spaceOAR  Past Medical History:  Diagnosis Date   Chronic back pain    hx fall 2006 from 3 stories , vertebral fx   Complication of anesthesia    ED (erectile dysfunction)    Elevated PSA    Exercise-induced asthma    GERD (gastroesophageal reflux disease)    History of concussion    2006 from fall   History of kidney stones    Hypertension    Hypothyroidism    Nephrolithiasis    bilateral   PONV (postoperative nausea and vomiting)    Right ureteral stone    Sleep apnea    Wears CPAP    Past Surgical History:  Procedure Laterality Date   CARDIAC CATHETERIZATION  07-12-2011   dr levern   abnormal myoview /  normal coronary arteries and normal LVSF   CARDIOVASCULAR STRESS TEST  06-15-2011   dr levern   mild reversible periinfarct anterior wall ischemia, normal wall motion , ef 49%   COLONOSCOPY WITH PROPOFOL  N/A 12/03/2016   Procedure: COLONOSCOPY WITH PROPOFOL ;  Surgeon: Vicci Gladis POUR, MD;  Location: [facility];  Service: Endoscopy;  Laterality: N/A;   CYSTOSCOPY WITH HOLMIUM LASER LITHOTRIPSY Left 02/15/2015   Procedure: CYSTOSCOPY WITH HOLMIUM LASER LITHOTRIPSY;  Surgeon: Arlena Gal, MD;  Location: [facility];  Service: Urology;  Laterality: Left;   CYSTOSCOPY WITH STENT PLACEMENT Left 02/15/2015   Procedure: CYSTOSCOPY WITH STENT PLACEMENT LEFT URETER;  Surgeon: Arlena Gal, MD;  Location: [facility];  Service: Urology;  Laterality: Left;   CYSTOSCOPY/RETROGRADE/URETEROSCOPY/STONE EXTRACTION WITH BASKET Left 02/15/2015   Procedure: CYSTOSCOPY/RETROGRADE/URETEROSCOPY/STONE EXTRACTION WITH BASKET;  Surgeon: Arlena Gal, MD;  Location: [facility];  Service: Urology;  Laterality: Left;   EXTRACORPOREAL SHOCK WAVE LITHOTRIPSY Right 02/03/2018   Procedure: RIGHT EXTRACORPOREAL SHOCK WAVE LITHOTRIPSY (ESWL);  Surgeon: Devere Lonni Righter, MD;   Location: [facility];  Service: Urology;  Laterality: Right;   EXTRACORPOREAL SHOCK WAVE LITHOTRIPSY Left 09/13/2023   Procedure: LEFT EXTRACORPOREAL SHOCK WAVE LITHOTRIPSY (ESWL);  Surgeon: Nieves Cough, MD;  Location: [facility];  Service: Urology;  Laterality: Left;   PROSTATE BIOPSY     VARICOCELECTOMY  2001 ?    Home Medications:  Current Meds  Medication Sig   albuterol  (VENTOLIN  HFA) 108 (90 Base) MCG/ACT inhaler Inhale 2 puffs into the lungs every 4 hours as needed for cough/wheeze   amLODipine -valsartan  (EXFORGE ) 10-320 MG tablet Take 1 tablet by mouth once a day   aspirin EC 81 MG tablet Take 81 mg by mouth daily. Swallow whole.   Azelastine  HCl 137 MCG/SPRAY SOLN Place 2 sprays into both nostrils 2 (two) times daily.   bisacodyl  (DULCOLAX) 10 MG suppository Place 1 suppository (10 mg total) rectally as needed for moderate constipation.   doxazosin  (CARDURA ) 8 MG tablet Take 1 tablet (8 mg total) by mouth daily.   Fluticasone -Umeclidin-Vilant (TRELEGY ELLIPTA ) 200-62.5-25 MCG/ACT AEPB Inhale 1 puff into the lungs daily.   folic acid  (FOLVITE ) 1 MG tablet Take 1 tablet (1 mg total) by mouth daily.   levocetirizine (XYZAL ) 5 MG tablet Take 1 tablet (5 mg total) by mouth daily.   levothyroxine  (SYNTHROID ) 50 MCG tablet Take 1 tablet (50 mcg total) by mouth every morning.   linaclotide  (LINZESS ) 145 MCG CAPS capsule Take 1 capsule by mouth once daily with first meal   meloxicam  (MOBIC ) 15 MG tablet Take 1 tablet (15 mg  total) by mouth daily as needed.   montelukast  (SINGULAIR ) 10 MG tablet Take 1 tablet (10 mg total) by mouth daily.   omeprazole  (PRILOSEC) 40 MG capsule Take 1 capsule (40 mg total) by mouth daily.   oxyCODONE -acetaminophen  (PERCOCET/ROXICET) 5-325 MG tablet Take 1 tablet by mouth 3 (three) times daily as needed.   oxyCODONE -acetaminophen  (PERCOCET/ROXICET) 5-325 MG tablet Take 1 tablet by mouth 3 (three) times daily as needed.    Allergies:   Allergies  Allergen Reactions   Molds & Smuts     Asthma attacks around molds   Dust Mite Extract Other (See Comments)    Ashtma    Family History  Problem Relation Age of Onset   Hypertension Mother    Cancer Maternal Aunt        Breast    Social History:  reports that he has never smoked. He has never used smokeless tobacco. He reports that he does not drink alcohol  and does not use drugs.  ROS: A complete review of systems was performed.  All systems are negative except for pertinent findings as noted.  Physical Exam:  Vital signs in last 24 hours: Temp:  [98.3 F (36.8 C)] 98.3 F (36.8 C) (06/27 0945) Pulse Rate:  [67] 67 (06/27 0945) Resp:  [18] 18 (06/27 0945) BP: (147)/(83) 147/83 (06/27 0945) SpO2:  [97 %] 97 % (06/27 0945) Weight:  [111.1 kg] 111.1 kg (06/27 0945) Constitutional:  Alert and oriented, No acute distress Cardiovascular: Regular rate and rhythm Respiratory: Normal respiratory effort, Lungs clear bilaterally GI: Abdomen is soft, nontender, nondistended, no abdominal masses Lymphatic: No lymphadenopathy Neurologic: Grossly intact, no focal deficits Psychiatric: Normal mood and affect   Laboratory Data:  No results for input(s): WBC, HGB, HCT, PLT in the last 72 hours.  No results for input(s): NA, K, CL, GLUCOSE, BUN, CALCIUM , CREATININE in the last 72 hours.  Invalid input(s): CO3   No results found for this or any previous visit (from the past 24 hours). No results found for this or any previous visit (from the past 240 hours).  Renal Function: No results for input(s): CREATININE in the last 168 hours. CrCl cannot be calculated (Patient's most recent lab result is older than the maximum 21 days allowed.).  Radiologic Imaging: No results found.  Assessment:  Nicholas Mcintosh is a 58 y.o. year old M with prostate cancer, here today for spaceOAR/fiducial markers  Plan:  To OR as planned. Procedure and risks  reviewed (including but not limited bleeding, infection, retention, rectal injury/ulcer, failure to complete procedure)     Herlene Foot, MD 01/24/2024, 10:16 AM  [facility] Pager: [phone]

## 2024-01-24 NOTE — Transfer of Care (Signed)
 Immediate Anesthesia Transfer of Care Note  Patient: Nicholas Mcintosh  Procedure(s) Performed: INSERTION, GOLD SEEDS (Prostate) INJECTION, HYDROGEL SPACER (Prostate)  Patient Location: PACU  Anesthesia Type:MAC  Level of Consciousness: drowsy  Airway & Oxygen Therapy: Patient Spontanous Breathing  Post-op Assessment: Report given to RN  Post vital signs: Reviewed and stable  Last Vitals:  Vitals Value Taken Time  BP 103/64 01/24/24 12:05  Temp    Pulse 84 01/24/24 12:06  Resp 25 01/24/24 12:06  SpO2 99 % 01/24/24 12:06  Vitals shown include unfiled device data.  Last Pain:  Vitals:   01/24/24 1003  TempSrc:   PainSc: 0-No pain      Patients Stated Pain Goal: 5 (01/24/24 1003)  Complications: No notable events documented.

## 2024-01-24 NOTE — Anesthesia Postprocedure Evaluation (Signed)
 Anesthesia Post Note  Patient: [employer]  Procedure(s) Performed: INSERTION, GOLD SEEDS (Prostate) INJECTION, HYDROGEL SPACER (Prostate)     Patient location during evaluation: PACU Anesthesia Type: MAC Level of consciousness: awake and alert Pain management: pain level controlled Vital Signs Assessment: post-procedure vital signs reviewed and stable Respiratory status: spontaneous breathing, nonlabored ventilation, respiratory function stable and patient connected to nasal cannula oxygen Cardiovascular status: stable and blood pressure returned to baseline Postop Assessment: no apparent nausea or vomiting Anesthetic complications: no   No notable events documented.  Last Vitals:  Vitals:   01/24/24 1215 01/24/24 1230  BP: 107/77   Pulse: 69 60  Resp: 17 15  Temp:    SpO2: 97% 93%    Last Pain:  Vitals:   01/24/24 1230  TempSrc:   PainSc: 3                  Ashby Moskal S

## 2024-01-27 ENCOUNTER — Encounter (HOSPITAL_COMMUNITY): Payer: Self-pay | Admitting: Urology

## 2024-01-27 ENCOUNTER — Telehealth: Payer: Self-pay | Admitting: *Deleted

## 2024-01-27 ENCOUNTER — Other Ambulatory Visit (HOSPITAL_COMMUNITY): Payer: Self-pay

## 2024-01-27 DIAGNOSIS — J3089 Other allergic rhinitis: Secondary | ICD-10-CM | POA: Diagnosis not present

## 2024-01-27 DIAGNOSIS — C61 Malignant neoplasm of prostate: Secondary | ICD-10-CM | POA: Diagnosis not present

## 2024-01-27 DIAGNOSIS — Z191 Hormone sensitive malignancy status: Secondary | ICD-10-CM | POA: Diagnosis not present

## 2024-01-27 DIAGNOSIS — J301 Allergic rhinitis due to pollen: Secondary | ICD-10-CM | POA: Diagnosis not present

## 2024-01-27 DIAGNOSIS — J3081 Allergic rhinitis due to animal (cat) (dog) hair and dander: Secondary | ICD-10-CM | POA: Diagnosis not present

## 2024-01-27 NOTE — Progress Notes (Signed)
  Radiation Oncology         [phone] ________________________________  Name: Nicholas Mcintosh MRN: [id number]  Date: 01/28/2024  DOB: July 12, 1966  SIMULATION AND TREATMENT PLANNING NOTE    ICD-10-CM   1. Malignant neoplasm of prostate (HCC)  C61       DIAGNOSIS:  58 y.o. gentleman with Stage T1c adenocarcinoma of the prostate with Gleason score of 3+4, and PSA of 3.08.  NARRATIVE:  The patient was brought to the CT Simulation planning suite.  Identity was confirmed.  All relevant records and images related to the planned course of therapy were reviewed.  The patient freely provided informed written consent to proceed with treatment after reviewing the details related to the planned course of therapy. The consent form was witnessed and verified by the simulation staff.  Then, the patient was set-up in a stable reproducible supine position for radiation therapy.  A vacuum lock pillow device was custom fabricated to position his legs in a reproducible immobilized position.  Then, supervised the performance of a urethrogram under sterile conditions to identify the prostatic apex.  CT images were obtained.  Surface markings were placed.  The CT images were loaded into the planning software.  Then the prostate target and avoidance structures including the rectum, bladder, bowel and hips were contoured.  Treatment planning then occurred.  The radiation prescription was entered and confirmed.  A total of one complex treatment devices was fabricated. I have requested : Intensity Modulated Radiotherapy (IMRT) is medically necessary for this case for the following reason:  Rectal sparing.  I have requested daily cone beam CT volumetric image gudiance to track gold fiducial posiitoning along with bladder and rectal filling, this is medically necessary to assure accurate positioning of high dose radiation.  PLAN:  The patient will receive 70 Gy in 28 fractions.  ________________________________  [name]  [name], M.D.

## 2024-01-27 NOTE — Telephone Encounter (Signed)
 CALLED PATIENT TO REMIND OF SIM AND MRI FOR 01-28-24- ARRIVAL TIME- 9:45 AM @ [facility], INFORMED PATIENT TO ARRIVE WITH A FULL BLADDER, ALSO HIS MRI @ [facility]- ARRIVAL TIME- 11:30 AM @ [facility], LVM FOR A RETURN CALL

## 2024-01-28 ENCOUNTER — Telehealth: Payer: Self-pay | Admitting: *Deleted

## 2024-01-28 ENCOUNTER — Ambulatory Visit
Admission: RE | Admit: 2024-01-28 | Discharge: 2024-01-28 | Disposition: A | Discharge status: Home / Self Care | Source: Ambulatory Visit | Attending: Urology | Admitting: Urology

## 2024-01-28 ENCOUNTER — Ambulatory Visit (HOSPITAL_COMMUNITY): Admission: RE | Admit: 2024-01-28 | Source: Ambulatory Visit

## 2024-01-28 DIAGNOSIS — C61 Malignant neoplasm of prostate: Secondary | ICD-10-CM | POA: Insufficient documentation

## 2024-01-28 DIAGNOSIS — Z191 Hormone sensitive malignancy status: Secondary | ICD-10-CM | POA: Diagnosis not present

## 2024-01-28 DIAGNOSIS — Z510 Encounter for antineoplastic radiation therapy: Secondary | ICD-10-CM | POA: Insufficient documentation

## 2024-01-28 NOTE — Telephone Encounter (Signed)
 Called patient to ask about rescheduling missed MRI, patient agreed to have this tomorrow, appt. has been booked for 01-29-24- arrival time- 9:45 am @ [facility], spoke with patient and he is aware of this scan and is agreeable to have this done tomorrow

## 2024-01-29 ENCOUNTER — Ambulatory Visit (HOSPITAL_COMMUNITY)
Admission: RE | Admit: 2024-01-29 | Discharge: 2024-01-29 | Disposition: A | Discharge status: Home / Self Care | Source: Ambulatory Visit | Attending: Urology | Admitting: Urology

## 2024-01-29 ENCOUNTER — Other Ambulatory Visit (HOSPITAL_COMMUNITY): Payer: Self-pay

## 2024-01-29 DIAGNOSIS — C61 Malignant neoplasm of prostate: Secondary | ICD-10-CM | POA: Insufficient documentation

## 2024-01-31 DIAGNOSIS — G4733 Obstructive sleep apnea (adult) (pediatric): Secondary | ICD-10-CM | POA: Diagnosis not present

## 2024-02-03 DIAGNOSIS — Z510 Encounter for antineoplastic radiation therapy: Secondary | ICD-10-CM | POA: Diagnosis not present

## 2024-02-03 DIAGNOSIS — Z191 Hormone sensitive malignancy status: Secondary | ICD-10-CM | POA: Diagnosis not present

## 2024-02-03 DIAGNOSIS — C61 Malignant neoplasm of prostate: Secondary | ICD-10-CM | POA: Diagnosis not present

## 2024-02-05 DIAGNOSIS — J301 Allergic rhinitis due to pollen: Secondary | ICD-10-CM | POA: Diagnosis not present

## 2024-02-05 DIAGNOSIS — J3081 Allergic rhinitis due to animal (cat) (dog) hair and dander: Secondary | ICD-10-CM | POA: Diagnosis not present

## 2024-02-05 DIAGNOSIS — J3089 Other allergic rhinitis: Secondary | ICD-10-CM | POA: Diagnosis not present

## 2024-02-07 ENCOUNTER — Other Ambulatory Visit (HOSPITAL_COMMUNITY): Payer: Self-pay

## 2024-02-07 MED ORDER — ALBUTEROL SULFATE HFA 108 (90 BASE) MCG/ACT IN AERS
2.0000 | INHALATION_SPRAY | RESPIRATORY_TRACT | 2 refills | Status: AC | PRN
  Filled 2024-02-07: qty 6.7, 30d supply, fill #0

## 2024-02-10 ENCOUNTER — Other Ambulatory Visit: Payer: Self-pay

## 2024-02-10 ENCOUNTER — Ambulatory Visit
Admission: RE | Admit: 2024-02-10 | Discharge: 2024-02-10 | Disposition: A | Discharge status: Home / Self Care | Source: Ambulatory Visit | Attending: Radiation Oncology

## 2024-02-10 DIAGNOSIS — J3081 Allergic rhinitis due to animal (cat) (dog) hair and dander: Secondary | ICD-10-CM | POA: Diagnosis not present

## 2024-02-10 DIAGNOSIS — Z191 Hormone sensitive malignancy status: Secondary | ICD-10-CM | POA: Diagnosis not present

## 2024-02-10 DIAGNOSIS — J301 Allergic rhinitis due to pollen: Secondary | ICD-10-CM | POA: Diagnosis not present

## 2024-02-10 DIAGNOSIS — C61 Malignant neoplasm of prostate: Secondary | ICD-10-CM | POA: Diagnosis not present

## 2024-02-10 DIAGNOSIS — Z510 Encounter for antineoplastic radiation therapy: Secondary | ICD-10-CM | POA: Diagnosis not present

## 2024-02-10 DIAGNOSIS — J3089 Other allergic rhinitis: Secondary | ICD-10-CM | POA: Diagnosis not present

## 2024-02-10 LAB — RAD ONC ARIA SESSION SUMMARY
Course Elapsed Days: 0
Plan Fractions Treated to Date: 1
Plan Prescribed Dose Per Fraction: 2.5 Gy
Plan Total Fractions Prescribed: 28
Plan Total Prescribed Dose: 70 Gy
Reference Point Dosage Given to Date: 2.5 Gy
Reference Point Session Dosage Given: 2.5 Gy
Session Number: 1

## 2024-02-11 ENCOUNTER — Ambulatory Visit
Admission: RE | Admit: 2024-02-11 | Discharge: 2024-02-11 | Disposition: A | Discharge status: Home / Self Care | Source: Ambulatory Visit | Attending: Radiation Oncology

## 2024-02-11 ENCOUNTER — Other Ambulatory Visit: Payer: Self-pay

## 2024-02-11 DIAGNOSIS — Z191 Hormone sensitive malignancy status: Secondary | ICD-10-CM | POA: Diagnosis not present

## 2024-02-11 DIAGNOSIS — C61 Malignant neoplasm of prostate: Secondary | ICD-10-CM | POA: Diagnosis not present

## 2024-02-11 DIAGNOSIS — Z510 Encounter for antineoplastic radiation therapy: Secondary | ICD-10-CM | POA: Diagnosis not present

## 2024-02-11 LAB — RAD ONC ARIA SESSION SUMMARY
Course Elapsed Days: 1
Plan Fractions Treated to Date: 2
Plan Prescribed Dose Per Fraction: 2.5 Gy
Plan Total Fractions Prescribed: 28
Plan Total Prescribed Dose: 70 Gy
Reference Point Dosage Given to Date: 5 Gy
Reference Point Session Dosage Given: 2.5 Gy
Session Number: 2

## 2024-02-12 ENCOUNTER — Ambulatory Visit
Admission: RE | Admit: 2024-02-12 | Discharge: 2024-02-12 | Disposition: A | Discharge status: Home / Self Care | Source: Ambulatory Visit | Attending: Radiation Oncology | Admitting: Radiation Oncology

## 2024-02-12 ENCOUNTER — Other Ambulatory Visit: Payer: Self-pay

## 2024-02-12 DIAGNOSIS — Z510 Encounter for antineoplastic radiation therapy: Secondary | ICD-10-CM | POA: Diagnosis not present

## 2024-02-12 DIAGNOSIS — C61 Malignant neoplasm of prostate: Secondary | ICD-10-CM | POA: Diagnosis not present

## 2024-02-12 DIAGNOSIS — Z191 Hormone sensitive malignancy status: Secondary | ICD-10-CM | POA: Diagnosis not present

## 2024-02-12 LAB — RAD ONC ARIA SESSION SUMMARY
Course Elapsed Days: 2
Plan Fractions Treated to Date: 3
Plan Prescribed Dose Per Fraction: 2.5 Gy
Plan Total Fractions Prescribed: 28
Plan Total Prescribed Dose: 70 Gy
Reference Point Dosage Given to Date: 7.5 Gy
Reference Point Session Dosage Given: 2.5 Gy
Session Number: 3

## 2024-02-13 ENCOUNTER — Ambulatory Visit
Admission: RE | Admit: 2024-02-13 | Discharge: 2024-02-13 | Disposition: A | Discharge status: Home / Self Care | Source: Ambulatory Visit | Attending: Radiation Oncology | Admitting: Radiation Oncology

## 2024-02-13 ENCOUNTER — Other Ambulatory Visit: Payer: Self-pay

## 2024-02-13 DIAGNOSIS — C61 Malignant neoplasm of prostate: Secondary | ICD-10-CM | POA: Diagnosis not present

## 2024-02-13 DIAGNOSIS — Z191 Hormone sensitive malignancy status: Secondary | ICD-10-CM | POA: Diagnosis not present

## 2024-02-13 DIAGNOSIS — Z510 Encounter for antineoplastic radiation therapy: Secondary | ICD-10-CM | POA: Diagnosis not present

## 2024-02-13 LAB — RAD ONC ARIA SESSION SUMMARY
Course Elapsed Days: 3
Plan Fractions Treated to Date: 4
Plan Prescribed Dose Per Fraction: 2.5 Gy
Plan Total Fractions Prescribed: 28
Plan Total Prescribed Dose: 70 Gy
Reference Point Dosage Given to Date: 10 Gy
Reference Point Session Dosage Given: 2.5 Gy
Session Number: 4

## 2024-02-14 ENCOUNTER — Ambulatory Visit

## 2024-02-14 ENCOUNTER — Ambulatory Visit
Admission: RE | Admit: 2024-02-14 | Discharge: 2024-02-14 | Disposition: A | Discharge status: Home / Self Care | Source: Ambulatory Visit | Attending: Radiation Oncology | Admitting: Radiation Oncology

## 2024-02-14 ENCOUNTER — Other Ambulatory Visit: Payer: Self-pay

## 2024-02-14 DIAGNOSIS — Z510 Encounter for antineoplastic radiation therapy: Secondary | ICD-10-CM | POA: Diagnosis not present

## 2024-02-14 DIAGNOSIS — C61 Malignant neoplasm of prostate: Secondary | ICD-10-CM | POA: Diagnosis not present

## 2024-02-14 DIAGNOSIS — Z191 Hormone sensitive malignancy status: Secondary | ICD-10-CM | POA: Diagnosis not present

## 2024-02-14 LAB — RAD ONC ARIA SESSION SUMMARY
Course Elapsed Days: 4
Plan Fractions Treated to Date: 5
Plan Prescribed Dose Per Fraction: 2.5 Gy
Plan Total Fractions Prescribed: 28
Plan Total Prescribed Dose: 70 Gy
Reference Point Dosage Given to Date: 12.5 Gy
Reference Point Session Dosage Given: 2.5 Gy
Session Number: 5

## 2024-02-17 ENCOUNTER — Telehealth: Payer: Self-pay

## 2024-02-17 ENCOUNTER — Ambulatory Visit
Admission: RE | Admit: 2024-02-17 | Discharge: 2024-02-17 | Disposition: A | Discharge status: Home / Self Care | Source: Ambulatory Visit | Attending: Radiation Oncology

## 2024-02-17 ENCOUNTER — Other Ambulatory Visit: Payer: Self-pay

## 2024-02-17 DIAGNOSIS — Z191 Hormone sensitive malignancy status: Secondary | ICD-10-CM | POA: Diagnosis not present

## 2024-02-17 DIAGNOSIS — Z510 Encounter for antineoplastic radiation therapy: Secondary | ICD-10-CM | POA: Diagnosis not present

## 2024-02-17 DIAGNOSIS — R300 Dysuria: Secondary | ICD-10-CM

## 2024-02-17 DIAGNOSIS — C61 Malignant neoplasm of prostate: Secondary | ICD-10-CM | POA: Diagnosis not present

## 2024-02-17 LAB — RAD ONC ARIA SESSION SUMMARY
Course Elapsed Days: 7
Plan Fractions Treated to Date: 6
Plan Prescribed Dose Per Fraction: 2.5 Gy
Plan Total Fractions Prescribed: 28
Plan Total Prescribed Dose: 70 Gy
Reference Point Dosage Given to Date: 15 Gy
Reference Point Session Dosage Given: 2.5 Gy
Session Number: 6

## 2024-02-17 NOTE — Telephone Encounter (Signed)
 Mr Nicholas Mcintosh called with complaints of dysuria, rectal burning, and diarrhea requesting treatment options.  RN advised Mr. Nicholas Mcintosh to get some preparation-H suppositories/cream for internal/external rectal burning, AZO for the dysuria and increase water intake, and Imodium AD for the diarrhea.  [name] was advised when he comes in for next radiation treatment to come by nursing to give urine sample to rule out bacterial infection.  Mr. Nicholas Mcintosh was appreciative and verbalized understanding that he will go to local pharmacy to pick up these OTC options for treatment.

## 2024-02-18 ENCOUNTER — Ambulatory Visit

## 2024-02-18 ENCOUNTER — Other Ambulatory Visit: Payer: Self-pay

## 2024-02-18 ENCOUNTER — Ambulatory Visit: Payer: Self-pay

## 2024-02-18 ENCOUNTER — Ambulatory Visit
Admission: RE | Admit: 2024-02-18 | Discharge: 2024-02-18 | Disposition: A | Discharge status: Home / Self Care | Source: Ambulatory Visit | Attending: Radiation Oncology | Admitting: Radiation Oncology

## 2024-02-18 DIAGNOSIS — Z191 Hormone sensitive malignancy status: Secondary | ICD-10-CM | POA: Diagnosis not present

## 2024-02-18 DIAGNOSIS — Z510 Encounter for antineoplastic radiation therapy: Secondary | ICD-10-CM | POA: Diagnosis not present

## 2024-02-18 DIAGNOSIS — C61 Malignant neoplasm of prostate: Secondary | ICD-10-CM | POA: Diagnosis not present

## 2024-02-18 DIAGNOSIS — R300 Dysuria: Secondary | ICD-10-CM

## 2024-02-18 LAB — URINALYSIS, COMPLETE (UACMP) WITH MICROSCOPIC
Bacteria, UA: NONE SEEN
Bilirubin Urine: NEGATIVE
Glucose, UA: NEGATIVE mg/dL
Ketones, ur: NEGATIVE mg/dL
Nitrite: NEGATIVE
Protein, ur: NEGATIVE mg/dL
Specific Gravity, Urine: 1.014 (ref 1.005–1.030)
pH: 7 (ref 5.0–8.0)

## 2024-02-18 LAB — RAD ONC ARIA SESSION SUMMARY
Course Elapsed Days: 8
Plan Fractions Treated to Date: 7
Plan Prescribed Dose Per Fraction: 2.5 Gy
Plan Total Fractions Prescribed: 28
Plan Total Prescribed Dose: 70 Gy
Reference Point Dosage Given to Date: 17.5 Gy
Reference Point Session Dosage Given: 2.5 Gy
Session Number: 7

## 2024-02-18 NOTE — Telephone Encounter (Signed)
 RN called Mr. Nicholas Mcintosh to give him results of his urinalysis (negative).  He was appreciative for the call.  No questions at [facility] time.  Mr. Nicholas Mcintosh was inform that we will be waiting for urine culture result.

## 2024-02-19 ENCOUNTER — Telehealth: Payer: Self-pay

## 2024-02-19 ENCOUNTER — Other Ambulatory Visit (HOSPITAL_COMMUNITY): Payer: Self-pay

## 2024-02-19 ENCOUNTER — Ambulatory Visit

## 2024-02-19 DIAGNOSIS — R3912 Poor urinary stream: Secondary | ICD-10-CM | POA: Diagnosis not present

## 2024-02-19 DIAGNOSIS — R300 Dysuria: Secondary | ICD-10-CM | POA: Diagnosis not present

## 2024-02-19 DIAGNOSIS — N401 Enlarged prostate with lower urinary tract symptoms: Secondary | ICD-10-CM | POA: Diagnosis not present

## 2024-02-19 DIAGNOSIS — R350 Frequency of micturition: Secondary | ICD-10-CM | POA: Diagnosis not present

## 2024-02-19 DIAGNOSIS — R8271 Bacteriuria: Secondary | ICD-10-CM | POA: Diagnosis not present

## 2024-02-19 LAB — URINE CULTURE: Culture: NO GROWTH

## 2024-02-19 MED ORDER — SILODOSIN 8 MG PO CAPS
8.0000 mg | ORAL_CAPSULE | Freq: Every day | ORAL | 11 refills | Status: AC
  Filled 2024-02-19: qty 30, 30d supply, fill #0
  Filled 2024-03-15: qty 30, 30d supply, fill #1
  Filled 2024-03-19: qty 3, 3d supply, fill #1
  Filled 2024-03-19: qty 87, 87d supply, fill #1
  Filled 2024-06-05: qty 90, 90d supply, fill #2

## 2024-02-19 NOTE — Telephone Encounter (Signed)
 Patient identity verified x2. Patient reports prostatitis and a placid urine stream x a week. Requesting Rx's. I have notified Dr. Maralyn team at [facility] and they will see him within a week. Dr. Maralyn team has confirmed this info and apt with the patient.  This concludes the interaction.  Rosaline Minerva, LPN

## 2024-02-19 NOTE — Progress Notes (Signed)
 Please call patient with normal result.  Thanks. MM

## 2024-02-20 ENCOUNTER — Other Ambulatory Visit: Payer: Self-pay

## 2024-02-20 ENCOUNTER — Ambulatory Visit
Admission: RE | Admit: 2024-02-20 | Discharge: 2024-02-20 | Disposition: A | Discharge status: Home / Self Care | Source: Ambulatory Visit | Attending: Radiation Oncology

## 2024-02-20 DIAGNOSIS — Z191 Hormone sensitive malignancy status: Secondary | ICD-10-CM | POA: Diagnosis not present

## 2024-02-20 DIAGNOSIS — Z510 Encounter for antineoplastic radiation therapy: Secondary | ICD-10-CM | POA: Diagnosis not present

## 2024-02-20 DIAGNOSIS — C61 Malignant neoplasm of prostate: Secondary | ICD-10-CM | POA: Diagnosis not present

## 2024-02-20 LAB — RAD ONC ARIA SESSION SUMMARY
Course Elapsed Days: 10
Plan Fractions Treated to Date: 8
Plan Prescribed Dose Per Fraction: 2.5 Gy
Plan Total Fractions Prescribed: 28
Plan Total Prescribed Dose: 70 Gy
Reference Point Dosage Given to Date: 20 Gy
Reference Point Session Dosage Given: 2.5 Gy
Session Number: 8

## 2024-02-21 ENCOUNTER — Ambulatory Visit
Admission: RE | Admit: 2024-02-21 | Discharge: 2024-02-21 | Disposition: A | Discharge status: Home / Self Care | Source: Ambulatory Visit | Attending: Radiation Oncology | Admitting: Radiation Oncology

## 2024-02-21 ENCOUNTER — Other Ambulatory Visit: Payer: Self-pay

## 2024-02-21 DIAGNOSIS — Z510 Encounter for antineoplastic radiation therapy: Secondary | ICD-10-CM | POA: Diagnosis not present

## 2024-02-21 DIAGNOSIS — C61 Malignant neoplasm of prostate: Secondary | ICD-10-CM | POA: Diagnosis not present

## 2024-02-21 DIAGNOSIS — Z191 Hormone sensitive malignancy status: Secondary | ICD-10-CM | POA: Diagnosis not present

## 2024-02-21 LAB — RAD ONC ARIA SESSION SUMMARY
Course Elapsed Days: 11
Plan Fractions Treated to Date: 9
Plan Prescribed Dose Per Fraction: 2.5 Gy
Plan Total Fractions Prescribed: 28
Plan Total Prescribed Dose: 70 Gy
Reference Point Dosage Given to Date: 22.5 Gy
Reference Point Session Dosage Given: 2.5 Gy
Session Number: 9

## 2024-02-24 ENCOUNTER — Other Ambulatory Visit (HOSPITAL_COMMUNITY): Payer: Self-pay

## 2024-02-24 ENCOUNTER — Ambulatory Visit
Admission: RE | Admit: 2024-02-24 | Discharge: 2024-02-24 | Disposition: A | Discharge status: Home / Self Care | Source: Ambulatory Visit | Attending: Radiation Oncology

## 2024-02-24 ENCOUNTER — Telehealth: Payer: Self-pay | Admitting: *Deleted

## 2024-02-24 ENCOUNTER — Other Ambulatory Visit: Payer: Self-pay

## 2024-02-24 ENCOUNTER — Other Ambulatory Visit (HOSPITAL_COMMUNITY)

## 2024-02-24 ENCOUNTER — Ambulatory Visit: Admitting: Radiation Oncology

## 2024-02-24 DIAGNOSIS — Z191 Hormone sensitive malignancy status: Secondary | ICD-10-CM | POA: Diagnosis not present

## 2024-02-24 DIAGNOSIS — C61 Malignant neoplasm of prostate: Secondary | ICD-10-CM | POA: Diagnosis not present

## 2024-02-24 DIAGNOSIS — Z510 Encounter for antineoplastic radiation therapy: Secondary | ICD-10-CM | POA: Diagnosis not present

## 2024-02-24 DIAGNOSIS — J3081 Allergic rhinitis due to animal (cat) (dog) hair and dander: Secondary | ICD-10-CM | POA: Diagnosis not present

## 2024-02-24 DIAGNOSIS — J3089 Other allergic rhinitis: Secondary | ICD-10-CM | POA: Diagnosis not present

## 2024-02-24 DIAGNOSIS — J301 Allergic rhinitis due to pollen: Secondary | ICD-10-CM | POA: Diagnosis not present

## 2024-02-24 LAB — RAD ONC ARIA SESSION SUMMARY
Course Elapsed Days: 14
Plan Fractions Treated to Date: 10
Plan Prescribed Dose Per Fraction: 2.5 Gy
Plan Total Fractions Prescribed: 28
Plan Total Prescribed Dose: 70 Gy
Reference Point Dosage Given to Date: 25 Gy
Reference Point Session Dosage Given: 2.5 Gy
Session Number: 10

## 2024-02-24 MED ORDER — OXYCODONE-ACETAMINOPHEN 5-325 MG PO TABS
1.0000 | ORAL_TABLET | Freq: Three times a day (TID) | ORAL | 0 refills | Status: DC
  Filled 2024-02-24: qty 90, 30d supply, fill #0

## 2024-02-24 NOTE — Telephone Encounter (Signed)
 CALLED PATIENT TO INFORM THAT HE DOESN'T NEED ANOTHER MRI DUE TO HAVING ALREADY HAVING ONE.

## 2024-02-25 ENCOUNTER — Other Ambulatory Visit: Payer: Self-pay

## 2024-02-25 ENCOUNTER — Ambulatory Visit
Admission: RE | Admit: 2024-02-25 | Discharge: 2024-02-25 | Disposition: A | Discharge status: Home / Self Care | Source: Ambulatory Visit | Attending: Radiation Oncology

## 2024-02-25 DIAGNOSIS — Z510 Encounter for antineoplastic radiation therapy: Secondary | ICD-10-CM | POA: Diagnosis not present

## 2024-02-25 DIAGNOSIS — Z191 Hormone sensitive malignancy status: Secondary | ICD-10-CM | POA: Diagnosis not present

## 2024-02-25 DIAGNOSIS — C61 Malignant neoplasm of prostate: Secondary | ICD-10-CM | POA: Diagnosis not present

## 2024-02-25 LAB — RAD ONC ARIA SESSION SUMMARY
Course Elapsed Days: 15
Plan Fractions Treated to Date: 11
Plan Prescribed Dose Per Fraction: 2.5 Gy
Plan Total Fractions Prescribed: 28
Plan Total Prescribed Dose: 70 Gy
Reference Point Dosage Given to Date: 27.5 Gy
Reference Point Session Dosage Given: 2.5 Gy
Session Number: 11

## 2024-02-26 ENCOUNTER — Ambulatory Visit
Admission: RE | Admit: 2024-02-26 | Discharge: 2024-02-26 | Disposition: A | Discharge status: Home / Self Care | Source: Ambulatory Visit | Attending: Radiation Oncology | Admitting: Radiation Oncology

## 2024-02-26 ENCOUNTER — Other Ambulatory Visit (HOSPITAL_COMMUNITY): Payer: Self-pay

## 2024-02-26 ENCOUNTER — Other Ambulatory Visit: Payer: Self-pay

## 2024-02-26 DIAGNOSIS — Z191 Hormone sensitive malignancy status: Secondary | ICD-10-CM | POA: Diagnosis not present

## 2024-02-26 DIAGNOSIS — C61 Malignant neoplasm of prostate: Secondary | ICD-10-CM | POA: Diagnosis not present

## 2024-02-26 DIAGNOSIS — Z510 Encounter for antineoplastic radiation therapy: Secondary | ICD-10-CM | POA: Diagnosis not present

## 2024-02-26 LAB — RAD ONC ARIA SESSION SUMMARY
Course Elapsed Days: 16
Plan Fractions Treated to Date: 12
Plan Prescribed Dose Per Fraction: 2.5 Gy
Plan Total Fractions Prescribed: 28
Plan Total Prescribed Dose: 70 Gy
Reference Point Dosage Given to Date: 30 Gy
Reference Point Session Dosage Given: 2.5 Gy
Session Number: 12

## 2024-02-26 MED ORDER — LEVOTHYROXINE SODIUM 50 MCG PO TABS
50.0000 ug | ORAL_TABLET | Freq: Every morning | ORAL | 3 refills | Status: AC
  Filled 2024-02-26: qty 90, 90d supply, fill #0
  Filled 2024-06-16: qty 90, 90d supply, fill #1

## 2024-02-27 ENCOUNTER — Ambulatory Visit: Admitting: Radiation Oncology

## 2024-02-27 ENCOUNTER — Other Ambulatory Visit: Payer: Self-pay

## 2024-02-27 ENCOUNTER — Ambulatory Visit
Admission: RE | Admit: 2024-02-27 | Discharge: 2024-02-27 | Disposition: A | Discharge status: Home / Self Care | Source: Ambulatory Visit | Attending: Radiation Oncology | Admitting: Radiation Oncology

## 2024-02-27 DIAGNOSIS — Z510 Encounter for antineoplastic radiation therapy: Secondary | ICD-10-CM | POA: Diagnosis not present

## 2024-02-27 DIAGNOSIS — Z191 Hormone sensitive malignancy status: Secondary | ICD-10-CM | POA: Diagnosis not present

## 2024-02-27 DIAGNOSIS — C61 Malignant neoplasm of prostate: Secondary | ICD-10-CM | POA: Diagnosis not present

## 2024-02-27 LAB — RAD ONC ARIA SESSION SUMMARY
Course Elapsed Days: 17
Plan Fractions Treated to Date: 13
Plan Prescribed Dose Per Fraction: 2.5 Gy
Plan Total Fractions Prescribed: 28
Plan Total Prescribed Dose: 70 Gy
Reference Point Dosage Given to Date: 32.5 Gy
Reference Point Session Dosage Given: 2.5 Gy
Session Number: 13

## 2024-02-28 ENCOUNTER — Ambulatory Visit

## 2024-03-02 ENCOUNTER — Ambulatory Visit
Admission: RE | Admit: 2024-03-02 | Discharge: 2024-03-02 | Disposition: A | Discharge status: Home / Self Care | Source: Ambulatory Visit | Attending: Radiation Oncology | Admitting: Radiation Oncology

## 2024-03-02 ENCOUNTER — Ambulatory Visit
Admission: RE | Admit: 2024-03-02 | Discharge: 2024-03-02 | Disposition: A | Discharge status: Home / Self Care | Source: Ambulatory Visit | Attending: Radiation Oncology

## 2024-03-02 ENCOUNTER — Other Ambulatory Visit: Payer: Self-pay

## 2024-03-02 DIAGNOSIS — Z191 Hormone sensitive malignancy status: Secondary | ICD-10-CM | POA: Diagnosis not present

## 2024-03-02 DIAGNOSIS — G4733 Obstructive sleep apnea (adult) (pediatric): Secondary | ICD-10-CM | POA: Diagnosis not present

## 2024-03-02 DIAGNOSIS — Z510 Encounter for antineoplastic radiation therapy: Secondary | ICD-10-CM | POA: Diagnosis not present

## 2024-03-02 DIAGNOSIS — C61 Malignant neoplasm of prostate: Secondary | ICD-10-CM | POA: Insufficient documentation

## 2024-03-02 LAB — RAD ONC ARIA SESSION SUMMARY
Course Elapsed Days: 21
Plan Fractions Treated to Date: 14
Plan Prescribed Dose Per Fraction: 2.5 Gy
Plan Total Fractions Prescribed: 28
Plan Total Prescribed Dose: 70 Gy
Reference Point Dosage Given to Date: 35 Gy
Reference Point Session Dosage Given: 2.5 Gy
Session Number: 14

## 2024-03-03 ENCOUNTER — Ambulatory Visit
Admission: RE | Admit: 2024-03-03 | Discharge: 2024-03-03 | Disposition: A | Discharge status: Home / Self Care | Source: Ambulatory Visit | Attending: Radiation Oncology

## 2024-03-03 ENCOUNTER — Other Ambulatory Visit: Payer: Self-pay

## 2024-03-03 DIAGNOSIS — Z191 Hormone sensitive malignancy status: Secondary | ICD-10-CM | POA: Diagnosis not present

## 2024-03-03 DIAGNOSIS — C61 Malignant neoplasm of prostate: Secondary | ICD-10-CM | POA: Diagnosis not present

## 2024-03-03 DIAGNOSIS — Z510 Encounter for antineoplastic radiation therapy: Secondary | ICD-10-CM | POA: Diagnosis not present

## 2024-03-03 LAB — RAD ONC ARIA SESSION SUMMARY
Course Elapsed Days: 22
Plan Fractions Treated to Date: 15
Plan Prescribed Dose Per Fraction: 2.5 Gy
Plan Total Fractions Prescribed: 28
Plan Total Prescribed Dose: 70 Gy
Reference Point Dosage Given to Date: 37.5 Gy
Reference Point Session Dosage Given: 2.5 Gy
Session Number: 15

## 2024-03-04 ENCOUNTER — Other Ambulatory Visit: Payer: Self-pay

## 2024-03-04 ENCOUNTER — Ambulatory Visit
Admission: RE | Admit: 2024-03-04 | Discharge: 2024-03-04 | Disposition: A | Discharge status: Home / Self Care | Source: Ambulatory Visit | Attending: Radiation Oncology | Admitting: Radiation Oncology

## 2024-03-04 DIAGNOSIS — Z191 Hormone sensitive malignancy status: Secondary | ICD-10-CM | POA: Diagnosis not present

## 2024-03-04 DIAGNOSIS — Z510 Encounter for antineoplastic radiation therapy: Secondary | ICD-10-CM | POA: Diagnosis not present

## 2024-03-04 DIAGNOSIS — C61 Malignant neoplasm of prostate: Secondary | ICD-10-CM | POA: Diagnosis not present

## 2024-03-04 LAB — RAD ONC ARIA SESSION SUMMARY
Course Elapsed Days: 23
Plan Fractions Treated to Date: 16
Plan Prescribed Dose Per Fraction: 2.5 Gy
Plan Total Fractions Prescribed: 28
Plan Total Prescribed Dose: 70 Gy
Reference Point Dosage Given to Date: 40 Gy
Reference Point Session Dosage Given: 2.5 Gy
Session Number: 16

## 2024-03-05 ENCOUNTER — Other Ambulatory Visit: Payer: Self-pay

## 2024-03-05 ENCOUNTER — Ambulatory Visit
Admission: RE | Admit: 2024-03-05 | Discharge: 2024-03-05 | Disposition: A | Discharge status: Home / Self Care | Source: Ambulatory Visit | Attending: Radiation Oncology | Admitting: Radiation Oncology

## 2024-03-05 ENCOUNTER — Ambulatory Visit
Admission: RE | Admit: 2024-03-05 | Discharge: 2024-03-05 | Discharge status: Home / Self Care | Source: Ambulatory Visit | Attending: Radiation Oncology

## 2024-03-05 DIAGNOSIS — Z510 Encounter for antineoplastic radiation therapy: Secondary | ICD-10-CM | POA: Diagnosis not present

## 2024-03-05 DIAGNOSIS — Z191 Hormone sensitive malignancy status: Secondary | ICD-10-CM | POA: Diagnosis not present

## 2024-03-05 DIAGNOSIS — C61 Malignant neoplasm of prostate: Secondary | ICD-10-CM | POA: Diagnosis not present

## 2024-03-05 LAB — RAD ONC ARIA SESSION SUMMARY
Course Elapsed Days: 24
Plan Fractions Treated to Date: 17
Plan Prescribed Dose Per Fraction: 2.5 Gy
Plan Total Fractions Prescribed: 28
Plan Total Prescribed Dose: 70 Gy
Reference Point Dosage Given to Date: 42.5 Gy
Reference Point Session Dosage Given: 2.5 Gy
Session Number: 17

## 2024-03-06 ENCOUNTER — Ambulatory Visit
Admission: RE | Admit: 2024-03-06 | Discharge: 2024-03-06 | Disposition: A | Discharge status: Home / Self Care | Source: Ambulatory Visit | Attending: Radiation Oncology | Admitting: Radiation Oncology

## 2024-03-06 ENCOUNTER — Other Ambulatory Visit: Payer: Self-pay

## 2024-03-06 DIAGNOSIS — Z510 Encounter for antineoplastic radiation therapy: Secondary | ICD-10-CM | POA: Diagnosis not present

## 2024-03-06 DIAGNOSIS — Z191 Hormone sensitive malignancy status: Secondary | ICD-10-CM | POA: Diagnosis not present

## 2024-03-06 DIAGNOSIS — C61 Malignant neoplasm of prostate: Secondary | ICD-10-CM | POA: Diagnosis not present

## 2024-03-06 LAB — RAD ONC ARIA SESSION SUMMARY
Course Elapsed Days: 25
Plan Fractions Treated to Date: 18
Plan Prescribed Dose Per Fraction: 2.5 Gy
Plan Total Fractions Prescribed: 28
Plan Total Prescribed Dose: 70 Gy
Reference Point Dosage Given to Date: 45 Gy
Reference Point Session Dosage Given: 2.5 Gy
Session Number: 18

## 2024-03-09 ENCOUNTER — Ambulatory Visit
Admission: RE | Admit: 2024-03-09 | Discharge: 2024-03-09 | Disposition: A | Discharge status: Home / Self Care | Source: Ambulatory Visit | Attending: Radiation Oncology

## 2024-03-09 ENCOUNTER — Other Ambulatory Visit: Payer: Self-pay

## 2024-03-09 DIAGNOSIS — Z191 Hormone sensitive malignancy status: Secondary | ICD-10-CM | POA: Diagnosis not present

## 2024-03-09 DIAGNOSIS — C61 Malignant neoplasm of prostate: Secondary | ICD-10-CM | POA: Diagnosis not present

## 2024-03-09 DIAGNOSIS — Z510 Encounter for antineoplastic radiation therapy: Secondary | ICD-10-CM | POA: Diagnosis not present

## 2024-03-09 LAB — RAD ONC ARIA SESSION SUMMARY
Course Elapsed Days: 28
Plan Fractions Treated to Date: 19
Plan Prescribed Dose Per Fraction: 2.5 Gy
Plan Total Fractions Prescribed: 28
Plan Total Prescribed Dose: 70 Gy
Reference Point Dosage Given to Date: 47.5 Gy
Reference Point Session Dosage Given: 2.5 Gy
Session Number: 19

## 2024-03-10 ENCOUNTER — Ambulatory Visit
Admission: RE | Admit: 2024-03-10 | Discharge: 2024-03-10 | Disposition: A | Discharge status: Home / Self Care | Source: Ambulatory Visit | Attending: Radiation Oncology

## 2024-03-10 ENCOUNTER — Other Ambulatory Visit: Payer: Self-pay

## 2024-03-10 DIAGNOSIS — C61 Malignant neoplasm of prostate: Secondary | ICD-10-CM | POA: Diagnosis not present

## 2024-03-10 DIAGNOSIS — Z510 Encounter for antineoplastic radiation therapy: Secondary | ICD-10-CM | POA: Diagnosis not present

## 2024-03-10 DIAGNOSIS — Z191 Hormone sensitive malignancy status: Secondary | ICD-10-CM | POA: Diagnosis not present

## 2024-03-10 LAB — RAD ONC ARIA SESSION SUMMARY
Course Elapsed Days: 29
Plan Fractions Treated to Date: 20
Plan Prescribed Dose Per Fraction: 2.5 Gy
Plan Total Fractions Prescribed: 28
Plan Total Prescribed Dose: 70 Gy
Reference Point Dosage Given to Date: 50 Gy
Reference Point Session Dosage Given: 2.5 Gy
Session Number: 20

## 2024-03-11 ENCOUNTER — Ambulatory Visit
Admission: RE | Admit: 2024-03-11 | Discharge: 2024-03-11 | Disposition: A | Discharge status: Home / Self Care | Source: Ambulatory Visit | Attending: Radiation Oncology | Admitting: Radiation Oncology

## 2024-03-11 ENCOUNTER — Other Ambulatory Visit: Payer: Self-pay

## 2024-03-11 DIAGNOSIS — Z510 Encounter for antineoplastic radiation therapy: Secondary | ICD-10-CM | POA: Diagnosis not present

## 2024-03-11 DIAGNOSIS — C61 Malignant neoplasm of prostate: Secondary | ICD-10-CM | POA: Diagnosis not present

## 2024-03-11 DIAGNOSIS — Z191 Hormone sensitive malignancy status: Secondary | ICD-10-CM | POA: Diagnosis not present

## 2024-03-11 LAB — RAD ONC ARIA SESSION SUMMARY
Course Elapsed Days: 30
Plan Fractions Treated to Date: 21
Plan Prescribed Dose Per Fraction: 2.5 Gy
Plan Total Fractions Prescribed: 28
Plan Total Prescribed Dose: 70 Gy
Reference Point Dosage Given to Date: 52.5 Gy
Reference Point Session Dosage Given: 2.5 Gy
Session Number: 21

## 2024-03-12 ENCOUNTER — Other Ambulatory Visit: Payer: Self-pay

## 2024-03-12 ENCOUNTER — Other Ambulatory Visit (HOSPITAL_COMMUNITY): Payer: Self-pay

## 2024-03-12 ENCOUNTER — Ambulatory Visit
Admission: RE | Admit: 2024-03-12 | Discharge: 2024-03-12 | Disposition: A | Discharge status: Home / Self Care | Source: Ambulatory Visit | Attending: Radiation Oncology | Admitting: Radiation Oncology

## 2024-03-12 DIAGNOSIS — Z510 Encounter for antineoplastic radiation therapy: Secondary | ICD-10-CM | POA: Diagnosis not present

## 2024-03-12 DIAGNOSIS — C61 Malignant neoplasm of prostate: Secondary | ICD-10-CM | POA: Diagnosis not present

## 2024-03-12 DIAGNOSIS — Z191 Hormone sensitive malignancy status: Secondary | ICD-10-CM | POA: Diagnosis not present

## 2024-03-12 LAB — RAD ONC ARIA SESSION SUMMARY
Course Elapsed Days: 31
Plan Fractions Treated to Date: 22
Plan Prescribed Dose Per Fraction: 2.5 Gy
Plan Total Fractions Prescribed: 28
Plan Total Prescribed Dose: 70 Gy
Reference Point Dosage Given to Date: 55 Gy
Reference Point Session Dosage Given: 2.5 Gy
Session Number: 22

## 2024-03-13 ENCOUNTER — Other Ambulatory Visit: Payer: Self-pay

## 2024-03-13 ENCOUNTER — Ambulatory Visit
Admission: RE | Admit: 2024-03-13 | Discharge: 2024-03-13 | Disposition: A | Discharge status: Home / Self Care | Source: Ambulatory Visit | Attending: Radiation Oncology | Admitting: Radiation Oncology

## 2024-03-13 DIAGNOSIS — Z191 Hormone sensitive malignancy status: Secondary | ICD-10-CM | POA: Diagnosis not present

## 2024-03-13 DIAGNOSIS — C61 Malignant neoplasm of prostate: Secondary | ICD-10-CM | POA: Diagnosis not present

## 2024-03-13 DIAGNOSIS — Z510 Encounter for antineoplastic radiation therapy: Secondary | ICD-10-CM | POA: Diagnosis not present

## 2024-03-13 LAB — RAD ONC ARIA SESSION SUMMARY
Course Elapsed Days: 32
Plan Fractions Treated to Date: 23
Plan Prescribed Dose Per Fraction: 2.5 Gy
Plan Total Fractions Prescribed: 28
Plan Total Prescribed Dose: 70 Gy
Reference Point Dosage Given to Date: 57.5 Gy
Reference Point Session Dosage Given: 2.5 Gy
Session Number: 23

## 2024-03-16 ENCOUNTER — Ambulatory Visit
Admission: RE | Admit: 2024-03-16 | Discharge: 2024-03-16 | Disposition: A | Discharge status: Home / Self Care | Source: Ambulatory Visit | Attending: Radiation Oncology

## 2024-03-16 ENCOUNTER — Other Ambulatory Visit: Payer: Self-pay

## 2024-03-16 DIAGNOSIS — Z510 Encounter for antineoplastic radiation therapy: Secondary | ICD-10-CM | POA: Diagnosis not present

## 2024-03-16 DIAGNOSIS — J301 Allergic rhinitis due to pollen: Secondary | ICD-10-CM | POA: Diagnosis not present

## 2024-03-16 DIAGNOSIS — J3081 Allergic rhinitis due to animal (cat) (dog) hair and dander: Secondary | ICD-10-CM | POA: Diagnosis not present

## 2024-03-16 DIAGNOSIS — J3089 Other allergic rhinitis: Secondary | ICD-10-CM | POA: Diagnosis not present

## 2024-03-16 DIAGNOSIS — Z191 Hormone sensitive malignancy status: Secondary | ICD-10-CM | POA: Diagnosis not present

## 2024-03-16 DIAGNOSIS — C61 Malignant neoplasm of prostate: Secondary | ICD-10-CM | POA: Diagnosis not present

## 2024-03-16 LAB — RAD ONC ARIA SESSION SUMMARY
Course Elapsed Days: 35
Plan Fractions Treated to Date: 24
Plan Prescribed Dose Per Fraction: 2.5 Gy
Plan Total Fractions Prescribed: 28
Plan Total Prescribed Dose: 70 Gy
Reference Point Dosage Given to Date: 60 Gy
Reference Point Session Dosage Given: 2.5 Gy
Session Number: 24

## 2024-03-17 ENCOUNTER — Ambulatory Visit
Admission: RE | Admit: 2024-03-17 | Discharge: 2024-03-17 | Disposition: A | Discharge status: Home / Self Care | Source: Ambulatory Visit | Attending: Radiation Oncology

## 2024-03-17 ENCOUNTER — Other Ambulatory Visit: Payer: Self-pay

## 2024-03-17 ENCOUNTER — Telehealth: Payer: Self-pay | Admitting: *Deleted

## 2024-03-17 DIAGNOSIS — C61 Malignant neoplasm of prostate: Secondary | ICD-10-CM | POA: Diagnosis not present

## 2024-03-17 DIAGNOSIS — Z510 Encounter for antineoplastic radiation therapy: Secondary | ICD-10-CM | POA: Diagnosis not present

## 2024-03-17 DIAGNOSIS — Z191 Hormone sensitive malignancy status: Secondary | ICD-10-CM | POA: Diagnosis not present

## 2024-03-17 LAB — RAD ONC ARIA SESSION SUMMARY
Course Elapsed Days: 36
Plan Fractions Treated to Date: 25
Plan Prescribed Dose Per Fraction: 2.5 Gy
Plan Total Fractions Prescribed: 28
Plan Total Prescribed Dose: 70 Gy
Reference Point Dosage Given to Date: 62.5 Gy
Reference Point Session Dosage Given: 2.5 Gy
Session Number: 25

## 2024-03-17 NOTE — Telephone Encounter (Signed)
 Completed [employer] form [id number]  paperwork   Sent to provider to review, amend, sign and return to this nurse to return to claims benefit Production designer, theatre/television/film.

## 2024-03-18 ENCOUNTER — Ambulatory Visit
Admission: RE | Admit: 2024-03-18 | Discharge: 2024-03-18 | Disposition: A | Discharge status: Home / Self Care | Source: Ambulatory Visit | Attending: Radiation Oncology | Admitting: Radiation Oncology

## 2024-03-18 ENCOUNTER — Ambulatory Visit

## 2024-03-18 ENCOUNTER — Other Ambulatory Visit: Payer: Self-pay

## 2024-03-18 DIAGNOSIS — Z510 Encounter for antineoplastic radiation therapy: Secondary | ICD-10-CM | POA: Diagnosis not present

## 2024-03-18 DIAGNOSIS — C61 Malignant neoplasm of prostate: Secondary | ICD-10-CM | POA: Diagnosis not present

## 2024-03-18 DIAGNOSIS — Z191 Hormone sensitive malignancy status: Secondary | ICD-10-CM | POA: Diagnosis not present

## 2024-03-18 LAB — RAD ONC ARIA SESSION SUMMARY
Course Elapsed Days: 37
Plan Fractions Treated to Date: 26
Plan Prescribed Dose Per Fraction: 2.5 Gy
Plan Total Fractions Prescribed: 28
Plan Total Prescribed Dose: 70 Gy
Reference Point Dosage Given to Date: 65 Gy
Reference Point Session Dosage Given: 2.5 Gy
Session Number: 26

## 2024-03-19 ENCOUNTER — Other Ambulatory Visit: Payer: Self-pay

## 2024-03-19 ENCOUNTER — Ambulatory Visit
Admission: RE | Admit: 2024-03-19 | Discharge: 2024-03-19 | Disposition: A | Discharge status: Home / Self Care | Source: Ambulatory Visit | Attending: Radiation Oncology | Admitting: Radiation Oncology

## 2024-03-19 ENCOUNTER — Ambulatory Visit

## 2024-03-19 ENCOUNTER — Other Ambulatory Visit (HOSPITAL_COMMUNITY): Payer: Self-pay

## 2024-03-19 DIAGNOSIS — C61 Malignant neoplasm of prostate: Secondary | ICD-10-CM | POA: Diagnosis not present

## 2024-03-19 DIAGNOSIS — Z191 Hormone sensitive malignancy status: Secondary | ICD-10-CM | POA: Diagnosis not present

## 2024-03-19 DIAGNOSIS — Z510 Encounter for antineoplastic radiation therapy: Secondary | ICD-10-CM | POA: Diagnosis not present

## 2024-03-19 LAB — RAD ONC ARIA SESSION SUMMARY
Course Elapsed Days: 38
Plan Fractions Treated to Date: 27
Plan Prescribed Dose Per Fraction: 2.5 Gy
Plan Total Fractions Prescribed: 28
Plan Total Prescribed Dose: 70 Gy
Reference Point Dosage Given to Date: 67.5 Gy
Reference Point Session Dosage Given: 2.5 Gy
Session Number: 27

## 2024-03-20 ENCOUNTER — Ambulatory Visit
Admission: RE | Admit: 2024-03-20 | Discharge: 2024-03-20 | Disposition: A | Discharge status: Home / Self Care | Source: Ambulatory Visit | Attending: Radiation Oncology | Admitting: Radiation Oncology

## 2024-03-20 ENCOUNTER — Other Ambulatory Visit (HOSPITAL_COMMUNITY): Payer: Self-pay

## 2024-03-20 ENCOUNTER — Other Ambulatory Visit: Payer: Self-pay

## 2024-03-20 DIAGNOSIS — C61 Malignant neoplasm of prostate: Secondary | ICD-10-CM | POA: Diagnosis not present

## 2024-03-20 DIAGNOSIS — Z510 Encounter for antineoplastic radiation therapy: Secondary | ICD-10-CM | POA: Diagnosis not present

## 2024-03-20 DIAGNOSIS — Z191 Hormone sensitive malignancy status: Secondary | ICD-10-CM | POA: Diagnosis not present

## 2024-03-20 LAB — RAD ONC ARIA SESSION SUMMARY
Course Elapsed Days: 39
Plan Fractions Treated to Date: 28
Plan Prescribed Dose Per Fraction: 2.5 Gy
Plan Total Fractions Prescribed: 28
Plan Total Prescribed Dose: 70 Gy
Reference Point Dosage Given to Date: 70 Gy
Reference Point Session Dosage Given: 2.5 Gy
Session Number: 28

## 2024-03-20 MED ORDER — AMLODIPINE BESYLATE-VALSARTAN 10-320 MG PO TABS
1.0000 | ORAL_TABLET | Freq: Every day | ORAL | 2 refills | Status: AC
  Filled 2024-03-20: qty 90, 90d supply, fill #0
  Filled 2024-07-26: qty 90, 90d supply, fill #1

## 2024-03-23 DIAGNOSIS — J3089 Other allergic rhinitis: Secondary | ICD-10-CM | POA: Diagnosis not present

## 2024-03-23 DIAGNOSIS — J3081 Allergic rhinitis due to animal (cat) (dog) hair and dander: Secondary | ICD-10-CM | POA: Diagnosis not present

## 2024-03-23 DIAGNOSIS — J301 Allergic rhinitis due to pollen: Secondary | ICD-10-CM | POA: Diagnosis not present

## 2024-03-23 NOTE — Radiation Completion Notes (Addendum)
  Radiation Oncology         [phone] ________________________________  Name: Nicholas Mcintosh MRN: [id number]  Date: 03/20/2024  DOB: October 18, 1965  Referring Physician: Lonni Han, M.D. Date of Service: 2024-03-23 Radiation Oncologist: Adina Barge, M.D. [facility]     RADIATION ONCOLOGY END OF TREATMENT NOTE     Diagnosis: 58 y.o. gentleman with Stage T1c adenocarcinoma of the prostate with Gleason score of 3+4, and PSA of 3.08.   Intent: Curative     ==========DELIVERED PLANS==========  First Treatment Date: 2024-02-10 Last Treatment Date: 2024-03-20   Plan Name: Prostate Site: Prostate Technique: IMRT Mode: Photon Dose Per Fraction: 2.5 Gy Prescribed Dose (Delivered / Prescribed): 70 Gy / 70 Gy Prescribed Fxs (Delivered / Prescribed): 28 / 28     ==========ON TREATMENT VISIT DATES========== 2024-02-13, 2024-02-21, 2024-03-02, 2024-03-05, 2024-03-13, 2024-03-20    See weekly On Treatment Notes in Epic for details in the Media tab (listed as Progress notes on the On Treatment Visit Dates listed above).  He tolerated the daily radiation treatments relatively well with some increased LUTS and modest fatigue.  The patient will receive a call in about one month from the radiation oncology department. He will continue follow up with his urologist, Dr. Han, as well.  ------------------------------------------------   Donnice Barge, MD [facility]  Radiation Oncology Direct Dial: [phone]  Fax: [phone] [email or web address]  Skype  LinkedIn

## 2024-03-27 NOTE — Progress Notes (Signed)
 Patient was a RadOnc Consult on 09/23/2023 for his stage T1c adenocarcinoma of the prostate with Gleason Score of 3+4, and PSA of 3.08.  Patient proceed with treatment recommendations of 5.5 weeks daily radiation and had his final radiation treatment on 03/20/24.   Patient is scheduled for a post treatment nurse call on 04/21/24 and has his first post treatment PSA on 9/15 at [facility].    RN spoke with patient and provided education on post treatment PSA monitoring.  All questions answered, no additional needs at this time.

## 2024-03-31 ENCOUNTER — Other Ambulatory Visit (HOSPITAL_COMMUNITY): Payer: Self-pay

## 2024-03-31 MED ORDER — OXYCODONE-ACETAMINOPHEN 5-325 MG PO TABS
1.0000 | ORAL_TABLET | Freq: Three times a day (TID) | ORAL | 0 refills | Status: AC
  Filled 2024-03-31: qty 90, 30d supply, fill #0

## 2024-04-01 DIAGNOSIS — J301 Allergic rhinitis due to pollen: Secondary | ICD-10-CM | POA: Diagnosis not present

## 2024-04-01 DIAGNOSIS — J3089 Other allergic rhinitis: Secondary | ICD-10-CM | POA: Diagnosis not present

## 2024-04-02 DIAGNOSIS — G4733 Obstructive sleep apnea (adult) (pediatric): Secondary | ICD-10-CM | POA: Diagnosis not present

## 2024-04-06 DIAGNOSIS — J301 Allergic rhinitis due to pollen: Secondary | ICD-10-CM | POA: Diagnosis not present

## 2024-04-06 DIAGNOSIS — J3081 Allergic rhinitis due to animal (cat) (dog) hair and dander: Secondary | ICD-10-CM | POA: Diagnosis not present

## 2024-04-06 DIAGNOSIS — J3089 Other allergic rhinitis: Secondary | ICD-10-CM | POA: Diagnosis not present

## 2024-04-13 DIAGNOSIS — J3089 Other allergic rhinitis: Secondary | ICD-10-CM | POA: Diagnosis not present

## 2024-04-13 DIAGNOSIS — C61 Malignant neoplasm of prostate: Secondary | ICD-10-CM | POA: Diagnosis not present

## 2024-04-13 DIAGNOSIS — J301 Allergic rhinitis due to pollen: Secondary | ICD-10-CM | POA: Diagnosis not present

## 2024-04-13 DIAGNOSIS — J3081 Allergic rhinitis due to animal (cat) (dog) hair and dander: Secondary | ICD-10-CM | POA: Diagnosis not present

## 2024-04-20 DIAGNOSIS — J301 Allergic rhinitis due to pollen: Secondary | ICD-10-CM | POA: Diagnosis not present

## 2024-04-20 DIAGNOSIS — R3912 Poor urinary stream: Secondary | ICD-10-CM | POA: Diagnosis not present

## 2024-04-20 DIAGNOSIS — J3081 Allergic rhinitis due to animal (cat) (dog) hair and dander: Secondary | ICD-10-CM | POA: Diagnosis not present

## 2024-04-20 DIAGNOSIS — J3089 Other allergic rhinitis: Secondary | ICD-10-CM | POA: Diagnosis not present

## 2024-04-20 DIAGNOSIS — C61 Malignant neoplasm of prostate: Secondary | ICD-10-CM | POA: Diagnosis not present

## 2024-04-20 DIAGNOSIS — N401 Enlarged prostate with lower urinary tract symptoms: Secondary | ICD-10-CM | POA: Diagnosis not present

## 2024-04-20 DIAGNOSIS — N200 Calculus of kidney: Secondary | ICD-10-CM | POA: Diagnosis not present

## 2024-04-20 DIAGNOSIS — R972 Elevated prostate specific antigen [PSA]: Secondary | ICD-10-CM | POA: Diagnosis not present

## 2024-04-21 ENCOUNTER — Ambulatory Visit
Admission: RE | Admit: 2024-04-21 | Discharge: 2024-04-21 | Disposition: A | Discharge status: Home / Self Care | Source: Ambulatory Visit | Attending: Radiation Oncology | Admitting: Radiation Oncology

## 2024-04-21 DIAGNOSIS — C61 Malignant neoplasm of prostate: Secondary | ICD-10-CM

## 2024-04-21 NOTE — Progress Notes (Signed)
  Radiation Oncology         [phone] ________________________________  Name: Nicholas Mcintosh MRN: [id number]  Date of Service: 04/21/2024  DOB: 1966-06-30  Post Treatment Telephone Note  Diagnosis:  58 y.o. gentleman with Stage T1c adenocarcinoma of the prostate with Gleason score of 3+4, and PSA of 3.08.   Pre Treatment IPSS Score: 17  The patient was available for call today.   Symptoms of fatigue have improved since completing therapy.  Symptoms of bladder changes have improved since completing therapy. Current symptoms include urinary frequency & mild dysuria, and medications for bladder symptoms include Rapaflo , Doxazosin  & AZO.  Symptoms of bowel changes have improved since completing therapy. No current symptoms reported.   Post Treatment IPSS Score:  7  IPSS Questionnaire (AUA-7): Over the past month.   1)  How often have you had a sensation of not emptying your bladder completely after you finish urinating?  2 - Less than half the time  2)  How often have you had to urinate again less than two hours after you finished urinating? 0 - Not at all  3)  How often have you found you stopped and started again several times when you urinated?  0 - Not at all  4) How difficult have you found it to postpone urination?  0 - Not at all  5) How often have you had a weak urinary stream?  2 - Less than half the time  6) How often have you had to push or strain to begin urination?  0 - Not at all  7) How many times did you most typically get up to urinate from the time you went to bed until the time you got up in the morning?  3 - 3 times  Total score:  7. Which indicates mild symptoms  0-7 mildly symptomatic   8-19 moderately symptomatic   20-35 severely symptomatic   Patient has a scheduled follow up visit with his urologist, Dr. Lonni Han, on 04/20/2024 for PSA testing reports that the number had gone up to a 4.  Reports in speaking with the doctor it may gone down it's  that his body is still healing.  He is scheduled to go back to Dr. Lonni Pang office in 3 months.  He was encouraged to call back with concerns or questions regarding radiation.

## 2024-04-22 ENCOUNTER — Telehealth: Payer: Self-pay

## 2024-04-22 NOTE — Telephone Encounter (Signed)
 RN received call from Nicholas Mcintosh requesting a return to work note.  Note completed and will be mailed out.

## 2024-04-27 ENCOUNTER — Other Ambulatory Visit: Payer: Self-pay | Admitting: Urology

## 2024-04-27 DIAGNOSIS — C61 Malignant neoplasm of prostate: Secondary | ICD-10-CM

## 2024-04-30 ENCOUNTER — Other Ambulatory Visit (HOSPITAL_COMMUNITY): Payer: Self-pay

## 2024-04-30 MED ORDER — OXYCODONE-ACETAMINOPHEN 5-325 MG PO TABS
1.0000 | ORAL_TABLET | Freq: Three times a day (TID) | ORAL | 0 refills | Status: AC | PRN
  Filled 2024-04-30: qty 90, 30d supply, fill #0

## 2024-05-01 ENCOUNTER — Other Ambulatory Visit (HOSPITAL_COMMUNITY): Payer: Self-pay

## 2024-05-19 DIAGNOSIS — J301 Allergic rhinitis due to pollen: Secondary | ICD-10-CM | POA: Diagnosis not present

## 2024-05-19 DIAGNOSIS — J3081 Allergic rhinitis due to animal (cat) (dog) hair and dander: Secondary | ICD-10-CM | POA: Diagnosis not present

## 2024-05-19 DIAGNOSIS — J3089 Other allergic rhinitis: Secondary | ICD-10-CM | POA: Diagnosis not present

## 2024-06-01 ENCOUNTER — Other Ambulatory Visit (HOSPITAL_COMMUNITY): Payer: Self-pay

## 2024-06-01 DIAGNOSIS — J3081 Allergic rhinitis due to animal (cat) (dog) hair and dander: Secondary | ICD-10-CM | POA: Diagnosis not present

## 2024-06-01 DIAGNOSIS — J3089 Other allergic rhinitis: Secondary | ICD-10-CM | POA: Diagnosis not present

## 2024-06-01 DIAGNOSIS — J301 Allergic rhinitis due to pollen: Secondary | ICD-10-CM | POA: Diagnosis not present

## 2024-06-01 MED ORDER — OXYCODONE-ACETAMINOPHEN 5-325 MG PO TABS
1.0000 | ORAL_TABLET | Freq: Three times a day (TID) | ORAL | 0 refills | Status: DC
  Filled 2024-06-01: qty 90, 30d supply, fill #0

## 2024-06-01 MED ORDER — MONTELUKAST SODIUM 10 MG PO TABS
10.0000 mg | ORAL_TABLET | Freq: Every day | ORAL | 3 refills | Status: AC
  Filled 2024-06-01: qty 90, 90d supply, fill #0

## 2024-06-18 DIAGNOSIS — J3081 Allergic rhinitis due to animal (cat) (dog) hair and dander: Secondary | ICD-10-CM | POA: Diagnosis not present

## 2024-06-18 DIAGNOSIS — J301 Allergic rhinitis due to pollen: Secondary | ICD-10-CM | POA: Diagnosis not present

## 2024-06-18 DIAGNOSIS — J3089 Other allergic rhinitis: Secondary | ICD-10-CM | POA: Diagnosis not present

## 2024-06-24 ENCOUNTER — Other Ambulatory Visit (HOSPITAL_COMMUNITY): Payer: Self-pay

## 2024-06-26 ENCOUNTER — Other Ambulatory Visit: Payer: Self-pay

## 2024-06-26 ENCOUNTER — Other Ambulatory Visit (HOSPITAL_COMMUNITY): Payer: Self-pay

## 2024-06-30 ENCOUNTER — Other Ambulatory Visit (HOSPITAL_COMMUNITY): Payer: Self-pay

## 2024-06-30 DIAGNOSIS — I10 Essential (primary) hypertension: Secondary | ICD-10-CM | POA: Diagnosis not present

## 2024-06-30 DIAGNOSIS — Z0000 Encounter for general adult medical examination without abnormal findings: Secondary | ICD-10-CM | POA: Diagnosis not present

## 2024-06-30 DIAGNOSIS — G8929 Other chronic pain: Secondary | ICD-10-CM | POA: Diagnosis not present

## 2024-06-30 DIAGNOSIS — J449 Chronic obstructive pulmonary disease, unspecified: Secondary | ICD-10-CM | POA: Diagnosis not present

## 2024-06-30 DIAGNOSIS — Z6837 Body mass index (BMI) 37.0-37.9, adult: Secondary | ICD-10-CM | POA: Diagnosis not present

## 2024-06-30 DIAGNOSIS — G4733 Obstructive sleep apnea (adult) (pediatric): Secondary | ICD-10-CM | POA: Diagnosis not present

## 2024-06-30 DIAGNOSIS — C61 Malignant neoplasm of prostate: Secondary | ICD-10-CM | POA: Diagnosis not present

## 2024-06-30 DIAGNOSIS — E039 Hypothyroidism, unspecified: Secondary | ICD-10-CM | POA: Diagnosis not present

## 2024-06-30 DIAGNOSIS — F324 Major depressive disorder, single episode, in partial remission: Secondary | ICD-10-CM | POA: Diagnosis not present

## 2024-06-30 MED ORDER — OXYCODONE-ACETAMINOPHEN 5-325 MG PO TABS
1.0000 | ORAL_TABLET | Freq: Three times a day (TID) | ORAL | 0 refills | Status: AC
  Filled 2024-06-30: qty 90, 30d supply, fill #0

## 2024-07-02 DIAGNOSIS — G4733 Obstructive sleep apnea (adult) (pediatric): Secondary | ICD-10-CM | POA: Diagnosis not present

## 2024-07-03 ENCOUNTER — Other Ambulatory Visit (HOSPITAL_COMMUNITY): Payer: Self-pay

## 2024-07-03 ENCOUNTER — Other Ambulatory Visit: Payer: Self-pay

## 2024-07-03 DIAGNOSIS — J3089 Other allergic rhinitis: Secondary | ICD-10-CM | POA: Diagnosis not present

## 2024-07-03 DIAGNOSIS — J301 Allergic rhinitis due to pollen: Secondary | ICD-10-CM | POA: Diagnosis not present

## 2024-07-03 DIAGNOSIS — J3081 Allergic rhinitis due to animal (cat) (dog) hair and dander: Secondary | ICD-10-CM | POA: Diagnosis not present

## 2024-07-06 ENCOUNTER — Other Ambulatory Visit (HOSPITAL_COMMUNITY): Payer: Self-pay

## 2024-07-06 MED ORDER — TRELEGY ELLIPTA 200-62.5-25 MCG/ACT IN AEPB
1.0000 | INHALATION_SPRAY | Freq: Every day | RESPIRATORY_TRACT | 0 refills | Status: AC
  Filled 2024-07-06: qty 60, 30d supply, fill #0

## 2024-07-07 ENCOUNTER — Other Ambulatory Visit (HOSPITAL_COMMUNITY): Payer: Self-pay

## 2024-07-08 ENCOUNTER — Other Ambulatory Visit (HOSPITAL_COMMUNITY): Payer: Self-pay

## 2024-07-08 DIAGNOSIS — G4733 Obstructive sleep apnea (adult) (pediatric): Secondary | ICD-10-CM | POA: Diagnosis not present

## 2024-07-14 ENCOUNTER — Other Ambulatory Visit (HOSPITAL_COMMUNITY): Payer: Self-pay

## 2024-07-14 DIAGNOSIS — J301 Allergic rhinitis due to pollen: Secondary | ICD-10-CM | POA: Diagnosis not present

## 2024-07-14 DIAGNOSIS — J3081 Allergic rhinitis due to animal (cat) (dog) hair and dander: Secondary | ICD-10-CM | POA: Diagnosis not present

## 2024-07-14 DIAGNOSIS — J3089 Other allergic rhinitis: Secondary | ICD-10-CM | POA: Diagnosis not present

## 2024-07-14 DIAGNOSIS — K219 Gastro-esophageal reflux disease without esophagitis: Secondary | ICD-10-CM | POA: Diagnosis not present

## 2024-07-14 DIAGNOSIS — J4540 Moderate persistent asthma, uncomplicated: Secondary | ICD-10-CM | POA: Diagnosis not present

## 2024-07-14 DIAGNOSIS — Z79899 Other long term (current) drug therapy: Secondary | ICD-10-CM | POA: Diagnosis not present

## 2024-07-14 MED ORDER — TRELEGY ELLIPTA 200-62.5-25 MCG/ACT IN AEPB
1.0000 | INHALATION_SPRAY | Freq: Every day | RESPIRATORY_TRACT | 3 refills | Status: AC
  Filled 2024-07-14: qty 60, 30d supply, fill #0
  Filled 2024-07-21: qty 180, 90d supply, fill #0

## 2024-07-14 MED ORDER — ALBUTEROL SULFATE HFA 108 (90 BASE) MCG/ACT IN AERS
2.0000 | INHALATION_SPRAY | RESPIRATORY_TRACT | 3 refills | Status: AC | PRN
  Filled 2024-07-14: qty 6.7, 20d supply, fill #0
  Filled 2024-07-21: qty 20.1, 90d supply, fill #0

## 2024-07-14 MED ORDER — MONTELUKAST SODIUM 10 MG PO TABS
10.0000 mg | ORAL_TABLET | Freq: Every day | ORAL | 3 refills | Status: AC
  Filled 2024-07-14: qty 90, 90d supply, fill #0

## 2024-07-14 MED ORDER — AZELASTINE HCL 137 MCG/SPRAY NA SOLN
2.0000 | Freq: Two times a day (BID) | NASAL | 3 refills | Status: AC
  Filled 2024-07-14 – 2024-07-21 (×2): qty 30, 30d supply, fill #0

## 2024-07-16 ENCOUNTER — Other Ambulatory Visit (HOSPITAL_COMMUNITY): Payer: Self-pay

## 2024-07-21 ENCOUNTER — Other Ambulatory Visit: Payer: Self-pay

## 2024-07-21 ENCOUNTER — Encounter: Payer: Self-pay | Admitting: *Deleted

## 2024-07-21 ENCOUNTER — Other Ambulatory Visit (HOSPITAL_COMMUNITY): Payer: Self-pay

## 2024-07-21 DIAGNOSIS — C61 Malignant neoplasm of prostate: Secondary | ICD-10-CM | POA: Diagnosis not present

## 2024-07-22 ENCOUNTER — Other Ambulatory Visit (HOSPITAL_COMMUNITY): Payer: Self-pay

## 2024-07-22 MED ORDER — LIDOCAINE VISCOUS HCL 2 % MT SOLN
10.0000 mL | Freq: Three times a day (TID) | OROMUCOSAL | 0 refills | Status: AC
  Filled 2024-07-22 – 2024-07-26 (×2): qty 240, 8d supply, fill #0

## 2024-07-24 ENCOUNTER — Encounter: Payer: Self-pay | Admitting: *Deleted

## 2024-07-24 ENCOUNTER — Other Ambulatory Visit (HOSPITAL_COMMUNITY): Payer: Self-pay

## 2024-07-26 ENCOUNTER — Other Ambulatory Visit (HOSPITAL_COMMUNITY): Payer: Self-pay

## 2024-07-27 ENCOUNTER — Other Ambulatory Visit (HOSPITAL_COMMUNITY): Payer: Self-pay

## 2024-07-27 MED ORDER — FLUCONAZOLE 150 MG PO TABS
150.0000 mg | ORAL_TABLET | Freq: Every day | ORAL | 0 refills | Status: AC
  Filled 2024-07-27: qty 14, 14d supply, fill #0

## 2024-08-03 ENCOUNTER — Other Ambulatory Visit (HOSPITAL_COMMUNITY): Payer: Self-pay

## 2024-08-03 MED ORDER — OXYCODONE-ACETAMINOPHEN 5-325 MG PO TABS
1.0000 | ORAL_TABLET | Freq: Three times a day (TID) | ORAL | 0 refills | Status: AC | PRN
  Filled 2024-08-03: qty 90, 30d supply, fill #0

## 2024-08-06 ENCOUNTER — Other Ambulatory Visit: Payer: Self-pay

## 2024-09-02 ENCOUNTER — Other Ambulatory Visit (HOSPITAL_COMMUNITY): Payer: Self-pay

## 2024-09-04 ENCOUNTER — Other Ambulatory Visit (HOSPITAL_COMMUNITY): Payer: Self-pay

## 2024-09-04 MED ORDER — OXYCODONE-ACETAMINOPHEN 5-325 MG PO TABS
1.0000 | ORAL_TABLET | Freq: Three times a day (TID) | ORAL | 0 refills | Status: AC
  Filled 2024-09-04: qty 90, 30d supply, fill #0

## 2024-09-10 ENCOUNTER — Inpatient Hospital Stay: Admitting: *Deleted
